# Patient Record
Sex: Male | Born: 1940 | Race: White | Hispanic: No | Marital: Married | Smoking: Current some day smoker
Health system: Southern US, Community
[De-identification: ages and names within clinical notes are randomized; demographics above are authoritative.]

## PROBLEM LIST (undated history)

## (undated) DIAGNOSIS — Z720 Tobacco use: Secondary | ICD-10-CM

## (undated) DIAGNOSIS — G4730 Sleep apnea, unspecified: Secondary | ICD-10-CM

## (undated) DIAGNOSIS — Z951 Presence of aortocoronary bypass graft: Secondary | ICD-10-CM

## (undated) DIAGNOSIS — E119 Type 2 diabetes mellitus without complications: Secondary | ICD-10-CM

## (undated) DIAGNOSIS — F17200 Nicotine dependence, unspecified, uncomplicated: Secondary | ICD-10-CM

## (undated) DIAGNOSIS — I10 Essential (primary) hypertension: Secondary | ICD-10-CM

## (undated) DIAGNOSIS — C801 Malignant (primary) neoplasm, unspecified: Secondary | ICD-10-CM

## (undated) DIAGNOSIS — I480 Paroxysmal atrial fibrillation: Secondary | ICD-10-CM

## (undated) DIAGNOSIS — I4510 Unspecified right bundle-branch block: Secondary | ICD-10-CM

## (undated) HISTORY — DX: Unspecified right bundle-branch block: I45.10

## (undated) HISTORY — DX: Paroxysmal atrial fibrillation: I48.0

---

## 2007-05-21 ENCOUNTER — Ambulatory Visit (HOSPITAL_COMMUNITY): Admission: RE | Admit: 2007-05-21 | Discharge: 2007-05-22 | Payer: Self-pay | Admitting: Ophthalmology

## 2010-09-27 NOTE — Op Note (Signed)
NAME:  CARR, SHARTZER              ACCOUNT NO.:  [id number]   MEDICAL RECORD NO.:  [id number]          PATIENT TYPE:  AMB   LOCATION:  SDS                          FACILITY:  [facility]   PHYSICIAN:  John D. Ashley Royalty, M.D. DATE OF BIRTH:  01-02-1941   DATE OF PROCEDURE:  05/21/2007  DATE OF DISCHARGE:                               OPERATIVE REPORT   ADMISSION DIAGNOSIS:  Rhegmatogenous retinal detachment, right eye.   PROCEDURE:  1. Scleral buckle, right eye.  2. Gas injection, right eye.  3. Panretinal photocoagulation, right eye.   SURGEON:  Alan Mulder, MD.   ASSISTANT:  Rosalie Doctor, MA.   ANESTHESIA:  General.   DETAILS:  Usual prep and drape, 360 degree limbal peritomy, isolation of  four rectus muscles on #2-0 silk.  Localization of break at 8:30  o'clock.  Scleral dissection for 360 degrees to admit a #279  intrascleral implant, diathermy placed in the bed.  The #279 implant was  placed against the globe.  Additional posterior dissection was carried  out at 8:30 o'clock.  The 240 band was placed around the globe with a  270 sleeve at 2 o'clock, the buckle was shortened.  Perforation starting  at 9 o'clock revealed a large amount of clear colorless subretinal fluid  coming forth.  The perfluoropropane 0.6 mL 100% was injected into the  vitreous cavity.  The buckle was adjusted and trimmed, the band was  adjusted and trimmed,  the scleral flaps were closed, the sutures  knotted and the free ends removed.  Indirect ophthalmoscopy showed the  retina to be lying nicely on the scleral buckle with the break well  approximated.  The indirect ophthalmoscope placed was moved into place,  540 burns placed around the retinal break and around the retinal  periphery with a power of 600 milliwatts, 1000 microns each and 0.1  seconds each.  The conjunctiva was reposited with #7-0 chromic suture,  polymyxin and gentamicin were irrigated into tenon space.  Atropine  solution was applied.   Marcaine was injected around the globe for postop  pain.  Decadron 10 mg was injected into the lower subconjunctival space.  TobraDex ophthalmic ointment, a patch and shield were placed.  Closing  pressure was 20 with a Barraquer tonometer.   COMPLICATIONS:  None.   DURATION:  2 hours.   The patient was awakened, taken to Recovery in satisfactory condition.      Beulah Gandy. Ashley Royalty, M.D.  Electronically Signed     JDM/MEDQ  D:  05/21/2007  T:  05/21/2007  Job:  [id number]

## 2011-02-02 LAB — CBC
HCT: 42.8
Hemoglobin: 14.6
MCHC: 34.2
MCV: 86.2
Platelets: 299
RBC: 4.97
RDW: 14.3
WBC: 11.1 — ABNORMAL HIGH

## 2011-02-02 LAB — BASIC METABOLIC PANEL
BUN: 12
CO2: 28
Calcium: 9.8
Chloride: 99
Creatinine, Ser: 0.89
GFR calc Af Amer: >60
GFR calc non Af Amer: >60
Glucose, Bld: 174 — ABNORMAL HIGH
Potassium: 4
Sodium: 135

## 2011-02-02 LAB — DIFFERENTIAL
Basophils Absolute: 0.1
Basophils Relative: 1
Eosinophils Absolute: 0.1
Eosinophils Relative: 1
Lymphocytes Relative: 20
Lymphs Abs: 2.3
Monocytes Absolute: 0.6
Monocytes Relative: 6
Neutro Abs: 8 — ABNORMAL HIGH
Neutrophils Relative %: 72

## 2011-02-24 ENCOUNTER — Encounter (INDEPENDENT_AMBULATORY_CARE_PROVIDER_SITE_OTHER): Payer: Medicare Other | Admitting: Ophthalmology

## 2011-02-24 DIAGNOSIS — H43819 Vitreous degeneration, unspecified eye: Secondary | ICD-10-CM

## 2011-02-24 DIAGNOSIS — H2510 Age-related nuclear cataract, unspecified eye: Secondary | ICD-10-CM

## 2011-02-24 DIAGNOSIS — H33009 Unspecified retinal detachment with retinal break, unspecified eye: Secondary | ICD-10-CM

## 2012-02-26 ENCOUNTER — Encounter (INDEPENDENT_AMBULATORY_CARE_PROVIDER_SITE_OTHER): Payer: Medicare Other | Admitting: Ophthalmology

## 2012-02-26 DIAGNOSIS — H33009 Unspecified retinal detachment with retinal break, unspecified eye: Secondary | ICD-10-CM

## 2012-02-26 DIAGNOSIS — H43819 Vitreous degeneration, unspecified eye: Secondary | ICD-10-CM

## 2013-02-20 ENCOUNTER — Ambulatory Visit (INDEPENDENT_AMBULATORY_CARE_PROVIDER_SITE_OTHER): Payer: Self-pay | Admitting: Ophthalmology

## 2013-02-26 ENCOUNTER — Ambulatory Visit (INDEPENDENT_AMBULATORY_CARE_PROVIDER_SITE_OTHER): Payer: Medicare Other | Admitting: Ophthalmology

## 2017-03-28 ENCOUNTER — Inpatient Hospital Stay (HOSPITAL_COMMUNITY)
Admission: EM | Admit: 2017-03-28 | Discharge: 2017-04-04 | DRG: 234 | Disposition: A | Discharge status: Home / Self Care | Payer: Medicare Other | Attending: Thoracic Surgery (Cardiothoracic Vascular Surgery) | Admitting: Thoracic Surgery (Cardiothoracic Vascular Surgery)

## 2017-03-28 ENCOUNTER — Other Ambulatory Visit: Payer: Self-pay

## 2017-03-28 ENCOUNTER — Emergency Department (HOSPITAL_COMMUNITY): Payer: Medicare Other

## 2017-03-28 DIAGNOSIS — E785 Hyperlipidemia, unspecified: Secondary | ICD-10-CM | POA: Diagnosis present

## 2017-03-28 DIAGNOSIS — D62 Acute posthemorrhagic anemia: Secondary | ICD-10-CM | POA: Diagnosis not present

## 2017-03-28 DIAGNOSIS — Z7984 Long term (current) use of oral hypoglycemic drugs: Secondary | ICD-10-CM

## 2017-03-28 DIAGNOSIS — I208 Other forms of angina pectoris: Secondary | ICD-10-CM | POA: Diagnosis not present

## 2017-03-28 DIAGNOSIS — R079 Chest pain, unspecified: Secondary | ICD-10-CM | POA: Diagnosis present

## 2017-03-28 DIAGNOSIS — I493 Ventricular premature depolarization: Secondary | ICD-10-CM | POA: Diagnosis not present

## 2017-03-28 DIAGNOSIS — F17210 Nicotine dependence, cigarettes, uncomplicated: Secondary | ICD-10-CM | POA: Diagnosis present

## 2017-03-28 DIAGNOSIS — I4891 Unspecified atrial fibrillation: Secondary | ICD-10-CM | POA: Diagnosis not present

## 2017-03-28 DIAGNOSIS — E119 Type 2 diabetes mellitus without complications: Secondary | ICD-10-CM | POA: Diagnosis not present

## 2017-03-28 DIAGNOSIS — E139 Other specified diabetes mellitus without complications: Secondary | ICD-10-CM | POA: Diagnosis present

## 2017-03-28 DIAGNOSIS — I25119 Atherosclerotic heart disease of native coronary artery with unspecified angina pectoris: Secondary | ICD-10-CM | POA: Diagnosis present

## 2017-03-28 DIAGNOSIS — I10 Essential (primary) hypertension: Secondary | ICD-10-CM | POA: Insufficient documentation

## 2017-03-28 DIAGNOSIS — Z8673 Personal history of transient ischemic attack (TIA), and cerebral infarction without residual deficits: Secondary | ICD-10-CM

## 2017-03-28 DIAGNOSIS — J9811 Atelectasis: Secondary | ICD-10-CM | POA: Diagnosis not present

## 2017-03-28 DIAGNOSIS — I25110 Atherosclerotic heart disease of native coronary artery with unstable angina pectoris: Secondary | ICD-10-CM | POA: Diagnosis not present

## 2017-03-28 DIAGNOSIS — I214 Non-ST elevation (NSTEMI) myocardial infarction: Secondary | ICD-10-CM | POA: Diagnosis present

## 2017-03-28 DIAGNOSIS — E876 Hypokalemia: Secondary | ICD-10-CM | POA: Diagnosis not present

## 2017-03-28 DIAGNOSIS — Z720 Tobacco use: Secondary | ICD-10-CM | POA: Diagnosis present

## 2017-03-28 DIAGNOSIS — Z79899 Other long term (current) drug therapy: Secondary | ICD-10-CM | POA: Diagnosis not present

## 2017-03-28 DIAGNOSIS — Z951 Presence of aortocoronary bypass graft: Secondary | ICD-10-CM

## 2017-03-28 DIAGNOSIS — E8770 Fluid overload, unspecified: Secondary | ICD-10-CM | POA: Diagnosis not present

## 2017-03-28 HISTORY — DX: Type 2 diabetes mellitus without complications: E11.9

## 2017-03-28 HISTORY — DX: Presence of aortocoronary bypass graft: Z95.1

## 2017-03-28 HISTORY — DX: Essential (primary) hypertension: I10

## 2017-03-28 HISTORY — DX: Tobacco use: Z72.0

## 2017-03-28 HISTORY — DX: Nicotine dependence, unspecified, uncomplicated: F17.200

## 2017-03-28 LAB — CBC
HCT: 42.5 % (ref 39.0–52.0)
HCT: 41.3 % (ref 39.0–52.0)
Hemoglobin: 14.6 g/dL (ref 13.0–17.0)
Hemoglobin: 13.8 g/dL (ref 13.0–17.0)
MCH: 29.8 pg (ref 26.0–34.0)
MCH: 29 pg (ref 26.0–34.0)
MCHC: 34.4 g/dL (ref 30.0–36.0)
MCHC: 33.4 g/dL (ref 30.0–36.0)
MCV: 86.7 fL (ref 78.0–100.0)
MCV: 86.8 fL (ref 78.0–100.0)
Platelets: 219 10*3/uL (ref 150–400)
Platelets: 221 10*3/uL (ref 150–400)
RBC: 4.9 MIL/uL (ref 4.22–5.81)
RBC: 4.76 MIL/uL (ref 4.22–5.81)
RDW: 15.3 % (ref 11.5–15.5)
RDW: 15 % (ref 11.5–15.5)
WBC: 10.8 10*3/uL — ABNORMAL HIGH (ref 4.0–10.5)
WBC: 10.1 10*3/uL (ref 4.0–10.5)

## 2017-03-28 LAB — I-STAT TROPONIN, ED
Troponin i, poc: 0.12 ng/mL (ref 0.00–0.08)
Troponin i, poc: 0.98 ng/mL (ref 0.00–0.08)

## 2017-03-28 LAB — CK TOTAL AND CKMB (NOT AT ARMC)
CK, MB: 18.4 ng/mL — ABNORMAL HIGH (ref 0.5–5.0)
Relative Index: 9.6 — ABNORMAL HIGH (ref 0.0–2.5)
Total CK: 191 U/L (ref 49–397)

## 2017-03-28 LAB — BASIC METABOLIC PANEL
Anion gap: 9 (ref 5–15)
BUN: 9 mg/dL (ref 6–20)
CO2: 25 mmol/L (ref 22–32)
Calcium: 9.1 mg/dL (ref 8.9–10.3)
Chloride: 100 mmol/L — ABNORMAL LOW (ref 101–111)
Creatinine, Ser: 0.87 mg/dL (ref 0.61–1.24)
GFR calc Af Amer: >60 mL/min (ref >60)
GFR calc non Af Amer: >60 mL/min (ref >60)
Glucose, Bld: 235 mg/dL — ABNORMAL HIGH (ref 65–99)
Potassium: 3.8 mmol/L (ref 3.5–5.1)
Sodium: 134 mmol/L — ABNORMAL LOW (ref 135–145)

## 2017-03-28 LAB — HEPATIC FUNCTION PANEL
ALT: 17 U/L (ref 17–63)
AST: 26 U/L (ref 15–41)
Albumin: 3.8 g/dL (ref 3.5–5.0)
Alkaline Phosphatase: 81 U/L (ref 38–126)
Bilirubin, Direct: <0.1 mg/dL — ABNORMAL LOW (ref 0.1–0.5)
Total Bilirubin: 0.5 mg/dL (ref 0.3–1.2)
Total Protein: 7.5 g/dL (ref 6.5–8.1)

## 2017-03-28 LAB — BRAIN NATRIURETIC PEPTIDE: B Natriuretic Peptide: 75.4 pg/mL (ref 0.0–100.0)

## 2017-03-28 MED ORDER — ACETAMINOPHEN 325 MG PO TABS: 650.0000 mg | ORAL_TABLET | ORAL | Status: DC | PRN

## 2017-03-28 MED ORDER — ASPIRIN 81 MG PO CHEW: 324.0000 mg | CHEWABLE_TABLET | Freq: Once | ORAL | Status: DC

## 2017-03-28 MED ORDER — HEPARIN BOLUS VIA INFUSION
4000.0000 [IU] | Freq: Once | INTRAVENOUS | Status: AC
  Administered 2017-03-28: 4000 [IU] via INTRAVENOUS
  Filled 2017-03-28: qty 4000

## 2017-03-28 MED ORDER — ASPIRIN 81 MG PO CHEW: 324.0000 mg | CHEWABLE_TABLET | ORAL | Status: DC

## 2017-03-28 MED ORDER — ASPIRIN EC 81 MG PO TBEC: 81.0000 mg | DELAYED_RELEASE_TABLET | Freq: Every day | ORAL | Status: DC

## 2017-03-28 MED ORDER — NITROGLYCERIN 0.4 MG SL SUBL: 0.4000 mg | SUBLINGUAL_TABLET | SUBLINGUAL | Status: DC | PRN

## 2017-03-28 MED ORDER — HEPARIN (PORCINE) IN NACL 100-0.45 UNIT/ML-% IJ SOLN: 16.0000 [IU]/kg/h | INTRAMUSCULAR | Status: DC

## 2017-03-28 MED ORDER — HEPARIN (PORCINE) IN NACL 100-0.45 UNIT/ML-% IJ SOLN
1100.0000 [IU]/h | INTRAMUSCULAR | Status: DC
  Administered 2017-03-28: 1200 [IU]/h via INTRAVENOUS
  Filled 2017-03-28 (×2): qty 250

## 2017-03-28 MED ORDER — ONDANSETRON HCL 4 MG/2ML IJ SOLN: 4.0000 mg | Freq: Four times a day (QID) | INTRAMUSCULAR | Status: DC | PRN

## 2017-03-28 MED ORDER — ASPIRIN 300 MG RE SUPP: 300.0000 mg | RECTAL | Status: DC

## 2017-03-28 NOTE — H&P (Signed)
Cardiology Admission History and Physical:   Patient ID: Bryce Powell; MRN: [id number]; DOB: 06-13-1940   Admission date: 03/28/2017  Primary Care Provider: Suszanne Conners, MD/[facility]  Primary Cardiologist: No primary care provider on file.  Primary Electrophysiologist:  none  Chief Complaint:  CHEST PAIN , ANGINA  Patient Profile:   Bryce Powell is a 76 y.o. male with a history of  DM, HTN , Dyslipidemia, No h/o CAD or PCI in the  Past.   History of Present Illness:   Bryce Powell was  Exerting in the  [employer] [place]  [employer], when he  Started  Having severe chest  Pain, aching  And  Tightness  In quality  , sternal in  Location. Radiating to neck. No c/o  Dyspnea or  Syncope No palpitations His pain was  Present  enroute to ED  And  Was  resolved  With Aspirin . No nitroglycerin was  Given  Initial Point of care troponin  Was 0.12 EKG  Showed  NSR , ST  depressions in  V4 V5. GRACE  Score 100, High  Risk    No past medical history on file. PMH: HTN,   No past surgical history on file. None.  Medications Prior to Admission: Prior to Admission medications   Medication Sig Start Date End Date Taking? Authorizing Provider  amLODipine (NORVASC) 5 MG tablet Take 5 mg daily by mouth.   Yes [provider]  aspirin EC 81 MG tablet Take 81 mg daily by mouth.   Yes [provider]  glipiZIDE (GLUCOTROL) 5 MG tablet Take 5 mg daily by mouth.   Yes [provider]  losartan (COZAAR) 50 MG tablet Take 50 mg daily by mouth.   Yes [provider]  metFORMIN (GLUCOPHAGE) 1000 MG tablet Take 1,000 mg 2 (two) times daily by mouth. 03/01/17  Yes [provider]     Allergies:   No Known Allergies  Social History:   Social History   Socioeconomic History  . Marital status: Married    Spouse name: Not on file  . Number of children: Not on file  . Years of education: Not on file  . Highest education level: Not on  file  Social Needs  . Financial resource strain: Not on file  . Food insecurity - worry: Not on file  . Food insecurity - inability: Not on file  . Transportation needs - medical: Not on file  . Transportation needs - non-medical: Not on file  Occupational History  . Not on file  Tobacco Use  . Smoking status: Not on file  Substance and Sexual Activity  . Alcohol use: Not on file  . Drug use: Not on file  . Sexual activity: Not on file  Other Topics Concern  . Not on file  Social History Narrative  . Not on file    Family History:   The patient's family history is not on file.    ROS:  Please see the history of present illness.  All other ROS reviewed and negative.     Physical Exam/Data:   Vitals:   03/28/17 2030 03/28/17 2115 03/28/17 2145 03/28/17 2230  BP: (!) 155/77 (!) 144/66 (!) 168/75 (!) 155/80  Pulse: 81 73 69   Resp: (!) 21 17 17 19   Temp:      TempSrc:      SpO2: 92% 95% 96%   Weight:      Height:  No intake or output data in the 24 hours ending 03/28/17 2238 Filed Weights   03/28/17 2007  Weight: 222 lb (100.7 kg)   Body mass index is 30.96 kg/m.  General:  Well nourished, well developed, in no acute distress HEENT: normal Lymph: no adenopathy Neck: no INCREASE in  JVD Endocrine:  No thryomegaly Vascular: No carotid bruits; FA pulses 2+ bilaterally without bruits  Cardiac:  normal S1, S2; RRR; MR  Murmur  1+ NOTED at  Left  Sternal border Lungs:  clear to auscultation bilaterally, no wheezing, rhonchi or rales  Abd: soft, nontender, no hepatomegaly  Ext: no  edema Musculoskeletal:  No deformities, BUE and BLE strength normal and equal Skin: warm and dry  Neuro:  CNs 2-12 intact, no focal abnormalities noted Psych:  Normal affect  Mildly diminished breath sounds bilateral bases.  Abdominal: Soft. Bowel sounds are normal. There is no tenderness. There is no rebound and no guarding.  Musculoskeletal: Normal range of motion. He exhibits no  edema or tenderness.  1+ bilateral lower extremity pitting edema.  No midline thoracic or lumbar tenderness.  No CVA tenderness    EKG:  The ECG that was done at  ED  was personally reviewed and demonstrates NSR, ST  DEPRESSIONS IN V4 V5  Relevant CV Studies: EKG  Repeat  Ordered   Laboratory Data:  Chemistry Recent Labs  Lab 03/28/17 2007  NA 134*  K 3.8  CL 100*  CO2 25  GLUCOSE 235*  BUN 9  CREATININE 0.87  CALCIUM 9.1  GFRNONAA >60  GFRAA >60  ANIONGAP 9    No results for input(s): PROT, ALBUMIN, AST, ALT, ALKPHOS, BILITOT in the last 168 hours. Hematology Recent Labs  Lab 03/28/17 2007  WBC 10.8*  RBC 4.90  HGB 14.6  HCT 42.5  MCV 86.7  MCH 29.8  MCHC 34.4  RDW 15.3  PLT 219   Cardiac EnzymesNo results for input(s): TROPONINI in the last 168 hours.  Recent Labs  Lab 03/28/17 2027  TROPIPOC 0.12*    BNP Recent Labs  Lab 03/28/17 2007  BNP 75.4    DDimer No results for input(s): DDIMER in the last 168 hours.  Radiology/Studies:  Dg Chest 2 View  Result Date: 03/28/2017 CLINICAL DATA:  Shortness of breath and chest pain. EXAM: CHEST  2 VIEW COMPARISON:  None. FINDINGS: Cardiomediastinal silhouette is normal. Mediastinal contours appear intact. Calcific atherosclerotic disease and tortuosity of the aorta. There is no evidence of focal airspace consolidation, pleural effusion or pneumothorax. Osseous structures are without acute abnormality. Soft tissues are grossly normal. IMPRESSION: Calcific atherosclerotic disease and tortuosity of the aorta. No pulmonary consolidation or edema. Electronically Signed   By: Fidela Salisbury M.D.   On: 03/28/2017 21:11    Assessment and Plan:   Bryce Powell is a 76 y.o. male with a history of  DM, HTN , Dyslipidemia, No h/o CAD or PCI in the  Past.   1. NSTEMI Initial troponin at 0.12P.  Troponin  Trended up to 1.02 ASA, 324mg , Hep gtt ACS, pharmacy to dose EKG- NSR, ST depressiosn in V4  Subtle non  specific St  Elevation in Inferior  Leads NPO, Nitro S/L PRN ,  Will plan for  Cardiac cath in AM    2. HTN: Uncontrolled,           Added Bet[place]ocker  Therapy , If  Needed  Will use   Hydralazine 10 mg IV PRN   3. Diabetes Mellitus  Uncontrolled, Home  Meds, Metformin held    SS  Sliding  Scale initiated  Severity of Illness: The appropriate patient status for this patient is OBSERVATION. Observation status is judged to be reasonable and necessary in order to provide the required intensity of service to ensure the patient's safety. The patient's presenting symptoms, physical exam findings, and initial radiographic and laboratory data in the context of their medical condition is felt to place them at decreased risk for further clinical deterioration. Furthermore, it is anticipated that the patient will be medically stable for discharge from the hospital within 2 midnights of admission. The following factors support the patient status of observation.   " The patient's presenting symptoms include  angina. " " The initial ekg and laboratory data are  ST   Depressions,  And Elevated  cardaic  enzymes     For questions or updates, please contact [place] Please consult [email or web address] for contact info under Cardiology/STEMI.    Signed, Johna Sheriff, MD  03/28/2017 10:38 PM

## 2017-03-28 NOTE — ED Notes (Signed)
Verified heparin with chris, rn

## 2017-03-28 NOTE — Progress Notes (Signed)
ANTICOAGULATION CONSULT NOTE - Initial Consult  Pharmacy Consult for heparin Indication: chest pain/ACS  No Known Allergies  Patient Measurements: Height: 5\' 11"  (180.3 cm) Weight: 222 lb (100.7 kg) IBW/kg (Calculated) : 75.3 Heparin Dosing Weight: 96.1kg  Vital Signs: Temp: 98.8 F (37.1 C) (11/14 2009) Temp Source: Oral (11/14 2009) BP: 168/75 (11/14 2145) Pulse Rate: 69 (11/14 2145)  Labs: Recent Labs    03/28/17 2007  HGB 14.6  HCT 42.5  PLT 219  CREATININE 0.87    Estimated Creatinine Clearance: 88.7 mL/min (by C-G formula based on SCr of 0.87 mg/dL).   Medical History: No past medical history on file.  Medications:  Infusions:  . heparin      Assessment: 34 yom presented to the ED with CP and SOB. Troponin elevated and now starting IV heparin. Baseline H/H and platelets are WNL. He is not on anticoagulation PTA.   Goal of Therapy:  Heparin level 0.3-0.7 units/ml Monitor platelets by anticoagulation protocol: Yes   Plan:  Heparin bolus 4000 units IV x 1 Heparin gtt 1200 units/hr Check an 8 hr heparin level Daily heparin level and CBC  Jaycelynn Knickerbocker, Rande Lawman 03/28/2017,10:27 PM

## 2017-03-28 NOTE — ED Notes (Signed)
Patient transported to X-ray 

## 2017-03-28 NOTE — ED Triage Notes (Signed)
Per ems pt was at home today s[litting wood and felt some SOB and felt some pain in his chest. He called 911 after about an hour and then said by the time ems arrived he had no pain. Ems gave 324 of aspirin, 500 of fluid. cbg 299 154/84, 88 hr, 16 rr, 97% ra.

## 2017-03-28 NOTE — Assessment & Plan Note (Signed)
Initial troponin at 0.12 EKG- NSR, ST depressiosn in V4  Subtle non specific St  Elevation in Inferior  Leads Heparin gtt NPO, Nitro S/L PRN ,  Will plan for  Cardiac cath in AM

## 2017-03-28 NOTE — ED Provider Notes (Signed)
[facility] CARDIOVASCULAR ICU Provider Note   CSN: [id number] Arrival date & time: 03/28/17  1957     History   Chief Complaint Chief Complaint  Patient presents with  . Chest Pain    HPI Bryce Powell is a 76 y.o. male.  HPI Patient presents with central chest tightness and shortness of breath after splitting wood today.  He called EMS but chest tightness had resolved by the time they arrived.  Was given aspirin in route.  States he has had congestion and productive cough for the last few days.  Denies any fever or chills.  No new lower extremity swelling or pain. Past Medical History:  Diagnosis Date  . Current smoker   . Diabetes ([place])   . HTN (hypertension)     Patient Active Problem List   Diagnosis Date Noted  . NSTEMI (non-ST elevated myocardial infarction) ([place]) 03/28/2017  . HTN (hypertension) 03/28/2017  . 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 Angina at rest [facility]) 03/28/2017    No past surgical history on file.     Home Medications    Prior to Admission medications   Medication Sig Start Date End Date Taking? Authorizing Provider  amLODipine (NORVASC) 5 MG tablet Take 5 mg daily by mouth.   Yes [provider]  aspirin EC 81 MG tablet Take 81 mg daily by mouth.   Yes [provider]  glipiZIDE (GLUCOTROL) 5 MG tablet Take 5 mg daily by mouth.   Yes [provider]  losartan (COZAAR) 50 MG tablet Take 50 mg daily by mouth.   Yes [provider]  metFORMIN (GLUCOPHAGE) 1000 MG tablet Take 1,000 mg 2 (two) times daily by mouth. 03/01/17  Yes [provider]    Family History No family history on file.  Social History Social History   Tobacco Use  . Smoking status: Not on file  Substance Use Topics  . Alcohol use: Not on file  . Drug use: Not on file     Allergies   Patient has no known allergies.   Review of Systems Review of Systems  Constitutional: Negative for chills and  fever.  HENT: Positive for congestion. Negative for sore throat and trouble swallowing.   Respiratory: Positive for cough, chest tightness and shortness of breath.   Cardiovascular: Positive for chest pain. Negative for palpitations and leg swelling.  Gastrointestinal: Negative for abdominal pain, constipation, diarrhea, nausea and vomiting.  Genitourinary: Negative for dysuria, flank pain and frequency.  Musculoskeletal: Negative for back pain, myalgias, neck pain and neck stiffness.  Skin: Negative for rash and wound.  Neurological: Negative for dizziness, weakness, light-headedness, numbness and headaches.  All other systems reviewed and are negative.    Physical Exam Updated Vital Signs BP (!) 152/66   Pulse 63   Temp 97.6 F (36.4 C) (Oral)   Resp 14   Ht 5\' 11"  (1.803 m)   Wt 100.7 kg (222 lb)   SpO2 93%   BMI 30.96 kg/m   Physical Exam  Constitutional: He is oriented to person, place, and time. He appears well-developed and well-nourished.  Non-toxic appearance. He does not appear ill. No distress.  HENT:  Head: Normocephalic and atraumatic.  Mouth/Throat: Oropharynx is clear and moist.  Eyes: EOM are normal. Pupils are equal, round, and reactive to light.  Neck: Normal range of motion. Neck supple.  Cardiovascular: Normal rate and regular rhythm.  Pulmonary/Chest: Effort normal.  Mildly diminished breath sounds bilateral bases.  Abdominal: Soft. Bowel sounds are normal. There is no tenderness. There is no rebound and no guarding.  Musculoskeletal: Normal range of motion. He exhibits no edema or tenderness.  1+ bilateral lower extremity pitting edema.  No midline thoracic or lumbar tenderness.  No CVA tenderness.    Neurological: He is alert and oriented to person, place, and time.  Skin: Skin is warm and dry. No rash noted. No erythema.  Psychiatric: He has a normal mood and affect. His behavior is normal.  Nursing note and vitals reviewed.    ED Treatments /  Results  Labs (all labs ordered are listed, but only abnormal results are displayed) Labs Reviewed  BASIC METABOLIC PANEL - Abnormal; Notable for the following components:      Result Value   Sodium 134 (*)    Chloride 100 (*)    Glucose, Bld 235 (*)    All other components within normal limits  CBC - Abnormal; Notable for the following components:   WBC 10.8 (*)    All other components within normal limits  HEPATIC FUNCTION PANEL - Abnormal; Notable for the following components:   Bilirubin, Direct <0.1 (*)    All other components within normal limits  TROPONIN I - Abnormal; Notable for the following components:   Troponin I 0.88 (*)    All other components within normal limits  CK TOTAL AND CKMB (NOT AT [facility]) - Abnormal; Notable for the following components:   CK, MB 18.4 (*)    Relative Index 9.6 (*)    All other components within normal limits  HEPARIN LEVEL (UNFRACTIONATED) - Abnormal; Notable for the following components:   Heparin Unfractionated 0.71 (*)    All other components within normal limits  BASIC METABOLIC PANEL - Abnormal; Notable for the following components:   Glucose, Bld 182 (*)    All other components within normal limits  TROPONIN I - Abnormal; Notable for the following components:   Troponin I 1.04 (*)    All other components within normal limits  TROPONIN I - Abnormal; Notable for the following components:   Troponin I 1.96 (*)    All other components within normal limits  TROPONIN I - Abnormal; Notable for the following components:   Troponin I 1.42 (*)    All other components within normal limits  GLUCOSE, CAPILLARY - Abnormal; Notable for the following components:   Glucose-Capillary 164 (*)    All other components within normal limits  COMPREHENSIVE METABOLIC PANEL - Abnormal; Notable for the following components:   Glucose, Bld 240 (*)    ALT 16 (*)    All other components within normal limits  HEMOGLOBIN A1C - Abnormal; Notable for the  following components:   Hgb A1c MFr Bld 8.6 (*)    All other components within normal limits  I-STAT TROPONIN, ED - Abnormal; Notable for the following components:   Troponin i, poc 0.12 (*)    All other components within normal limits  CBG MONITORING, ED - Abnormal; Notable for the following components:   Glucose-Capillary 204 (*)    All other components within normal limits  I-STAT TROPONIN, ED - Abnormal; Notable for the following components:   Troponin i, poc 0.98 (*)    All other components within normal limits  SURGICAL PCR SCREEN  BRAIN NATRIURETIC PEPTIDE  CBC  CBC  PROTIME-INR  BLOOD GAS, ARTERIAL  CBC  LIPID PANEL  HEMOGLOBIN A1C  APTT  PROTIME-INR  BASIC METABOLIC PANEL  I-STAT TROPONIN, ED  TYPE AND SCREEN  ABO/RH    EKG  EKG Interpretation  Date/Time:  Wednesday March 28 2017 64:40:34 EST Ventricular Rate:  92 PR Interval:    QRS Duration: 107 QT Interval:  382 QTC Calculation: 473 R Axis:   96 Text Interpretation:  Sinus rhythm Atrial premature complexes Right axis deviation Borderline ST depression, anterolateral leads Confirmed by Julianne Rice [phone]) on 03/28/2017 8:32:42 PM Also confirmed by Julianne Rice [phone]), editor Hattie Perch [phone])  on 03/29/2017 7:08:53 AM       Radiology Dg Chest 2 View  Result Date: 03/28/2017 CLINICAL DATA:  Shortness of breath and chest pain. EXAM: CHEST  2 VIEW COMPARISON:  None. FINDINGS: Cardiomediastinal silhouette is normal. Mediastinal contours appear intact. Calcific atherosclerotic disease and tortuosity of the aorta. There is no evidence of focal airspace consolidation, pleural effusion or pneumothorax. Osseous structures are without acute abnormality. Soft tissues are grossly normal. IMPRESSION: Calcific atherosclerotic disease and tortuosity of the aorta. No pulmonary consolidation or edema. Electronically Signed   By: Fidela Salisbury M.D.   On: 03/28/2017 21:11   Dg Chest Port 1  View  Result Date: 03/29/2017 CLINICAL DATA:  76 year old male with chest pain. EXAM: PORTABLE CHEST 1 VIEW COMPARISON:  Chest radiograph dated 03/28/2017 FINDINGS: The lungs are clear. There is no pleural effusion or pneumothorax. There is borderline cardiomegaly. There is mildly tortuous thoracic aorta with atherosclerotic calcification. No acute osseous pathology. IMPRESSION: 1. No acute cardiopulmonary process. 2. Borderline cardiomegaly. Electronically Signed   By: Anner Crete M.D.   On: 03/29/2017 19:01    Procedures Procedures (including critical care time)  Medications Ordered in ED Medications  amLODipine (NORVASC) tablet 5 mg ( Oral MAR Unhold 03/29/17 1812)  losartan (COZAAR) tablet 50 mg ( Oral MAR Unhold 03/29/17 1812)  0.9 %  sodium chloride infusion ( Intravenous New Bag/Given 03/29/17 0224)  nitroGLYCERIN (NITROSTAT) SL tablet 0.4 mg ( Sublingual MAR Unhold 03/29/17 1812)  insulin aspart (novoLOG) injection 0-15 Units ( Subcutaneous MAR Unhold 03/29/17 1812)  acetaminophen (TYLENOL) tablet 650 mg (not administered)  ondansetron (ZOFRAN) injection 4 mg (not administered)  0.9 %  sodium chloride infusion ( Intravenous Rate/Dose Change 03/29/17 1500)  sodium chloride flush (NS) 0.9 % injection 3 mL (3 mLs Intravenous Not Given 03/29/17 2033)  sodium chloride flush (NS) 0.9 % injection 3 mL (not administered)  0.9 %  sodium chloride infusion (not administered)  morphine 4 MG/ML injection 2 mg (not administered)  atorvastatin (LIPITOR) tablet 80 mg (not administered)  aspirin chewable tablet 81 mg (not administered)  chlorhexidine (PERIDEX) 0.12 % solution 15 mL (not administered)  bisacodyl (DULCOLAX) EC tablet 5 mg (5 mg Oral Not Given 03/29/17 2031)  temazepam (RESTORIL) capsule 15 mg (not administered)  metoprolol tartrate (LOPRESSOR) tablet 12.5 mg (not administered)  chlorhexidine (HIBICLENS) 4 % liquid 4 application (not administered)    And  chlorhexidine  (HIBICLENS) 4 % liquid 4 application (not administered)  heparin ADULT infusion 100 units/mL (25000 units/285mL sodium chloride 0.45%) (1,100 Units/hr Intravenous New Bag/Given 03/29/17 2023)  dexmedetomidine (PRECEDEX) 400 MCG/100ML (4 mcg/mL) infusion (not administered)  insulin regular (NOVOLIN R,HUMULIN R) 100 Units in sodium chloride 0.9 % 100 mL (1 Units/mL) infusion (not administered)  EPINEPHrine (ADRENALIN) 4 mg in dextrose 5 % 250 mL (0.016 mg/mL) infusion (not administered)  DOPamine (INTROPIN) 800 mg in dextrose 5 % 250 mL (3.2 mg/mL) infusion (not administered)  nitroGLYCERIN 50 mg in dextrose 5 % 250 mL (0.2 mg/mL) infusion (not administered)  phenylephrine (NEO-SYNEPHRINE) 20 mg in sodium chloride 0.9 % 250 mL (0.08 mg/mL) infusion (not administered)  heparin 2,500 Units, papaverine 30 mg in electrolyte-148 (PLASMALYTE-148) 500 mL irrigation (not administered)  heparin 30,000 units/NS 1000 mL solution for CELLSAVER (not administered)  potassium chloride injection 80 mEq (not administered)  magnesium sulfate (IV Push/IM) injection 40 mEq (not administered)  tranexamic acid (CYKLOKAPRON) pump prime solution 201 mg (not administered)  tranexamic acid (CYKLOKAPRON) bolus via infusion - over 30 minutes 1,510.5 mg (not administered)  tranexamic acid (CYKLOKAPRON) 2,500 mg in sodium chloride 0.9 % 250 mL (10 mg/mL) infusion (not administered)  vancomycin (VANCOCIN) 1,250 mg in sodium chloride 0.9 % 250 mL IVPB (not administered)  cefUROXime (ZINACEF) 1.5 g in dextrose 5 % 50 mL IVPB (not administered)  cefUROXime (ZINACEF) 750 mg in dextrose 5 % 50 mL IVPB (not administered)  heparin bolus via infusion 4,000 Units (4,000 Units Intravenous Bolus from Bag 03/28/17 2306)  aspirin chewable tablet 324 mg (324 mg Oral Given 03/29/17 0125)  metoprolol tartrate (LOPRESSOR) tablet 12.5 mg ( Oral MAR Unhold 03/29/17 1812)  heparin infusion 2 units/mL in 0.9 % sodium chloride (1,000 mLs Other New  Bag/Given 03/29/17 1426)  going for heart surgery book ( Does not apply Given 03/29/17 2028)     Initial Impression / Assessment and Plan / ED Course  I have reviewed the triage vital signs and the nursing notes.  Pertinent labs & imaging results that were available during my care of the patient were reviewed by me and considered in my medical decision making (see chart for details).     Patient remains chest pain-free.  Discussed with cardiology.  Will admit and plan for catheterization.  Far as starting heparin per pharmacy protocol.  Final Clinical Impressions(s) / ED Diagnoses   Final diagnoses:  Chest pain in adult    ED Discharge Orders        Ordered    AMB Referral to Cardiac Rehabilitation - Phase II     03/29/17 1429       Julianne Rice, MD 03/29/17 2142

## 2017-03-29 ENCOUNTER — Inpatient Hospital Stay (HOSPITAL_COMMUNITY): Payer: Medicare Other

## 2017-03-29 ENCOUNTER — Encounter (HOSPITAL_COMMUNITY)
Admission: EM | Disposition: A | Discharge status: Home / Self Care | Payer: Self-pay | Source: Home / Self Care | Attending: Thoracic Surgery (Cardiothoracic Vascular Surgery)

## 2017-03-29 ENCOUNTER — Encounter (HOSPITAL_COMMUNITY): Payer: Self-pay

## 2017-03-29 DIAGNOSIS — I208 Other forms of angina pectoris: Secondary | ICD-10-CM

## 2017-03-29 DIAGNOSIS — I25110 Atherosclerotic heart disease of native coronary artery with unstable angina pectoris: Secondary | ICD-10-CM

## 2017-03-29 DIAGNOSIS — E785 Hyperlipidemia, unspecified: Secondary | ICD-10-CM

## 2017-03-29 DIAGNOSIS — E119 Type 2 diabetes mellitus without complications: Secondary | ICD-10-CM

## 2017-03-29 DIAGNOSIS — I214 Non-ST elevation (NSTEMI) myocardial infarction: Principal | ICD-10-CM

## 2017-03-29 HISTORY — PX: LEFT HEART CATH AND CORONARY ANGIOGRAPHY: CATH118249

## 2017-03-29 LAB — BASIC METABOLIC PANEL
Anion gap: 9 (ref 5–15)
BUN: 10 mg/dL (ref 6–20)
CO2: 25 mmol/L (ref 22–32)
Calcium: 9.1 mg/dL (ref 8.9–10.3)
Chloride: 102 mmol/L (ref 101–111)
Creatinine, Ser: 0.9 mg/dL (ref 0.61–1.24)
GFR calc Af Amer: >60 mL/min (ref >60)
GFR calc non Af Amer: >60 mL/min (ref >60)
Glucose, Bld: 182 mg/dL — ABNORMAL HIGH (ref 65–99)
Potassium: 4 mmol/L (ref 3.5–5.1)
Sodium: 136 mmol/L (ref 135–145)

## 2017-03-29 LAB — CBC
HCT: 41.2 % (ref 39.0–52.0)
Hemoglobin: 13.8 g/dL (ref 13.0–17.0)
MCH: 28.9 pg (ref 26.0–34.0)
MCHC: 33.5 g/dL (ref 30.0–36.0)
MCV: 86.4 fL (ref 78.0–100.0)
Platelets: 203 10*3/uL (ref 150–400)
RBC: 4.77 MIL/uL (ref 4.22–5.81)
RDW: 15.1 % (ref 11.5–15.5)
WBC: 10 10*3/uL (ref 4.0–10.5)

## 2017-03-29 LAB — COMPREHENSIVE METABOLIC PANEL
ALT: 16 U/L — ABNORMAL LOW (ref 17–63)
AST: 24 U/L (ref 15–41)
Albumin: 3.5 g/dL (ref 3.5–5.0)
Alkaline Phosphatase: 71 U/L (ref 38–126)
Anion gap: 6 (ref 5–15)
BUN: 7 mg/dL (ref 6–20)
CO2: 27 mmol/L (ref 22–32)
Calcium: 8.9 mg/dL (ref 8.9–10.3)
Chloride: 103 mmol/L (ref 101–111)
Creatinine, Ser: 0.88 mg/dL (ref 0.61–1.24)
GFR calc Af Amer: >60 mL/min (ref >60)
GFR calc non Af Amer: >60 mL/min (ref >60)
Glucose, Bld: 240 mg/dL — ABNORMAL HIGH (ref 65–99)
Potassium: 3.7 mmol/L (ref 3.5–5.1)
Sodium: 136 mmol/L (ref 135–145)
Total Bilirubin: 0.4 mg/dL (ref 0.3–1.2)
Total Protein: 7 g/dL (ref 6.5–8.1)

## 2017-03-29 LAB — TYPE AND SCREEN
ABO/RH(D): O POS
Antibody Screen: NEGATIVE

## 2017-03-29 LAB — HEMOGLOBIN A1C
Hgb A1c MFr Bld: 8.6 % — ABNORMAL HIGH (ref 4.8–5.6)
Mean Plasma Glucose: 200.12 mg/dL

## 2017-03-29 LAB — GLUCOSE, CAPILLARY
Glucose-Capillary: 164 mg/dL — ABNORMAL HIGH (ref 65–99)
Glucose-Capillary: 237 mg/dL — ABNORMAL HIGH (ref 65–99)

## 2017-03-29 LAB — PROTIME-INR
INR: 1.1
Prothrombin Time: 14.1 seconds (ref 11.4–15.2)

## 2017-03-29 LAB — TROPONIN I
Troponin I: 0.88 ng/mL (ref <0.03)
Troponin I: 1.04 ng/mL (ref <0.03)
Troponin I: 1.96 ng/mL (ref <0.03)
Troponin I: 1.42 ng/mL (ref <0.03)

## 2017-03-29 LAB — HEPARIN LEVEL (UNFRACTIONATED): Heparin Unfractionated: 0.71 IU/mL — ABNORMAL HIGH (ref 0.30–0.70)

## 2017-03-29 LAB — ABO/RH: ABO/RH(D): O POS

## 2017-03-29 LAB — SURGICAL PCR SCREEN
MRSA, PCR: NEGATIVE
Staphylococcus aureus: POSITIVE — AB

## 2017-03-29 LAB — CBG MONITORING, ED: Glucose-Capillary: 204 mg/dL — ABNORMAL HIGH (ref 65–99)

## 2017-03-29 SURGERY — LEFT HEART CATH AND CORONARY ANGIOGRAPHY
Anesthesia: LOCAL

## 2017-03-29 MED ORDER — TRANEXAMIC ACID (OHS) PUMP PRIME SOLUTION
2.0000 mg/kg | INTRAVENOUS | Status: DC
  Filled 2017-03-29: qty 2.01

## 2017-03-29 MED ORDER — VERAPAMIL HCL 2.5 MG/ML IV SOLN
INTRAVENOUS | Status: AC
  Filled 2017-03-29: qty 2

## 2017-03-29 MED ORDER — ONDANSETRON HCL 4 MG/2ML IJ SOLN: 4.0000 mg | Freq: Four times a day (QID) | INTRAMUSCULAR | Status: DC | PRN

## 2017-03-29 MED ORDER — ~~LOC~~ CARDIAC SURGERY, PATIENT & FAMILY EDUCATION
Freq: Once | Status: AC
  Administered 2017-03-29: 20:00:00
  Filled 2017-03-29: qty 1

## 2017-03-29 MED ORDER — SODIUM CHLORIDE 0.9 % IV SOLN
INTRAVENOUS | Status: DC
  Administered 2017-03-29 – 2017-03-30 (×3): via INTRAVENOUS

## 2017-03-29 MED ORDER — HEPARIN SODIUM (PORCINE) 1000 UNIT/ML IJ SOLN
INTRAMUSCULAR | Status: DC | PRN
  Administered 2017-03-29: 5000 [IU] via INTRAVENOUS

## 2017-03-29 MED ORDER — ASPIRIN 81 MG PO CHEW
324.0000 mg | CHEWABLE_TABLET | Freq: Once | ORAL | Status: AC
  Administered 2017-03-29: 324 mg via ORAL
  Filled 2017-03-29: qty 4

## 2017-03-29 MED ORDER — ASPIRIN 81 MG PO CHEW: 81.0000 mg | CHEWABLE_TABLET | Freq: Every day | ORAL | Status: DC

## 2017-03-29 MED ORDER — HEPARIN (PORCINE) IN NACL 2-0.9 UNIT/ML-% IJ SOLN
INTRAMUSCULAR | Status: AC | PRN
  Administered 2017-03-29: 1000 mL

## 2017-03-29 MED ORDER — NITROGLYCERIN 1 MG/10 ML FOR IR/CATH LAB
INTRA_ARTERIAL | Status: AC
  Filled 2017-03-29: qty 10

## 2017-03-29 MED ORDER — CHLORHEXIDINE GLUCONATE 4 % EX LIQD
60.0000 mL | Freq: Once | CUTANEOUS | Status: AC
  Administered 2017-03-30: 4 via TOPICAL
  Filled 2017-03-29: qty 15

## 2017-03-29 MED ORDER — HEPARIN (PORCINE) IN NACL 100-0.45 UNIT/ML-% IJ SOLN
1100.0000 [IU]/h | INTRAMUSCULAR | Status: DC
  Administered 2017-03-29: 1100 [IU]/h via INTRAVENOUS

## 2017-03-29 MED ORDER — HEPARIN (PORCINE) IN NACL 2-0.9 UNIT/ML-% IJ SOLN
INTRAMUSCULAR | Status: AC
  Filled 2017-03-29: qty 1000

## 2017-03-29 MED ORDER — ASPIRIN 325 MG PO TABS: 325.0000 mg | ORAL_TABLET | Freq: Every day | ORAL | Status: DC

## 2017-03-29 MED ORDER — CHLORHEXIDINE GLUCONATE 0.12 % MT SOLN
15.0000 mL | Freq: Once | OROMUCOSAL | Status: AC
  Administered 2017-03-30: 15 mL via OROMUCOSAL
  Filled 2017-03-29: qty 15

## 2017-03-29 MED ORDER — LIDOCAINE HCL (PF) 1 % IJ SOLN
INTRAMUSCULAR | Status: DC | PRN
  Administered 2017-03-29: 2 mL

## 2017-03-29 MED ORDER — DOPAMINE-DEXTROSE 3.2-5 MG/ML-% IV SOLN
0.0000 ug/kg/min | INTRAVENOUS | Status: DC
  Filled 2017-03-29: qty 250

## 2017-03-29 MED ORDER — VERAPAMIL HCL 2.5 MG/ML IV SOLN
INTRAVENOUS | Status: DC | PRN
  Administered 2017-03-29: 10 mL via INTRA_ARTERIAL

## 2017-03-29 MED ORDER — AMLODIPINE BESYLATE 5 MG PO TABS
5.0000 mg | ORAL_TABLET | Freq: Every day | ORAL | Status: DC
  Administered 2017-03-29: 5 mg via ORAL
  Filled 2017-03-29: qty 1

## 2017-03-29 MED ORDER — SODIUM CHLORIDE 0.9% FLUSH
3.0000 mL | Freq: Two times a day (BID) | INTRAVENOUS | Status: DC
  Administered 2017-03-29: 3 mL via INTRAVENOUS

## 2017-03-29 MED ORDER — ATORVASTATIN CALCIUM 80 MG PO TABS: 80.0000 mg | ORAL_TABLET | Freq: Every day | ORAL | Status: DC

## 2017-03-29 MED ORDER — ATORVASTATIN CALCIUM 80 MG PO TABS
80.0000 mg | ORAL_TABLET | Freq: Every day | ORAL | Status: DC
  Filled 2017-03-29: qty 1

## 2017-03-29 MED ORDER — CEFUROXIME SODIUM 1.5 G IV SOLR
1.5000 g | INTRAVENOUS | Status: AC
  Administered 2017-03-30: 1.5 g via INTRAVENOUS
  Administered 2017-03-30: .75 g via INTRAVENOUS
  Filled 2017-03-29: qty 1.5

## 2017-03-29 MED ORDER — TRANEXAMIC ACID (OHS) BOLUS VIA INFUSION
15.0000 mg/kg | INTRAVENOUS | Status: AC
  Administered 2017-03-30: 1510.5 mg via INTRAVENOUS
  Filled 2017-03-29: qty 1511

## 2017-03-29 MED ORDER — HEPARIN SODIUM (PORCINE) 1000 UNIT/ML IJ SOLN
INTRAMUSCULAR | Status: AC
  Filled 2017-03-29: qty 1

## 2017-03-29 MED ORDER — SODIUM CHLORIDE 0.9% FLUSH: 3.0000 mL | INTRAVENOUS | Status: DC | PRN

## 2017-03-29 MED ORDER — SODIUM CHLORIDE 0.9 % IV SOLN
INTRAVENOUS | Status: DC
  Filled 2017-03-29: qty 30

## 2017-03-29 MED ORDER — NITROGLYCERIN 0.4 MG SL SUBL: 0.4000 mg | SUBLINGUAL_TABLET | SUBLINGUAL | Status: DC | PRN

## 2017-03-29 MED ORDER — SODIUM CHLORIDE 0.9 % IV SOLN: INTRAVENOUS | Status: AC

## 2017-03-29 MED ORDER — ASPIRIN EC 325 MG PO TBEC: 325.0000 mg | DELAYED_RELEASE_TABLET | Freq: Every day | ORAL | Status: DC

## 2017-03-29 MED ORDER — SODIUM CHLORIDE 0.9 % IV SOLN
30.0000 ug/min | INTRAVENOUS | Status: DC
  Filled 2017-03-29: qty 2

## 2017-03-29 MED ORDER — LIDOCAINE HCL (PF) 1 % IJ SOLN
INTRAMUSCULAR | Status: AC
  Filled 2017-03-29: qty 30

## 2017-03-29 MED ORDER — SODIUM CHLORIDE 0.9 % IV SOLN
INTRAVENOUS | Status: AC
  Administered 2017-03-30: 2.4 [IU]/h via INTRAVENOUS
  Filled 2017-03-29: qty 1

## 2017-03-29 MED ORDER — VANCOMYCIN HCL 10 G IV SOLR
1250.0000 mg | INTRAVENOUS | Status: AC
  Administered 2017-03-30: 1250 mg via INTRAVENOUS
  Filled 2017-03-29 (×2): qty 1250

## 2017-03-29 MED ORDER — SODIUM CHLORIDE 0.9% FLUSH: 3.0000 mL | Freq: Two times a day (BID) | INTRAVENOUS | Status: DC

## 2017-03-29 MED ORDER — BISACODYL 5 MG PO TBEC: 5.0000 mg | DELAYED_RELEASE_TABLET | Freq: Once | ORAL | Status: DC

## 2017-03-29 MED ORDER — METOPROLOL TARTRATE 25 MG PO TABS
12.5000 mg | ORAL_TABLET | Freq: Once | ORAL | Status: AC
  Administered 2017-03-29: 12.5 mg via ORAL
  Filled 2017-03-29: qty 1
  Filled 2017-03-29: qty 0.5

## 2017-03-29 MED ORDER — IOPAMIDOL (ISOVUE-370) INJECTION 76%
INTRAVENOUS | Status: DC | PRN
  Administered 2017-03-29: 50 mL via INTRA_ARTERIAL

## 2017-03-29 MED ORDER — EPINEPHRINE PF 1 MG/ML IJ SOLN
0.0000 ug/min | INTRAVENOUS | Status: DC
  Filled 2017-03-29: qty 4

## 2017-03-29 MED ORDER — ACETAMINOPHEN 325 MG PO TABS: 650.0000 mg | ORAL_TABLET | ORAL | Status: DC | PRN

## 2017-03-29 MED ORDER — POTASSIUM CHLORIDE 2 MEQ/ML IV SOLN
80.0000 meq | INTRAVENOUS | Status: DC
  Filled 2017-03-29: qty 40

## 2017-03-29 MED ORDER — MAGNESIUM SULFATE 50 % IJ SOLN
40.0000 meq | INTRAMUSCULAR | Status: DC
  Filled 2017-03-29: qty 9.85

## 2017-03-29 MED ORDER — LOSARTAN POTASSIUM 50 MG PO TABS
50.0000 mg | ORAL_TABLET | Freq: Every day | ORAL | Status: DC
  Administered 2017-03-29: 50 mg via ORAL
  Filled 2017-03-29 (×2): qty 1

## 2017-03-29 MED ORDER — CEFUROXIME SODIUM 750 MG IJ SOLR
750.0000 mg | INTRAMUSCULAR | Status: DC
  Filled 2017-03-29: qty 750

## 2017-03-29 MED ORDER — SODIUM CHLORIDE 0.9 % IV SOLN: 250.0000 mL | INTRAVENOUS | Status: DC | PRN

## 2017-03-29 MED ORDER — IOPAMIDOL (ISOVUE-370) INJECTION 76%
INTRAVENOUS | Status: AC
  Filled 2017-03-29: qty 100

## 2017-03-29 MED ORDER — PAPAVERINE HCL 30 MG/ML IJ SOLN
INTRAMUSCULAR | Status: AC
  Administered 2017-03-30: 500 mL
  Filled 2017-03-29: qty 2.5

## 2017-03-29 MED ORDER — METOPROLOL TARTRATE 25 MG PO TABS
25.0000 mg | ORAL_TABLET | Freq: Once | ORAL | Status: DC
  Filled 2017-03-29: qty 1

## 2017-03-29 MED ORDER — INSULIN ASPART 100 UNIT/ML ~~LOC~~ SOLN: 0.0000 [IU] | Freq: Three times a day (TID) | SUBCUTANEOUS | Status: DC

## 2017-03-29 MED ORDER — METOPROLOL TARTRATE 12.5 MG HALF TABLET
12.5000 mg | ORAL_TABLET | Freq: Once | ORAL | Status: AC
  Administered 2017-03-30: 12.5 mg via ORAL
  Filled 2017-03-29: qty 1

## 2017-03-29 MED ORDER — MORPHINE SULFATE (PF) 4 MG/ML IV SOLN: 2.0000 mg | INTRAVENOUS | Status: DC | PRN

## 2017-03-29 MED ORDER — TRANEXAMIC ACID 1000 MG/10ML IV SOLN
1.5000 mg/kg/h | INTRAVENOUS | Status: AC
  Administered 2017-03-30: 1.5 mg/kg/h via INTRAVENOUS
  Filled 2017-03-29: qty 25

## 2017-03-29 MED ORDER — SODIUM CHLORIDE 0.9 % WEIGHT BASED INFUSION
1.0000 mL/kg/h | INTRAVENOUS | Status: DC
  Administered 2017-03-29: 1 mL/kg/h via INTRAVENOUS

## 2017-03-29 MED ORDER — SODIUM CHLORIDE 0.9 % WEIGHT BASED INFUSION
3.0000 mL/kg/h | INTRAVENOUS | Status: DC
  Administered 2017-03-29: 3 mL/kg/h via INTRAVENOUS

## 2017-03-29 MED ORDER — HEPARIN (PORCINE) IN NACL 100-0.45 UNIT/ML-% IJ SOLN
1100.0000 [IU]/h | INTRAMUSCULAR | Status: DC
  Filled 2017-03-29: qty 250

## 2017-03-29 MED ORDER — DEXMEDETOMIDINE HCL IN NACL 400 MCG/100ML IV SOLN
0.1000 ug/kg/h | INTRAVENOUS | Status: AC
  Administered 2017-03-30: .2 ug/kg/h via INTRAVENOUS
  Filled 2017-03-29: qty 100

## 2017-03-29 MED ORDER — NITROGLYCERIN IN D5W 200-5 MCG/ML-% IV SOLN
2.0000 ug/min | INTRAVENOUS | Status: AC
  Administered 2017-03-30: 10 ug/min via INTRAVENOUS
  Filled 2017-03-29: qty 250

## 2017-03-29 MED ORDER — TEMAZEPAM 15 MG PO CAPS: 15.0000 mg | ORAL_CAPSULE | Freq: Once | ORAL | Status: DC | PRN

## 2017-03-29 SURGICAL SUPPLY — 12 items

## 2017-03-29 NOTE — Consult Note (Signed)
[address]       [place] [address]             [phone]        Dymir K[facility] [place] Medical Record #[id number] Date of Birth: 12/25/1940  Referring: Lorretta Harp, MD Primary Cardiologist: Belva Crome, MD (new) Primary Care: Suszanne Conners, MD  Chief Complaint:    Chief Complaint  Patient presents with  . Chest Pain    History of Present Illness: Mr. Bryce Powell is a 76 year old male patient with past medical history of hypertension, tobacco dependence (smoker for 60 years at 2 packs a day), diabetes mellitus type 1, and dyslipidemia who presented to the emergency department with severe chest pain which radiated to his neck. He has had shortness of breath for the last year but when he was checked out by a physician they told him he was heathy.  He has had no palpitations associated with pain.  He was splitting wood yesterday when the chest tightness occurred. An EKG in the ED was obtained which showed slight ST depression in the lateral leads.  A cardiac catheterization was done today which showed multivessel coronary artery disease and a left ventricular ejection fraction of 50-55%.  The patient is retired but works on cars for fun. He was in the backyard splitting wood when the chest pain began. He does seem pretty active although his shortness of breath has slowed him down over the past year.   Current Activity/ Functional Status: Patient was independent with mobility/ambulation, transfers, ADL's, IADL's.   Zubrod Score: At the time of surgery this patient's most appropriate activity status/level should be described as: []     0    Normal activity, no symptoms [x]     1    Restricted in physical strenuous activity but ambulatory, able to do out light work []     2    Ambulatory and capable of self care, unable to do work activities, up and about                 more than 50%  Of the time                            []     3    Only limited  self care, in bed greater than 50% of waking hours []     4    Completely disabled, no self care, confined to bed or chair []     5    Moribund  No past medical history on file.  No past surgical history on file.  Social History   Tobacco Use  Smoking Status Not on file    Social History   Substance and Sexual Activity  Alcohol Use Not on file    Social History   Socioeconomic History  . Marital status: Married    Spouse name: Not on file  . Number of children: Not on file  . Years of education: Not on file  . Highest education level: Not on file  Social Needs  . Financial resource strain: Not on file  . Food insecurity - worry: Not on file  . Food insecurity - inability: Not on file  . Transportation needs - medical: Not on file  . Transportation needs - non-medical: Not on file  Occupational History  . Not on file  Tobacco Use  . Smoking status: Not on file  Substance  and Sexual Activity  . Alcohol use: Not on file  . Drug use: Not on file  . Sexual activity: Not on file  Other Topics Concern  . Not on file  Social History Narrative  . Not on file    No Known Allergies  Current Facility-Administered Medications  Medication Dose Route Frequency Provider Last Rate Last Dose  . 0.9 %  sodium chloride infusion   Intravenous Continuous Johna Sheriff, MD 10 mL/hr at 03/29/17 0224    . 0.9 %  sodium chloride infusion  250 mL Intravenous PRN Martinique, Peter M, MD      . 0.9 %  sodium chloride infusion   Intravenous Continuous Lorretta Harp, MD      . 0.9% sodium chloride infusion  1 mL/kg/hr Intravenous Continuous [place], Peter M, MD 100.7 mL/hr at 03/29/17 1158 1 mL/kg/hr at 03/29/17 1158  . [MAR Hold] acetaminophen (TYLENOL) tablet 650 mg  650 mg Oral Q4H PRN Johna Sheriff, MD      . Doug Sou Hold] amLODipine (NORVASC) tablet 5 mg  5 mg Oral Daily Teena Dunk, Ameeth, MD   5 mg at 03/29/17 1046  . [MAR Hold] aspirin chewable tablet 81 mg  81 mg Oral Daily Cheryln Manly, NP      . Doug Sou Hold] atorvastatin (LIPITOR) tablet 80 mg  80 mg Oral q1800 Johna Sheriff, MD      . Doug Sou Hold] insulin aspart (novoLOG) injection 0-15 Units  0-15 Units Subcutaneous TID WC Cheryln Manly, NP      . Doug Sou Hold] losartan (COZAAR) tablet 50 mg  50 mg Oral Daily Johna Sheriff, MD      . Doug Sou Hold] metoprolol tartrate (LOPRESSOR) tablet 12.5 mg  12.5 mg Oral Once Johna Sheriff, MD      . Doug Sou Hold] nitroGLYCERIN (NITROSTAT) SL tablet 0.4 mg  0.4 mg Sublingual Q5 Min x 3 PRN Johna Sheriff, MD      . Doug Sou Hold] ondansetron [facility]) injection 4 mg  4 mg Intravenous Q6H PRN Vedre, Ameeth, MD      . sodium chloride flush (NS) 0.9 % injection 3 mL  3 mL Intravenous Q12H Martinique, Peter M, MD   3 mL at 03/29/17 1044  . sodium chloride flush (NS) 0.9 % injection 3 mL  3 mL Intravenous PRN Martinique, Peter M, MD        Medications Prior to Admission  Medication Sig Dispense Refill Last Dose  . amLODipine (NORVASC) 5 MG tablet Take 5 mg daily by mouth.   03/28/2017 at Unknown time  . aspirin EC 81 MG tablet Take 81 mg daily by mouth.   03/28/2017 at Unknown time  . glipiZIDE (GLUCOTROL) 5 MG tablet Take 5 mg daily by mouth.   03/28/2017 at Unknown time  . losartan (COZAAR) 50 MG tablet Take 50 mg daily by mouth.   03/28/2017 at Unknown time  . metFORMIN (GLUCOPHAGE) 1000 MG tablet Take 1,000 mg 2 (two) times daily by mouth.   03/28/2017 at Unknown time    No family history on file.   Review of Systems:  Pertinent items are noted in HPI.     Cardiac Review of Systems: Y or N  Chest Pain [ Y   ]  Resting SOB Aqua.Slicker ] Exertional SOB  [ Y ]  Orthopnea [  ]   Pedal Edema [ Y  ]    Palpitations [ N ] Syncope  [  ]   Presyncope [   ]  General Review of Systems: [Y] = yes [  ]=no Constitional: recent weight change [  ]; anorexia [  ]; fatigue [  ]; nausea [ N ]; night sweats [  ]; fever [ N ]; or chills [  ]                                                               Dental: poor dentition[   ]; Last Dentist visit:   Eye : blurred vision [  ]; diplopia [   ]; vision changes [  ];  Amaurosis fugax[  ]; Resp: cough Aqua.Slicker  ];  wheezing[ Y ];  hemoptysis[  ]; shortness of breath[Y  ]; paroxysmal nocturnal dyspnea[  ]; dyspnea on exertion[  Y]; or orthopnea[  ];  GI:  gallstones[  ], vomiting[N  ];  dysphagia[ N ]; melena[  ];  hematochezia [  ]; heartburn[  ];   Hx of  Colonoscopy[  ]; GU: kidney stones [  ]; hematuria[  ];   dysuria [  ];  nocturia[  ];  history of  obstruction [ N ]; urinary frequency [  ]             Skin: rash, swelling[  ];, hair loss[  ];  peripheral edema[ Y ];  or itching[  ]; Musculosketetal: myalgias[  ];  joint swelling[  ];  joint erythema[  ];  joint pain[  ];  back pain[  ];  Heme/Lymph: bruising[  ];  bleeding[  ];  anemia[  ];  Neuro: TIA[  ];  headaches[  ];  stroke[N  ];  vertigo[  ];  seizures[  ];   paresthesias[  ];  difficulty walking[  ];  Psych:depression[ N ]; anxiety[ N ];  Endocrine: diabetes[ Y ];  thyroid dysfunction[ N ];  Immunizations: Flu [  ]; Pneumococcal[  ];  Other:  Physical Exam: BP (!) 166/85   Pulse 73   Temp 98.8 F (37.1 C) (Oral)   Resp 18   Ht 5\' 11"  (1.803 m)   Wt 222 lb (100.7 kg)   SpO2 91%   BMI 30.96 kg/m    General appearance: alert, cooperative and no distress Resp: wheezes bilateral lobes Cardio: regular rate and rhythm, S1, S2 normal, no murmur, click, rub or gallop GI: soft, non-tender; bowel sounds normal; no masses,  no organomegaly Extremities: 1+ non pitting pedal edema    Cardiac Cath 03/29/2017:   Ost LM to Mid LM lesion is 90% stenosed.  Ost Cx lesion is 90% stenosed.  Ost LAD lesion is 70% stenosed.  Prox LAD lesion is 90% stenosed.  Prox Cx to Mid Cx lesion is 95% stenosed.  Prox RCA to Mid RCA lesion is 95% stenosed.  The left ventricular systolic function is normal.  LV end diastolic pressure is normal.  The left ventricular ejection fraction is 50-55% by visual estimate.       Recent Radiology Findings:   Dg Chest 2 View  Result Date: 03/28/2017 CLINICAL DATA:  Shortness of breath and chest pain. EXAM: CHEST  2 VIEW COMPARISON:  None. FINDINGS: Cardiomediastinal silhouette is normal. Mediastinal contours appear intact. Calcific atherosclerotic disease and tortuosity of the aorta. There is no evidence of focal airspace consolidation, pleural effusion or pneumothorax.  Osseous structures are without acute abnormality. Soft tissues are grossly normal. IMPRESSION: Calcific atherosclerotic disease and tortuosity of the aorta. No pulmonary consolidation or edema. Electronically Signed   By: Fidela Salisbury M.D.   On: 03/28/2017 21:11     I have independently reviewed the above radiologic studies.  Recent Lab Findings: Lab Results  Component Value Date   WBC 10.0 03/29/2017   HGB 13.8 03/29/2017   HCT 41.2 03/29/2017   PLT 203 03/29/2017   GLUCOSE 182 (H) 03/28/2017   ALT 17 03/28/2017   AST 26 03/28/2017   NA 136 03/28/2017   K 4.0 03/28/2017   CL 102 03/28/2017   CREATININE 0.90 03/28/2017   BUN 10 03/28/2017   CO2 25 03/28/2017   INR 1.10 03/29/2017      Assessment / Plan:    He will require an echocardiogram as a part of his surgical workup. May also need saphenous vein mapping. The procedure was explained to the patient and the family at the bedside in great detail. All questions were answered to their satisfaction.     Nicholes Rough, PA-C 03/29/2017 3:10 PM  I have seen and examined the patient and agree with the assessment and as outlined.  Patient is a 76 year old male with no previous cardiac history but risk factors notable for history of hypertension, type 2 diabetes mellitus, and long-standing tobacco abuse who describes greater than 1 year history of progressive symptoms of exertional shortness of breath and developed sudden onset yesterday of substernal chest pain and severe shortness of breath.  Symptoms persisted until he was given aspirin  in route to the emergency department.  By the time he arrived chest pain had resolved.  EKG revealed sinus rhythm with ST segment depression in lateral leads.  Troponins were elevated and peaked at 1.96 early this morning.  Patient has not had recurrent symptoms of substernal chest pain since admission.  I have personally reviewed the diagnostic cardiac catheterization performed by Dr. Gwenlyn Found.  The patient has severe left main and three-vessel coronary artery disease with preserved left ventricular systolic function.  I agree the patient would best be treated with surgical revascularization.  I have reviewed the indications, risks, and potential benefits of coronary artery bypass grafting with the patient and his family in the cath lab holding area.  Alternative treatment strategies have been discussed, including the relative risks, benefits and long term prognosis associated with medical therapy, percutaneous coronary intervention, and surgical revascularization.  The patient understands and accepts all potential associated risks of surgery including but not limited to risk of death, stroke or other neurologic complication, myocardial infarction, congestive heart failure, respiratory failure, renal failure, bleeding requiring blood transfusion and/or reexploration, aortic dissection or other major vascular complication, arrhythmia, heart block or bradycardia requiring permanent pacemaker, pneumonia, pleural effusion, wound infection, pulmonary embolus or other thromboembolic complication, chronic pain or other delayed complications related to median sternotomy, or the late recurrence of symptomatic ischemic heart disease and/or congestive heart failure.  The importance of long term risk modification have been emphasized.  All questions answered.  We plan coronary artery bypass grafting tomorrow morning.   I spent in excess of 90 minutes during the conduct of this hospital encounter and >50% of this time  involved direct face-to-face encounter with the patient for counseling and/or coordination of their care.   Rexene Alberts, MD 03/29/2017 4:59 PM

## 2017-03-29 NOTE — H&P (View-Only) (Signed)
The patient has been seen in conjunction with Bryce Powell, [facility]. All aspects of care have been considered and discussed. The patient has been personally interviewed, examined, and all clinical data has been reviewed.   Acute coronary syndrome in this elderly gentleman who likely has severe multivessel coronary disease.  Non-ST elevation presentation.  Multiple risk factors include diabetes, tobacco, age, family history, hyperlipidemia, and hypertension.  Coronary angiography to define anatomy and help guide therapy has been discussed.  Also discussed potential for PCI with stent implantation if appropriate. The patient was counseled to undergo left heart catheterization, coronary angiography, and possible percutaneous coronary intervention with stent implantation. The procedural risks and benefits were discussed in detail. The risks discussed included death, stroke, myocardial infarction, life-threatening bleeding, limb ischemia, kidney injury, allergy, and possible emergency cardiac surgery. The risk of these significant complications were estimated to occur less than 1% of the time. After discussion, the patient has agreed to proceed.  Progress Note  Patient Name: Bryce Powell Date of Encounter: 03/29/2017  Primary Cardiologist: New   Subjective   Feeling well, no chest pain.   Inpatient Medications    Scheduled Meds: . amLODipine  5 mg Oral Daily  . aspirin EC  325 mg Oral Daily  . aspirin  325 mg Oral Daily  . atorvastatin  80 mg Oral q1800  . losartan  50 mg Oral Daily  . metoprolol tartrate  12.5 mg Oral Once  . metoprolol tartrate  25 mg Oral Once   Continuous Infusions: . sodium chloride 10 mL/hr at 03/29/17 0224  . heparin 1,200 Units/hr (03/28/17 2306)   PRN Meds: acetaminophen, nitroGLYCERIN, nitroGLYCERIN, ondansetron (ZOFRAN) IV   Vital Signs    Vitals:   03/29/17 0730 03/29/17 0800 03/29/17 0830 03/29/17 0900  BP: 136/68 (!) 154/79 (!) 154/78 (!) 153/77   Pulse: 78 73 64 69  Resp:      Temp:      TempSrc:      SpO2: 94% 96% 92% 94%  Weight:      Height:        Intake/Output Summary (Last 24 hours) at 03/29/2017 7062 Last data filed at 03/29/2017 0810 Gross per 24 hour  Intake 1000 ml  Output -  Net 1000 ml   Filed Weights   03/28/17 2007  Weight: 222 lb (100.7 kg)    Telemetry    SR - Personally Reviewed  ECG    SR with inferior Q waves, and mild ST depression in lateral leads - Personally Reviewed  Physical Exam   General: Well developed, well nourished, male appearing in no acute distress. Head: Normocephalic, atraumatic.  Neck: Supple without bruits, JVD. Lungs:  Resp regular and unlabored, CTA. Heart: RRR, S1, S2, no S3, S4, or murmur; no rub. Abdomen: Soft, non-tender, non-distended with normoactive bowel sounds. No hepatomegaly. No rebound/guarding. No obvious abdominal masses. Extremities: No clubbing, cyanosis, edema. Distal pedal pulses are 2+ bilaterally. Neuro: Alert and oriented X 3. Moves all extremities spontaneously. Psych: Normal affect.  Labs    Chemistry Recent Labs  Lab 03/28/17 2007 03/28/17 2241 03/28/17 2310  NA 134*  --  136  K 3.8  --  4.0  CL 100*  --  102  CO2 25  --  25  GLUCOSE 235*  --  182*  BUN 9  --  10  CREATININE 0.87  --  0.90  CALCIUM 9.1  --  9.1  PROT  --  7.5  --   ALBUMIN  --  3.8  --   AST  --  26  --   ALT  --  17  --   ALKPHOS  --  81  --   BILITOT  --  0.5  --   GFRNONAA >60  --  >60  GFRAA >60  --  >60  ANIONGAP 9  --  9     Hematology Recent Labs  Lab 03/28/17 2007 03/28/17 2310 03/29/17 0459  WBC 10.8* 10.1 10.0  RBC 4.90 4.76 4.77  HGB 14.6 13.8 13.8  HCT 42.5 41.3 41.2  MCV 86.7 86.8 86.4  MCH 29.8 29.0 28.9  MCHC 34.4 33.4 33.5  RDW 15.3 15.0 15.1  PLT 219 221 203    Cardiac Enzymes Recent Labs  Lab 03/28/17 2241 03/28/17 2310 03/29/17 0459  TROPONINI 0.88* 1.04* 1.96*    Recent Labs  Lab 03/28/17 2027 03/28/17 2316    TROPIPOC 0.12* 0.98*     BNP Recent Labs  Lab 03/28/17 2007  BNP 75.4     DDimer No results for input(s): DDIMER in the last 168 hours.    Radiology    Dg Chest 2 View  Result Date: 03/28/2017 CLINICAL DATA:  Shortness of breath and chest pain. EXAM: CHEST  2 VIEW COMPARISON:  None. FINDINGS: Cardiomediastinal silhouette is normal. Mediastinal contours appear intact. Calcific atherosclerotic disease and tortuosity of the aorta. There is no evidence of focal airspace consolidation, pleural effusion or pneumothorax. Osseous structures are without acute abnormality. Soft tissues are grossly normal. IMPRESSION: Calcific atherosclerotic disease and tortuosity of the aorta. No pulmonary consolidation or edema. Electronically Signed   By: Fidela Salisbury M.D.   On: 03/28/2017 21:11    Cardiac Studies   N/a   Patient Profile     76 y.o. male with PMH of DM, HTN, HL, and tobacco use who presented with exertional chest pain, and NSTEMI  Assessment & Plan    1. NSTEMI: Was outside cutting wood yesterday afternoon, developed chest pain/tightness around 6pm that evening. Presented to the ED, EKG SR with inferior Q waves, and slight ST depression lateral leads. Trop 0.8>>1.04>>1.96. On IV heparin. CRF as he smokes 2ppd since he was 76 yo. Clinical picture is consistent with ACS.  -- plan for cath today -- The patient understands that risks included but are not limited to stroke (1 in 1000), death (1 in 23), kidney failure [usually temporary] (1 in 500), bleeding (1 in 200), allergic reaction [possibly serious] (1 in 200).  -- on ASA, BB, heparin, statin  2. DM: hold metformin, and glipizide with plans for cath.  -- SSI  -- check Hgb A1c   3. HTN: Bps have been variable. On BB, and ARB. Metoprolol held 2/2 to family request.   4. Tobacco use: Cessation strongly advised.   5. HL: has been tried on statin therapy in the past, but he stopped 2/2 myalgias, Lipitor restarted this  admission -- check lipids  Signed, Bryce Bellis, NP  03/29/2017, 9:38 AM  Pager # [phone]   For questions or updates, please contact [place] Please consult [email or web address] for contact info under Cardiology/STEMI.

## 2017-03-29 NOTE — ED Notes (Signed)
Attending NP at bedside.

## 2017-03-29 NOTE — Progress Notes (Signed)
Family in and out visiting. Eating [place] sandwich. Waiting on 2H bed

## 2017-03-29 NOTE — Progress Notes (Signed)
ANTICOAGULATION CONSULT NOTE - Follow Up Consult  Pharmacy Consult for Heparin Indication: 3 vessel CAD  No Known Allergies  Patient Measurements: Height: 5\' 11"  (180.3 cm) Weight: 222 lb (100.7 kg) IBW/kg (Calculated) : 75.3 Heparin Dosing Weight: 96kg  Vital Signs: BP: 176/88 (11/15 1500) Pulse Rate: 73 (11/15 1500)  Labs: Recent Labs    03/28/17 2007  03/28/17 2241 03/28/17 2310 03/29/17 0459 03/29/17 0756 03/29/17 0800 03/29/17 1024  HGB 14.6  --   --  13.8 13.8  --   --   --   HCT 42.5  --   --  41.3 41.2  --   --   --   PLT 219  --   --  221 203  --   --   --   LABPROT  --   --   --   --   --   --  14.1  --   INR  --   --   --   --   --   --  1.10  --   HEPARINUNFRC  --   --   --   --   --  0.71*  --   --   CREATININE 0.87  --   --  0.90  --   --   --   --   CKTOTAL  --   --  191  --   --   --   --   --   CKMB  --   --  18.4*  --   --   --   --   --   TROPONINI  --    < > 0.88* 1.04* 1.96*  --   --  1.42*   < > = values in this interval not displayed.    Estimated Creatinine Clearance: 85.8 mL/min (by C-G formula based on SCr of 0.9 mg/dL).  Assessment: 75yom s/p cath found to have severe 3 vessel disease. Heparin to resume post-cath pending CVTS consult for CABG. Radial sheath removed at 1425 and TR band applied.  Goal of Therapy:  Heparin level 0.3-0.7 units/ml Monitor platelets by anticoagulation protocol: Yes   Plan:  1) At 1700, resume heparin at 1100 units/hr 2) Check 8 hour heparin level 3) Daily heparin level and CBC  Deboraha Sprang 03/29/2017,3:18 PM

## 2017-03-29 NOTE — ED Notes (Signed)
Pt placed on hospital bed. Family wife remains at bedside.

## 2017-03-29 NOTE — Care Management Note (Signed)
Case Management Note  Patient Details  Name: Josph Norfleet MRN: [id number] Date of Birth: 1941/02/22  Subjective/Objective:                  From home with spouse exerting in the backyard and splitting Feliciano Wynter, when he  started  having severe chest  pain, aching and tightness sternal- radiating to neck.    Action/Plan: Admit status INPATIENT (NSTEMI); anticipate discharge [place].   Expected Discharge Date:  (unknown)               Expected Discharge Plan:  Home/Self Care  In-House Referral:     Discharge planning Services  CM Consult  Post Acute Care Choice:    Choice offered to:     DME Arranged:    DME Agency:     HH Arranged:    HH Agency:     Status of Service:  In process, will continue to follow  If discussed at Long Length of Stay Meetings, dates discussed:    Additional Comments:  Fuller Mandril, RN 03/29/2017, 11:03 AM

## 2017-03-29 NOTE — Progress Notes (Signed)
Dr. Roxy Manns in patient's room with family present.

## 2017-03-29 NOTE — Interval H&P Note (Signed)
Cath Lab Visit (complete for each Cath Lab visit)  Clinical Evaluation Leading to the Procedure:   ACS: Yes.    Non-ACS:    Anginal Classification: CCS III  Anti-ischemic medical therapy: No Therapy  Non-Invasive Test Results: No non-invasive testing performed  Prior CABG: No previous CABG      Cath Lab Visit (complete for each Cath Lab visit)  Clinical Evaluation Leading to the Procedure:   ACS: Yes.    Non-ACS:    Anginal Classification: CCS III  Anti-ischemic medical therapy: No Therapy  Non-Invasive Test Results: No non-invasive testing performed  Prior CABG: No previous CABG      History and Physical Interval Note:  03/29/2017 2:04 PM  Bryce Powell  has presented today for surgery, with the diagnosis of cp  The various methods of treatment have been discussed with the patient and family. After consideration of risks, benefits and other options for treatment, the patient has consented to  Procedure(s): LEFT HEART CATH AND CORONARY ANGIOGRAPHY (N/A) as a surgical intervention .  The patient's history has been reviewed, patient examined, no change in status, stable for surgery.  I have reviewed the patient's chart and labs.  Questions were answered to the patient's satisfaction.     Quay Burow

## 2017-03-29 NOTE — Progress Notes (Signed)
ANTICOAGULATION CONSULT NOTE - Follow-Up Consult  Pharmacy Consult for heparin Indication: chest pain/ACS  No Known Allergies  Patient Measurements: Height: 5\' 11"  (180.3 cm) Weight: 222 lb (100.7 kg) IBW/kg (Calculated) : 75.3 Heparin Dosing Weight: 96.1kg  Vital Signs: BP: 153/77 (11/15 0900) Pulse Rate: 69 (11/15 0900)  Labs: Recent Labs    03/28/17 2007 03/28/17 2241 03/28/17 2310 03/29/17 0459 03/29/17 0756 03/29/17 0800  HGB 14.6  --  13.8 13.8  --   --   HCT 42.5  --  41.3 41.2  --   --   PLT 219  --  221 203  --   --   LABPROT  --   --   --   --   --  14.1  INR  --   --   --   --   --  1.10  HEPARINUNFRC  --   --   --   --  0.71*  --   CREATININE 0.87  --  0.90  --   --   --   CKTOTAL  --  191  --   --   --   --   CKMB  --  18.4*  --   --   --   --   TROPONINI  --  0.88* 1.04* 1.96*  --   --     Estimated Creatinine Clearance: 85.8 mL/min (by C-G formula based on SCr of 0.9 mg/dL).   Medical History: No past medical history on file.  Medications:  Infusions:  . sodium chloride 10 mL/hr at 03/29/17 0224  . heparin 1,200 Units/hr (03/28/17 2306)    Assessment: 60 yom presented to the ED with CP and SOB. Troponin elevated and now starting IV heparin. Baseline H/H and platelets are WNL. He is not on anticoagulation PTA.   HL 0.71 after 4000 unit bolus + 1200 units/hr.  Goal of Therapy:  Heparin level 0.3-0.7 units/ml Monitor platelets by anticoagulation protocol: Yes   Plan:  Decrease Heparin gtt to 1100 units/hr Plan to go to cath lab 11/15 early afternoon Daily heparin level and CBC  Carrie Usery A Delara Shepheard 03/29/2017,9:45 AM

## 2017-03-29 NOTE — Progress Notes (Signed)
TR BAND REMOVAL  LOCATION:    Radial rt radial  DEFLATED PER PROTOCOL:   yes  TIME BAND OFF / DRESSING APPLIED:    1740/gauze and tegaderm  SITE UPON ARRIVAL:    Level  0  SITE AFTER BAND REMOVAL:    Level  0  CIRCULATION SENSATION AND MOVEMENT:    Within Normal Limits : yes COMMENTS:

## 2017-03-29 NOTE — Progress Notes (Signed)
The patient has been seen in conjunction with [name], [facility]. All aspects of care have been considered and discussed. The patient has been personally interviewed, examined, and all clinical data has been reviewed.   Acute coronary syndrome in this elderly gentleman who likely has severe multivessel coronary disease.  Non-ST elevation presentation.  Multiple risk factors include diabetes, tobacco, age, family history, hyperlipidemia, and hypertension.  Coronary angiography to define anatomy and help guide therapy has been discussed.  Also discussed potential for PCI with stent implantation if appropriate. The patient was counseled to undergo left heart catheterization, coronary angiography, and possible percutaneous coronary intervention with stent implantation. The procedural risks and benefits were discussed in detail. The risks discussed included death, stroke, myocardial infarction, life-threatening bleeding, limb ischemia, kidney injury, allergy, and possible emergency cardiac surgery. The risk of these significant complications were estimated to occur less than 1% of the time. After discussion, the patient has agreed to proceed.  Progress Note  Patient Name: Bryce Powell Date of Encounter: 03/29/2017  Primary Cardiologist: New   Subjective   Feeling well, no chest pain.   Inpatient Medications    Scheduled Meds: . amLODipine  5 mg Oral Daily  . aspirin EC  325 mg Oral Daily  . aspirin  325 mg Oral Daily  . atorvastatin  80 mg Oral q1800  . losartan  50 mg Oral Daily  . metoprolol tartrate  12.5 mg Oral Once  . metoprolol tartrate  25 mg Oral Once   Continuous Infusions: . sodium chloride 10 mL/hr at 03/29/17 0224  . heparin 1,200 Units/hr (03/28/17 2306)   PRN Meds: acetaminophen, nitroGLYCERIN, nitroGLYCERIN, ondansetron (ZOFRAN) IV   Vital Signs    Vitals:   03/29/17 0730 03/29/17 0800 03/29/17 0830 03/29/17 0900  BP: 136/68 (!) 154/79 (!) 154/78 (!) 153/77   Pulse: 78 73 64 69  Resp:      Temp:      TempSrc:      SpO2: 94% 96% 92% 94%  Weight:      Height:        Intake/Output Summary (Last 24 hours) at 03/29/2017 1962 Last data filed at 03/29/2017 0810 Gross per 24 hour  Intake 1000 ml  Output -  Net 1000 ml   Filed Weights   03/28/17 2007  Weight: 222 lb (100.7 kg)    Telemetry    SR - Personally Reviewed  ECG    SR with inferior Q waves, and mild ST depression in lateral leads - Personally Reviewed  Physical Exam   General: Well developed, well nourished, male appearing in no acute distress. Head: Normocephalic, atraumatic.  Neck: Supple without bruits, JVD. Lungs:  Resp regular and unlabored, CTA. Heart: RRR, S1, S2, no S3, S4, or murmur; no rub. Abdomen: Soft, non-tender, non-distended with normoactive bowel sounds. No hepatomegaly. No rebound/guarding. No obvious abdominal masses. Extremities: No clubbing, cyanosis, edema. Distal pedal pulses are 2+ bilaterally. Neuro: Alert and oriented X 3. Moves all extremities spontaneously. Psych: Normal affect.  Labs    Chemistry Recent Labs  Lab 03/28/17 2007 03/28/17 2241 03/28/17 2310  NA 134*  --  136  K 3.8  --  4.0  CL 100*  --  102  CO2 25  --  25  GLUCOSE 235*  --  182*  BUN 9  --  10  CREATININE 0.87  --  0.90  CALCIUM 9.1  --  9.1  PROT  --  7.5  --   ALBUMIN  --  3.8  --   AST  --  26  --   ALT  --  17  --   ALKPHOS  --  81  --   BILITOT  --  0.5  --   GFRNONAA >60  --  >60  GFRAA >60  --  >60  ANIONGAP 9  --  9     Hematology Recent Labs  Lab 03/28/17 2007 03/28/17 2310 03/29/17 0459  WBC 10.8* 10.1 10.0  RBC 4.90 4.76 4.77  HGB 14.6 13.8 13.8  HCT 42.5 41.3 41.2  MCV 86.7 86.8 86.4  MCH 29.8 29.0 28.9  MCHC 34.4 33.4 33.5  RDW 15.3 15.0 15.1  PLT 219 221 203    Cardiac Enzymes Recent Labs  Lab 03/28/17 2241 03/28/17 2310 03/29/17 0459  TROPONINI 0.88* 1.04* 1.96*    Recent Labs  Lab 03/28/17 2027 03/28/17 2316    TROPIPOC 0.12* 0.98*     BNP Recent Labs  Lab 03/28/17 2007  BNP 75.4     DDimer No results for input(s): DDIMER in the last 168 hours.    Radiology    Dg Chest 2 View  Result Date: 03/28/2017 CLINICAL DATA:  Shortness of breath and chest pain. EXAM: CHEST  2 VIEW COMPARISON:  None. FINDINGS: Cardiomediastinal silhouette is normal. Mediastinal contours appear intact. Calcific atherosclerotic disease and tortuosity of the aorta. There is no evidence of focal airspace consolidation, pleural effusion or pneumothorax. Osseous structures are without acute abnormality. Soft tissues are grossly normal. IMPRESSION: Calcific atherosclerotic disease and tortuosity of the aorta. No pulmonary consolidation or edema. Electronically Signed   By: Fidela Salisbury M.D.   On: 03/28/2017 21:11    Cardiac Studies   N/a   Patient Profile     76 y.o. male with PMH of DM, HTN, HL, and tobacco use who presented with exertional chest pain, and NSTEMI  Assessment & Plan    1. NSTEMI: Was outside cutting wood yesterday afternoon, developed chest pain/tightness around 6pm that evening. Presented to the ED, EKG SR with inferior Q waves, and slight ST depression lateral leads. Trop 0.8>>1.04>>1.96. On IV heparin. CRF as he smokes 2ppd since he was 76 yo. Clinical picture is consistent with ACS.  -- plan for cath today -- The patient understands that risks included but are not limited to stroke (1 in 1000), death (1 in 24), kidney failure [usually temporary] (1 in 500), bleeding (1 in 200), allergic reaction [possibly serious] (1 in 200).  -- on ASA, BB, heparin, statin  2. DM: hold metformin, and glipizide with plans for cath.  -- SSI  -- check Hgb A1c   3. HTN: Bps have been variable. On BB, and ARB. Metoprolol held 2/2 to family request.   4. Tobacco use: Cessation strongly advised.   5. HL: has been tried on statin therapy in the past, but he stopped 2/2 myalgias, Lipitor restarted this  admission -- check lipids  Signed, [name], NP  03/29/2017, 9:38 AM  Pager # [phone]   For questions or updates, please contact [place] Please consult [email or web address] for contact info under Cardiology/STEMI.

## 2017-03-30 ENCOUNTER — Inpatient Hospital Stay (HOSPITAL_COMMUNITY): Payer: Medicare Other

## 2017-03-30 ENCOUNTER — Other Ambulatory Visit (HOSPITAL_COMMUNITY): Payer: Medicare Other

## 2017-03-30 ENCOUNTER — Encounter (HOSPITAL_COMMUNITY)
Admission: EM | Disposition: A | Discharge status: Home / Self Care | Payer: Self-pay | Source: Home / Self Care | Attending: Thoracic Surgery (Cardiothoracic Vascular Surgery)

## 2017-03-30 ENCOUNTER — Inpatient Hospital Stay (HOSPITAL_COMMUNITY): Payer: Medicare Other | Admitting: Certified Registered Nurse Anesthetist

## 2017-03-30 ENCOUNTER — Encounter (HOSPITAL_COMMUNITY): Payer: Self-pay | Admitting: Cardiovascular Disease

## 2017-03-30 DIAGNOSIS — Z720 Tobacco use: Secondary | ICD-10-CM | POA: Diagnosis present

## 2017-03-30 DIAGNOSIS — Z951 Presence of aortocoronary bypass graft: Secondary | ICD-10-CM

## 2017-03-30 HISTORY — PX: CORONARY ARTERY BYPASS GRAFT: SHX141

## 2017-03-30 HISTORY — DX: Presence of aortocoronary bypass graft: Z95.1

## 2017-03-30 HISTORY — PX: TEE WITHOUT CARDIOVERSION: SHX5443

## 2017-03-30 LAB — GLUCOSE, CAPILLARY
Glucose-Capillary: 191 mg/dL — ABNORMAL HIGH (ref 65–99)
Glucose-Capillary: 116 mg/dL — ABNORMAL HIGH (ref 65–99)
Glucose-Capillary: 140 mg/dL — ABNORMAL HIGH (ref 65–99)
Glucose-Capillary: 64 mg/dL — ABNORMAL LOW (ref 65–99)
Glucose-Capillary: 167 mg/dL — ABNORMAL HIGH (ref 65–99)
Glucose-Capillary: 162 mg/dL — ABNORMAL HIGH (ref 65–99)

## 2017-03-30 LAB — POCT I-STAT 3, ART BLOOD GAS (G3+)
Acid-base deficit: 4 mmol/L — ABNORMAL HIGH (ref 0.0–2.0)
Acid-base deficit: 3 mmol/L — ABNORMAL HIGH (ref 0.0–2.0)
Acid-base deficit: 3 mmol/L — ABNORMAL HIGH (ref 0.0–2.0)
Bicarbonate: 21.7 mmol/L (ref 20.0–28.0)
Bicarbonate: 22.3 mmol/L (ref 20.0–28.0)
Bicarbonate: 22 mmol/L (ref 20.0–28.0)
O2 Saturation: 99 %
O2 Saturation: 97 %
O2 Saturation: 96 %
Patient temperature: 36.8
Patient temperature: 37
TCO2: 23 mmol/L (ref 22–32)
TCO2: 24 mmol/L (ref 22–32)
TCO2: 23 mmol/L (ref 22–32)
pCO2 arterial: 39.3 mmHg (ref 32.0–48.0)
pCO2 arterial: 41.9 mmHg (ref 32.0–48.0)
pCO2 arterial: 39.1 mmHg (ref 32.0–48.0)
pH, Arterial: 7.349 — ABNORMAL LOW (ref 7.350–7.450)
pH, Arterial: 7.334 — ABNORMAL LOW (ref 7.350–7.450)
pH, Arterial: 7.359 (ref 7.350–7.450)
pO2, Arterial: 153 mmHg — ABNORMAL HIGH (ref 83.0–108.0)
pO2, Arterial: 93 mmHg (ref 83.0–108.0)
pO2, Arterial: 83 mmHg (ref 83.0–108.0)

## 2017-03-30 LAB — HEMOGLOBIN AND HEMATOCRIT, BLOOD
HCT: 31.5 % — ABNORMAL LOW (ref 39.0–52.0)
Hemoglobin: 10.5 g/dL — ABNORMAL LOW (ref 13.0–17.0)

## 2017-03-30 LAB — BASIC METABOLIC PANEL
Anion gap: 7 (ref 5–15)
BUN: 8 mg/dL (ref 6–20)
CO2: 26 mmol/L (ref 22–32)
Calcium: 8.6 mg/dL — ABNORMAL LOW (ref 8.9–10.3)
Chloride: 102 mmol/L (ref 101–111)
Creatinine, Ser: 0.87 mg/dL (ref 0.61–1.24)
GFR calc Af Amer: >60 mL/min (ref >60)
GFR calc non Af Amer: >60 mL/min (ref >60)
Glucose, Bld: 214 mg/dL — ABNORMAL HIGH (ref 65–99)
Potassium: 3.6 mmol/L (ref 3.5–5.1)
Sodium: 135 mmol/L (ref 135–145)

## 2017-03-30 LAB — ECHO TEE
AV Area mean vel: 3 cm2
AV pk vel: 112 cm/s
LVOT diameter: 2.47 mm
VTI: 24.1 cm

## 2017-03-30 LAB — CBC
HCT: 38.9 % — ABNORMAL LOW (ref 39.0–52.0)
HCT: 32.5 % — ABNORMAL LOW (ref 39.0–52.0)
Hemoglobin: 13 g/dL (ref 13.0–17.0)
Hemoglobin: 10.7 g/dL — ABNORMAL LOW (ref 13.0–17.0)
MCH: 28.8 pg (ref 26.0–34.0)
MCH: 28.7 pg (ref 26.0–34.0)
MCHC: 33.4 g/dL (ref 30.0–36.0)
MCHC: 32.9 g/dL (ref 30.0–36.0)
MCV: 86.3 fL (ref 78.0–100.0)
MCV: 87.1 fL (ref 78.0–100.0)
Platelets: 211 10*3/uL (ref 150–400)
Platelets: 127 10*3/uL — ABNORMAL LOW (ref 150–400)
RBC: 4.51 MIL/uL (ref 4.22–5.81)
RBC: 3.73 MIL/uL — ABNORMAL LOW (ref 4.22–5.81)
RDW: 15.1 % (ref 11.5–15.5)
RDW: 15.2 % (ref 11.5–15.5)
WBC: 8.4 10*3/uL (ref 4.0–10.5)
WBC: 8.9 10*3/uL (ref 4.0–10.5)

## 2017-03-30 LAB — LIPID PANEL
Cholesterol: 177 mg/dL (ref 0–200)
HDL: 25 mg/dL — ABNORMAL LOW (ref >40)
LDL Cholesterol: UNDETERMINED mg/dL (ref 0–99)
Total CHOL/HDL Ratio: 7.1 RATIO
Triglycerides: 793 mg/dL — ABNORMAL HIGH (ref <150)
VLDL: UNDETERMINED mg/dL (ref 0–40)

## 2017-03-30 LAB — POCT I-STAT 4, (NA,K, GLUC, HGB,HCT)
Glucose, Bld: 125 mg/dL — ABNORMAL HIGH (ref 65–99)
HCT: 31 % — ABNORMAL LOW (ref 39.0–52.0)
Hemoglobin: 10.5 g/dL — ABNORMAL LOW (ref 13.0–17.0)
Potassium: 3.6 mmol/L (ref 3.5–5.1)
Sodium: 143 mmol/L (ref 135–145)

## 2017-03-30 LAB — PROTIME-INR
INR: 1.04
INR: 1.29
Prothrombin Time: 13.5 seconds (ref 11.4–15.2)
Prothrombin Time: 16 seconds — ABNORMAL HIGH (ref 11.4–15.2)

## 2017-03-30 LAB — APTT
aPTT: 45 seconds — ABNORMAL HIGH (ref 24–36)
aPTT: 35 seconds (ref 24–36)

## 2017-03-30 LAB — HEMOGLOBIN A1C
Hgb A1c MFr Bld: 8.6 % — ABNORMAL HIGH (ref 4.8–5.6)
Mean Plasma Glucose: 200.12 mg/dL

## 2017-03-30 LAB — PLATELET COUNT: Platelets: 164 10*3/uL (ref 150–400)

## 2017-03-30 SURGERY — CORONARY ARTERY BYPASS GRAFTING (CABG)
Anesthesia: General | Site: Chest

## 2017-03-30 MED ORDER — MAGNESIUM SULFATE 4 GM/100ML IV SOLN
4.0000 g | Freq: Once | INTRAVENOUS | Status: AC
  Administered 2017-03-30: 4 g via INTRAVENOUS
  Filled 2017-03-30: qty 100

## 2017-03-30 MED ORDER — MIDAZOLAM HCL 2 MG/2ML IJ SOLN: 2.0000 mg | INTRAMUSCULAR | Status: DC | PRN

## 2017-03-30 MED ORDER — ACETAMINOPHEN 160 MG/5ML PO SOLN: 650.0000 mg | Freq: Once | ORAL | Status: AC

## 2017-03-30 MED ORDER — OXYCODONE HCL 5 MG PO TABS
5.0000 mg | ORAL_TABLET | ORAL | Status: DC | PRN
  Administered 2017-03-31: 5 mg via ORAL
  Filled 2017-03-30: qty 1

## 2017-03-30 MED ORDER — ASPIRIN EC 325 MG PO TBEC
325.0000 mg | DELAYED_RELEASE_TABLET | Freq: Every day | ORAL | Status: DC
  Administered 2017-03-31 – 2017-04-01 (×2): 325 mg via ORAL
  Filled 2017-03-30 (×2): qty 1

## 2017-03-30 MED ORDER — ACETAMINOPHEN 650 MG RE SUPP
650.0000 mg | Freq: Once | RECTAL | Status: AC
  Administered 2017-03-30: 650 mg via RECTAL

## 2017-03-30 MED ORDER — BISACODYL 5 MG PO TBEC
10.0000 mg | DELAYED_RELEASE_TABLET | Freq: Every day | ORAL | Status: DC
  Administered 2017-03-31 – 2017-04-03 (×4): 10 mg via ORAL
  Filled 2017-03-30 (×4): qty 2

## 2017-03-30 MED ORDER — LACTATED RINGERS IV SOLN
INTRAVENOUS | Status: DC | PRN
  Administered 2017-03-30: 09:00:00 via INTRAVENOUS

## 2017-03-30 MED ORDER — CHLORHEXIDINE GLUCONATE CLOTH 2 % EX PADS
6.0000 | MEDICATED_PAD | Freq: Every day | CUTANEOUS | Status: DC
  Administered 2017-03-30 – 2017-04-04 (×6): 6 via TOPICAL

## 2017-03-30 MED ORDER — CHLORHEXIDINE GLUCONATE CLOTH 2 % EX PADS: 6.0000 | MEDICATED_PAD | Freq: Every day | CUTANEOUS | Status: DC

## 2017-03-30 MED ORDER — CHLORHEXIDINE GLUCONATE 0.12 % MT SOLN
15.0000 mL | OROMUCOSAL | Status: AC
  Administered 2017-03-30: 15 mL via OROMUCOSAL

## 2017-03-30 MED ORDER — FAMOTIDINE IN NACL 20-0.9 MG/50ML-% IV SOLN
20.0000 mg | Freq: Two times a day (BID) | INTRAVENOUS | Status: AC
  Administered 2017-03-30: 20 mg via INTRAVENOUS
  Filled 2017-03-30: qty 50

## 2017-03-30 MED ORDER — KENNESTONE BLOOD CARDIOPLEGIA VIAL
13.0000 mL | Freq: Once | Status: DC
  Filled 2017-03-30: qty 13

## 2017-03-30 MED ORDER — ROCURONIUM BROMIDE 10 MG/ML (PF) SYRINGE
PREFILLED_SYRINGE | INTRAVENOUS | Status: DC | PRN
  Administered 2017-03-30 (×4): 50 mg via INTRAVENOUS

## 2017-03-30 MED ORDER — PROTAMINE SULFATE 10 MG/ML IV SOLN
INTRAVENOUS | Status: DC | PRN
  Administered 2017-03-30: 50 mg via INTRAVENOUS
  Administered 2017-03-30 (×2): 20 mg via INTRAVENOUS
  Administered 2017-03-30: 50 mg via INTRAVENOUS
  Administered 2017-03-30: 20 mg via INTRAVENOUS
  Administered 2017-03-30 (×3): 50 mg via INTRAVENOUS

## 2017-03-30 MED ORDER — CHLORHEXIDINE GLUCONATE 0.12% ORAL RINSE (MEDLINE KIT)
15.0000 mL | Freq: Two times a day (BID) | OROMUCOSAL | Status: DC
  Administered 2017-03-30 – 2017-04-01 (×4): 15 mL via OROMUCOSAL

## 2017-03-30 MED ORDER — SODIUM CHLORIDE 0.9% FLUSH: 3.0000 mL | INTRAVENOUS | Status: DC | PRN

## 2017-03-30 MED ORDER — FENTANYL CITRATE (PF) 250 MCG/5ML IJ SOLN
INTRAMUSCULAR | Status: AC
  Filled 2017-03-30: qty 25

## 2017-03-30 MED ORDER — KENNESTONE BLOOD CARDIOPLEGIA (KBC) MANNITOL SYRINGE (20%, 32ML)
32.0000 mL | Freq: Once | INTRAVENOUS | Status: DC
  Filled 2017-03-30: qty 32

## 2017-03-30 MED ORDER — SODIUM CHLORIDE 0.9 % IV SOLN
INTRAVENOUS | Status: AC
  Administered 2017-03-30: 18:00:00 via INTRAVENOUS

## 2017-03-30 MED ORDER — PROPOFOL 10 MG/ML IV BOLUS
INTRAVENOUS | Status: AC
  Filled 2017-03-30: qty 20

## 2017-03-30 MED ORDER — MILRINONE LACTATE IN DEXTROSE 20-5 MG/100ML-% IV SOLN
0.1250 ug/kg/min | INTRAVENOUS | Status: DC
  Filled 2017-03-30: qty 100

## 2017-03-30 MED ORDER — VANCOMYCIN HCL IN DEXTROSE 1-5 GM/200ML-% IV SOLN
1000.0000 mg | Freq: Once | INTRAVENOUS | Status: AC
  Administered 2017-03-31: 1000 mg via INTRAVENOUS
  Filled 2017-03-30: qty 200

## 2017-03-30 MED ORDER — SODIUM CHLORIDE 0.9% FLUSH: 10.0000 mL | INTRAVENOUS | Status: DC | PRN

## 2017-03-30 MED ORDER — POTASSIUM CHLORIDE 10 MEQ/50ML IV SOLN
10.0000 meq | INTRAVENOUS | Status: AC
  Administered 2017-03-30 (×3): 10 meq via INTRAVENOUS

## 2017-03-30 MED ORDER — MUPIROCIN 2 % EX OINT: 1.0000 "application " | TOPICAL_OINTMENT | Freq: Two times a day (BID) | CUTANEOUS | Status: DC

## 2017-03-30 MED ORDER — ORAL CARE MOUTH RINSE
15.0000 mL | Freq: Four times a day (QID) | OROMUCOSAL | Status: DC
  Administered 2017-03-31 – 2017-04-01 (×7): 15 mL via OROMUCOSAL

## 2017-03-30 MED ORDER — HEPARIN SODIUM (PORCINE) 1000 UNIT/ML IJ SOLN
INTRAMUSCULAR | Status: AC
  Filled 2017-03-30: qty 1

## 2017-03-30 MED ORDER — SODIUM CHLORIDE 0.9 % IV SOLN
INTRAVENOUS | Status: DC
  Administered 2017-03-30: 1.1 [IU]/h via INTRAVENOUS
  Filled 2017-03-30: qty 1

## 2017-03-30 MED ORDER — MORPHINE SULFATE (PF) 4 MG/ML IV SOLN
1.0000 mg | INTRAVENOUS | Status: DC | PRN
  Administered 2017-03-30 – 2017-03-31 (×2): 2 mg via INTRAVENOUS
  Filled 2017-03-30 (×2): qty 1

## 2017-03-30 MED ORDER — HEPARIN SODIUM (PORCINE) 1000 UNIT/ML IJ SOLN
INTRAMUSCULAR | Status: DC | PRN
  Administered 2017-03-30: 35000 [IU] via INTRAVENOUS

## 2017-03-30 MED ORDER — SODIUM CHLORIDE 0.9% FLUSH
10.0000 mL | Freq: Two times a day (BID) | INTRAVENOUS | Status: DC
  Administered 2017-03-30 – 2017-04-01 (×5): 10 mL

## 2017-03-30 MED ORDER — LIDOCAINE 2% (20 MG/ML) 5 ML SYRINGE
INTRAMUSCULAR | Status: DC | PRN
  Administered 2017-03-30: 30 mg via INTRAVENOUS

## 2017-03-30 MED ORDER — BISACODYL 10 MG RE SUPP: 10.0000 mg | Freq: Every day | RECTAL | Status: DC

## 2017-03-30 MED ORDER — ACETAMINOPHEN 500 MG PO TABS
1000.0000 mg | ORAL_TABLET | Freq: Four times a day (QID) | ORAL | Status: DC
  Administered 2017-03-31 – 2017-04-04 (×15): 1000 mg via ORAL
  Filled 2017-03-30 (×14): qty 2

## 2017-03-30 MED ORDER — SODIUM CHLORIDE 0.9 % IV SOLN: 250.0000 mL | INTRAVENOUS | Status: DC

## 2017-03-30 MED ORDER — SODIUM CHLORIDE 0.9 % IV SOLN
INTRAVENOUS | Status: DC
  Administered 2017-03-30 – 2017-03-31 (×2): via INTRAVENOUS

## 2017-03-30 MED ORDER — PANTOPRAZOLE SODIUM 40 MG PO TBEC
40.0000 mg | DELAYED_RELEASE_TABLET | Freq: Every day | ORAL | Status: DC
  Administered 2017-04-01 – 2017-04-04 (×4): 40 mg via ORAL
  Filled 2017-03-30 (×4): qty 1

## 2017-03-30 MED ORDER — POTASSIUM CHLORIDE 10 MEQ/50ML IV SOLN
10.0000 meq | Freq: Once | INTRAVENOUS | Status: AC
  Administered 2017-03-30: 10 meq via INTRAVENOUS
  Filled 2017-03-30: qty 50

## 2017-03-30 MED ORDER — MIDAZOLAM HCL 5 MG/5ML IJ SOLN
INTRAMUSCULAR | Status: DC | PRN
  Administered 2017-03-30 (×3): 2 mg via INTRAVENOUS
  Administered 2017-03-30: 3 mg via INTRAVENOUS
  Administered 2017-03-30: 1 mg via INTRAVENOUS

## 2017-03-30 MED ORDER — FENTANYL CITRATE (PF) 250 MCG/5ML IJ SOLN
INTRAMUSCULAR | Status: AC
  Filled 2017-03-30: qty 5

## 2017-03-30 MED ORDER — PROPOFOL 10 MG/ML IV BOLUS
INTRAVENOUS | Status: DC | PRN
  Administered 2017-03-30: 70 mg via INTRAVENOUS

## 2017-03-30 MED ORDER — LACTATED RINGERS IV SOLN: INTRAVENOUS | Status: DC

## 2017-03-30 MED ORDER — ROCURONIUM BROMIDE 10 MG/ML (PF) SYRINGE
PREFILLED_SYRINGE | INTRAVENOUS | Status: AC
  Filled 2017-03-30: qty 10

## 2017-03-30 MED ORDER — MUPIROCIN 2 % EX OINT
1.0000 "application " | TOPICAL_OINTMENT | Freq: Two times a day (BID) | CUTANEOUS | Status: AC
  Administered 2017-03-30 – 2017-04-04 (×10): 1 via NASAL
  Filled 2017-03-30 (×3): qty 22

## 2017-03-30 MED ORDER — VANCOMYCIN HCL 1000 MG IV SOLR
INTRAVENOUS | Status: AC
  Administered 2017-03-30: 1000 mL
  Filled 2017-03-30: qty 1000

## 2017-03-30 MED ORDER — LACTATED RINGERS IV SOLN
500.0000 mL | Freq: Once | INTRAVENOUS | Status: AC | PRN
  Administered 2017-03-30: 500 mL via INTRAVENOUS

## 2017-03-30 MED ORDER — SODIUM CHLORIDE 0.9 % IJ SOLN
OROMUCOSAL | Status: DC | PRN
  Administered 2017-03-30 (×3): 4 mL via TOPICAL

## 2017-03-30 MED ORDER — METOPROLOL TARTRATE 12.5 MG HALF TABLET
12.5000 mg | ORAL_TABLET | Freq: Two times a day (BID) | ORAL | Status: DC
  Administered 2017-03-30 – 2017-03-31 (×3): 12.5 mg via ORAL
  Filled 2017-03-30 (×4): qty 1

## 2017-03-30 MED ORDER — SODIUM CHLORIDE 0.9% FLUSH
3.0000 mL | Freq: Two times a day (BID) | INTRAVENOUS | Status: DC
  Administered 2017-03-31 – 2017-04-03 (×7): 3 mL via INTRAVENOUS

## 2017-03-30 MED ORDER — ACETAMINOPHEN 160 MG/5ML PO SOLN: 1000.0000 mg | Freq: Four times a day (QID) | ORAL | Status: DC

## 2017-03-30 MED ORDER — PROTAMINE SULFATE 10 MG/ML IV SOLN
INTRAVENOUS | Status: AC
  Filled 2017-03-30: qty 5

## 2017-03-30 MED ORDER — FENTANYL CITRATE (PF) 250 MCG/5ML IJ SOLN
INTRAMUSCULAR | Status: DC | PRN
  Administered 2017-03-30 (×2): 100 ug via INTRAVENOUS
  Administered 2017-03-30: 250 ug via INTRAVENOUS
  Administered 2017-03-30 (×2): 100 ug via INTRAVENOUS
  Administered 2017-03-30: 200 ug via INTRAVENOUS
  Administered 2017-03-30: 150 ug via INTRAVENOUS
  Administered 2017-03-30 (×2): 100 ug via INTRAVENOUS
  Administered 2017-03-30 (×2): 150 ug via INTRAVENOUS

## 2017-03-30 MED ORDER — INSULIN REGULAR BOLUS VIA INFUSION
0.0000 [IU] | Freq: Three times a day (TID) | INTRAVENOUS | Status: DC
  Filled 2017-03-30: qty 10

## 2017-03-30 MED ORDER — LACTATED RINGERS IV SOLN
INTRAVENOUS | Status: DC
  Administered 2017-03-31: 10:00:00 via INTRAVENOUS

## 2017-03-30 MED ORDER — DEXTROSE 50 % IV SOLN
INTRAVENOUS | Status: AC
  Administered 2017-03-30: 14 mL
  Filled 2017-03-30: qty 50

## 2017-03-30 MED ORDER — METOPROLOL TARTRATE 5 MG/5ML IV SOLN
2.5000 mg | INTRAVENOUS | Status: DC | PRN
  Administered 2017-04-01 (×2): 2.5 mg via INTRAVENOUS
  Filled 2017-03-30 (×2): qty 5

## 2017-03-30 MED ORDER — SODIUM CHLORIDE 0.9 % IV SOLN
0.0000 ug/min | INTRAVENOUS | Status: DC
  Filled 2017-03-30: qty 2

## 2017-03-30 MED ORDER — DOCUSATE SODIUM 100 MG PO CAPS
200.0000 mg | ORAL_CAPSULE | Freq: Every day | ORAL | Status: DC
  Administered 2017-03-31 – 2017-04-03 (×4): 200 mg via ORAL
  Filled 2017-03-30 (×4): qty 2

## 2017-03-30 MED ORDER — ALBUMIN HUMAN 5 % IV SOLN
INTRAVENOUS | Status: DC | PRN
  Administered 2017-03-30: 17:00:00 via INTRAVENOUS

## 2017-03-30 MED ORDER — 0.9 % SODIUM CHLORIDE (POUR BTL) OPTIME
TOPICAL | Status: DC | PRN
  Administered 2017-03-30: 5000 mL

## 2017-03-30 MED ORDER — PHENYLEPHRINE 40 MCG/ML (10ML) SYRINGE FOR IV PUSH (FOR BLOOD PRESSURE SUPPORT)
PREFILLED_SYRINGE | INTRAVENOUS | Status: AC
  Filled 2017-03-30: qty 10

## 2017-03-30 MED ORDER — LIDOCAINE 2% (20 MG/ML) 5 ML SYRINGE
INTRAMUSCULAR | Status: AC
  Filled 2017-03-30: qty 5

## 2017-03-30 MED ORDER — DEXTROSE 5 % IV SOLN
1.5000 g | Freq: Two times a day (BID) | INTRAVENOUS | Status: AC
  Administered 2017-03-30 – 2017-04-01 (×4): 1.5 g via INTRAVENOUS
  Filled 2017-03-30 (×4): qty 1.5

## 2017-03-30 MED ORDER — ASPIRIN 81 MG PO CHEW: 324.0000 mg | CHEWABLE_TABLET | Freq: Every day | ORAL | Status: DC

## 2017-03-30 MED ORDER — ALBUMIN HUMAN 5 % IV SOLN
250.0000 mL | INTRAVENOUS | Status: AC | PRN
  Administered 2017-03-30 (×2): 250 mL via INTRAVENOUS

## 2017-03-30 MED ORDER — ONDANSETRON HCL 4 MG/2ML IJ SOLN: 4.0000 mg | Freq: Four times a day (QID) | INTRAMUSCULAR | Status: DC | PRN

## 2017-03-30 MED ORDER — METOPROLOL TARTRATE 25 MG/10 ML ORAL SUSPENSION
12.5000 mg | Freq: Two times a day (BID) | ORAL | Status: DC
  Filled 2017-03-30: qty 5

## 2017-03-30 MED ORDER — MIDAZOLAM HCL 10 MG/2ML IJ SOLN
INTRAMUSCULAR | Status: AC
  Filled 2017-03-30: qty 2

## 2017-03-30 MED ORDER — DEXMEDETOMIDINE HCL IN NACL 400 MCG/100ML IV SOLN
0.0000 ug/kg/h | INTRAVENOUS | Status: DC
  Administered 2017-03-30: 0.4 ug/kg/h via INTRAVENOUS
  Filled 2017-03-30: qty 100

## 2017-03-30 MED ORDER — MORPHINE SULFATE (PF) 4 MG/ML IV SOLN: 1.0000 mg | INTRAVENOUS | Status: AC | PRN

## 2017-03-30 MED ORDER — TRAMADOL HCL 50 MG PO TABS: 50.0000 mg | ORAL_TABLET | ORAL | Status: DC | PRN

## 2017-03-30 MED ORDER — NITROGLYCERIN IN D5W 200-5 MCG/ML-% IV SOLN: 0.0000 ug/min | INTRAVENOUS | Status: DC

## 2017-03-30 MED ORDER — PROTAMINE SULFATE 10 MG/ML IV SOLN
INTRAVENOUS | Status: AC
  Filled 2017-03-30: qty 25

## 2017-03-30 MED ORDER — SODIUM CHLORIDE 0.45 % IV SOLN
INTRAVENOUS | Status: DC | PRN
  Administered 2017-03-30: 18:00:00 via INTRAVENOUS

## 2017-03-30 MED FILL — Nitroglycerin IV Soln 100 MCG/ML in D5W: INTRA_ARTERIAL | Qty: 10 | Status: AC

## 2017-03-30 SURGICAL SUPPLY — 115 items
APPLIER CLIP 9.375 SM OPEN (CLIP) ×3
BAG DECANTER FOR FLEXI CONT (MISCELLANEOUS) ×6 IMPLANT
BANDAGE ACE 4X5 VEL STRL LF (GAUZE/BANDAGES/DRESSINGS) ×3 IMPLANT
BANDAGE ACE 6X5 VEL STRL LF (GAUZE/BANDAGES/DRESSINGS) ×3 IMPLANT
BASKET HEART (ORDER IN 25'S) (MISCELLANEOUS) ×1
BASKET HEART (ORDER IN 25S) (MISCELLANEOUS) ×2 IMPLANT
BLADE CLIPPER SURG (BLADE) IMPLANT
BLADE STERNUM SYSTEM 6 (BLADE) ×3 IMPLANT
BLADE SURG 11 STRL SS (BLADE) ×3 IMPLANT
BNDG GAUZE ELAST 4 BULKY (GAUZE/BANDAGES/DRESSINGS) ×3 IMPLANT
CANISTER SUCT 3000ML PPV (MISCELLANEOUS) ×3 IMPLANT
CANNULA EZ GLIDE AORTIC 21FR (CANNULA) ×6 IMPLANT
CATH CPB KIT OWEN (MISCELLANEOUS) ×3 IMPLANT
CATH THORACIC 36FR (CATHETERS) ×3 IMPLANT
CLIP APPLIE 9.375 SM OPEN (CLIP) ×2 IMPLANT
CLIP RETRACTION 3.0MM CORONARY (MISCELLANEOUS) ×3 IMPLANT
CLIP VESOCCLUDE MED 24/CT (CLIP) IMPLANT
CLIP VESOCCLUDE SM WIDE 24/CT (CLIP) IMPLANT
COVER MAYO STAND STRL (DRAPES) ×3 IMPLANT
CRADLE DONUT ADULT HEAD (MISCELLANEOUS) ×3 IMPLANT
DRAIN CHANNEL 32F RND 10.7 FF (WOUND CARE) ×6 IMPLANT
DRAPE CARDIOVASCULAR INCISE (DRAPES) ×1
DRAPE INCISE IOBAN 66X45 STRL (DRAPES) ×3 IMPLANT
DRAPE SLUSH/WARMER DISC (DRAPES) ×3 IMPLANT
DRAPE SRG 135X102X78XABS (DRAPES) ×2 IMPLANT
DRSG AQUACEL AG ADV 3.5X14 (GAUZE/BANDAGES/DRESSINGS) ×3 IMPLANT
DRSG COVADERM 4X14 (GAUZE/BANDAGES/DRESSINGS) ×3 IMPLANT
ELECT BLADE 4.0 EZ CLEAN MEGAD (MISCELLANEOUS) ×3
ELECT REM PT RETURN 9FT ADLT (ELECTROSURGICAL) ×6
ELECTRODE BLDE 4.0 EZ CLN MEGD (MISCELLANEOUS) ×2 IMPLANT
ELECTRODE REM PT RTRN 9FT ADLT (ELECTROSURGICAL) ×4 IMPLANT
FELT TEFLON 1X6 (MISCELLANEOUS) ×6 IMPLANT
GAUZE SPONGE 4X4 12PLY STRL (GAUZE/BANDAGES/DRESSINGS) ×6 IMPLANT
GLOVE BIO SURGEON STRL SZ 6.5 (GLOVE) ×12 IMPLANT
GLOVE BIO SURGEON STRL SZ7 (GLOVE) ×6 IMPLANT
GLOVE ORTHO TXT STRL SZ7.5 (GLOVE) ×6 IMPLANT
GLOVE SURG SS PI 6.0 STRL IVOR (GLOVE) ×3 IMPLANT
GOWN STRL REUS W/ TWL LRG LVL3 (GOWN DISPOSABLE) ×20 IMPLANT
GOWN STRL REUS W/TWL LRG LVL3 (GOWN DISPOSABLE) ×10
HEMOSTAT POWDER SURGIFOAM 1G (HEMOSTASIS) ×9 IMPLANT
INSERT FOGARTY XLG (MISCELLANEOUS) ×3 IMPLANT
KIT BASIN OR (CUSTOM PROCEDURE TRAY) ×3 IMPLANT
KIT ROOM TURNOVER OR (KITS) ×3 IMPLANT
KIT SUCTION CATH 14FR (SUCTIONS) ×9 IMPLANT
KIT VASOVIEW HEMOPRO VH 3000 (KITS) ×3 IMPLANT
LEAD PACING MYOCARDI (MISCELLANEOUS) ×3 IMPLANT
MARKER GRAFT CORONARY BYPASS (MISCELLANEOUS) ×9 IMPLANT
NS IRRIG 1000ML POUR BTL (IV SOLUTION) ×15 IMPLANT
PACK OPEN HEART (CUSTOM PROCEDURE TRAY) ×3 IMPLANT
PAD ARMBOARD 7.5X6 YLW CONV (MISCELLANEOUS) ×6 IMPLANT
PAD CARDIAC INSULATION (MISCELLANEOUS) ×3 IMPLANT
PAD ELECT DEFIB RADIOL ZOLL (MISCELLANEOUS) ×3 IMPLANT
PENCIL BUTTON HOLSTER BLD 10FT (ELECTRODE) ×3 IMPLANT
PUNCH AORTIC ROT 4.0MM RCL 40 (MISCELLANEOUS) ×3 IMPLANT
PUNCH AORTIC ROTATE 4.0MM (MISCELLANEOUS) IMPLANT
PUNCH AORTIC ROTATE 4.5MM 8IN (MISCELLANEOUS) IMPLANT
PUNCH AORTIC ROTATE 5MM 8IN (MISCELLANEOUS) IMPLANT
SET CARDIOPLEGIA MPS 5001102 (MISCELLANEOUS) ×3 IMPLANT
SOLUTION ANTI FOG 6CC (MISCELLANEOUS) ×3 IMPLANT
SPONGE LAP 18X18 X RAY DECT (DISPOSABLE) ×6 IMPLANT
SPONGE LAP 4X18 X RAY DECT (DISPOSABLE) IMPLANT
SUT BONE WAX W31G (SUTURE) ×3 IMPLANT
SUT ETHIBOND 2 0 SH (SUTURE) ×4
SUT ETHIBOND 2 0 SH 36X2 (SUTURE) ×8 IMPLANT
SUT ETHIBOND X763 2 0 SH 1 (SUTURE) ×6 IMPLANT
SUT MNCRL AB 3-0 PS2 18 (SUTURE) ×6 IMPLANT
SUT MNCRL AB 4-0 PS2 18 (SUTURE) ×6 IMPLANT
SUT PDS AB 1 CTX 36 (SUTURE) ×6 IMPLANT
SUT PROLENE 2 0 SH DA (SUTURE) IMPLANT
SUT PROLENE 3 0 RB 1 (SUTURE) ×3 IMPLANT
SUT PROLENE 3 0 SH DA (SUTURE) ×3 IMPLANT
SUT PROLENE 3 0 SH1 36 (SUTURE) IMPLANT
SUT PROLENE 4 0 RB 1 (SUTURE) ×3
SUT PROLENE 4 0 SH DA (SUTURE) IMPLANT
SUT PROLENE 4-0 RB1 .5 CRCL 36 (SUTURE) ×6 IMPLANT
SUT PROLENE 5 0 C 1 36 (SUTURE) ×6 IMPLANT
SUT PROLENE 6 0 C 1 30 (SUTURE) ×18 IMPLANT
SUT PROLENE 7.0 RB 3 (SUTURE) ×12 IMPLANT
SUT PROLENE 8 0 BV175 6 (SUTURE) ×6 IMPLANT
SUT PROLENE BLUE 7 0 (SUTURE) ×6 IMPLANT
SUT PROLENE POLY MONO (SUTURE) IMPLANT
SUT SILK  1 MH (SUTURE) ×6
SUT SILK 1 MH (SUTURE) ×12 IMPLANT
SUT SILK 1 TIES 10X30 (SUTURE) ×3 IMPLANT
SUT SILK 2 0 SH CR/8 (SUTURE) ×6 IMPLANT
SUT SILK 2 0 TIES 10X30 (SUTURE) ×3 IMPLANT
SUT SILK 2 0 TIES 17X18 (SUTURE) ×1
SUT SILK 2-0 18XBRD TIE BLK (SUTURE) ×2 IMPLANT
SUT SILK 3 0 SH CR/8 (SUTURE) ×3 IMPLANT
SUT SILK 4 0 TIE 10X30 (SUTURE) ×6 IMPLANT
SUT STEEL 6MS V (SUTURE) IMPLANT
SUT STEEL STERNAL CCS#1 18IN (SUTURE) IMPLANT
SUT STEEL SZ 6 DBL 3X14 BALL (SUTURE) IMPLANT
SUT TEM PAC WIRE 2 0 SH (SUTURE) ×12 IMPLANT
SUT VIC AB 1 CTX 36 (SUTURE)
SUT VIC AB 1 CTX36XBRD ANBCTR (SUTURE) IMPLANT
SUT VIC AB 2-0 CT1 27 (SUTURE) ×2
SUT VIC AB 2-0 CT1 TAPERPNT 27 (SUTURE) ×4 IMPLANT
SUT VIC AB 2-0 CTX 27 (SUTURE) ×6 IMPLANT
SUT VIC AB 3-0 SH 27 (SUTURE)
SUT VIC AB 3-0 SH 27X BRD (SUTURE) IMPLANT
SUT VIC AB 3-0 X1 27 (SUTURE) ×6 IMPLANT
SUT VICRYL 4-0 PS2 18IN ABS (SUTURE) IMPLANT
SUTURE E-PAK OPEN HEART (SUTURE) ×3 IMPLANT
SYSTEM SAHARA CHEST DRAIN ATS (WOUND CARE) ×6 IMPLANT
TAPE CLOTH SURG 4X10 WHT LF (GAUZE/BANDAGES/DRESSINGS) ×3 IMPLANT
TAPE PAPER 2X10 WHT MICROPORE (GAUZE/BANDAGES/DRESSINGS) ×3 IMPLANT
TOWEL GREEN STERILE (TOWEL DISPOSABLE) ×9 IMPLANT
TOWEL GREEN STERILE FF (TOWEL DISPOSABLE) ×6 IMPLANT
TOWEL OR 17X24 6PK STRL BLUE (TOWEL DISPOSABLE) ×3 IMPLANT
TOWEL OR 17X26 10 PK STRL BLUE (TOWEL DISPOSABLE) ×6 IMPLANT
TRAY FOLEY SILVER 16FR TEMP (SET/KITS/TRAYS/PACK) ×3 IMPLANT
TUBING INSUFFLATION (TUBING) ×3 IMPLANT
UNDERPAD 30X30 (UNDERPADS AND DIAPERS) ×3 IMPLANT
WATER STERILE IRR 1000ML POUR (IV SOLUTION) ×6 IMPLANT

## 2017-03-30 NOTE — OR Nursing (Signed)
Forty-five minute call to SICU charge nurse at 1641. Spoke to [place].

## 2017-03-30 NOTE — Progress Notes (Signed)
Pre Procedure note for inpatients:   Maverik Foot has been scheduled for Procedure(s): CORONARY ARTERY BYPASS GRAFTING (CABG) (N/A) TRANSESOPHAGEAL ECHOCARDIOGRAM (TEE) (N/A) today. The various methods of treatment have been discussed with the patient. After consideration of the risks, benefits and treatment options the patient has consented to the planned procedure.   The patient has been seen and labs reviewed. There are no changes in the patient's condition to prevent proceeding with the planned procedure today.  Recent labs:  Lab Results  Component Value Date   WBC 8.4 03/30/2017   HGB 13.0 03/30/2017   HCT 38.9 (L) 03/30/2017   PLT 211 03/30/2017   GLUCOSE 214 (H) 03/30/2017   CHOL 177 03/30/2017   TRIG 793 (H) 03/30/2017   HDL 25 (L) 03/30/2017   LDLCALC UNABLE TO CALCULATE IF TRIGLYCERIDE OVER 400 mg/dL 03/30/2017   ALT 16 (L) 03/29/2017   AST 24 03/29/2017   NA 135 03/30/2017   K 3.6 03/30/2017   CL 102 03/30/2017   CREATININE 0.87 03/30/2017   BUN 8 03/30/2017   CO2 26 03/30/2017   INR 1.04 03/30/2017   HGBA1C 8.6 (H) 03/30/2017    Rexene Alberts, MD 03/30/2017 9:19 AM

## 2017-03-30 NOTE — Brief Op Note (Signed)
03/28/2017 - 03/30/2017  5:10 PM  PATIENT:  Bryce Powell  76 y.o. male  PRE-OPERATIVE DIAGNOSIS:  CAD  POST-OPERATIVE DIAGNOSIS:  CAD  PROCEDURE:  Procedure(s): CORONARY ARTERY BYPASS GRAFTING (CABG) x Three, using left internal mammary artery and right leg greater saphenous vein harvested endoscopically (N/A) TRANSESOPHAGEAL ECHOCARDIOGRAM (TEE) (N/A)  SVG to PL #2 SVG to OM #1 LIMA to LAD  SURGEON:  Surgeon(s) and Role:    * Rexene Alberts, MD - Primary  PHYSICIAN ASSISTANT:  Nicholes Rough, PA-C   ANESTHESIA:   general  EBL: TBD  BLOOD ADMINISTERED:none  DRAINS: routine   LOCAL MEDICATIONS USED:  NONE  SPECIMEN:  No Specimen  DISPOSITION OF SPECIMEN:  N/A  COUNTS:  YES  TOURNIQUET:  * No tourniquets in log *  DICTATION: .Dragon Dictation  PLAN OF CARE: Admit to inpatient   PATIENT DISPOSITION:  ICU - intubated and hemodynamically stable.   Delay start of Pharmacological VTE agent (>24hrs) due to surgical blood loss or risk of bleeding: yes

## 2017-03-30 NOTE — Anesthesia Procedure Notes (Signed)
Central Venous Catheter Insertion Performed by: Roberts Gaudy, MD, anesthesiologist Start/End11/16/2018 10:50 AM, 03/30/2017 11:55 AM Preanesthetic checklist: patient identified, IV checked, site marked, risks and benefits discussed, surgical consent, monitors and equipment checked, pre-op evaluation and timeout performed Position: supine Catheter size: 8 Fr Central line was placed.Double lumen Procedure performed using ultrasound guided technique. Ultrasound Notes:image(s) printed for medical record Attempts: 1 Following insertion, line sutured, dressing applied and Biopatch. Post procedure assessment: blood return through all ports, free fluid flow and no air  Patient tolerated the procedure well with no immediate complications.

## 2017-03-30 NOTE — Progress Notes (Signed)
  Echocardiogram Echocardiogram Transesophageal has been performed.  Bryce Powell 03/30/2017, 2:51 PM

## 2017-03-30 NOTE — Progress Notes (Signed)
CARDIOLOGY NOTE   Patient is comfortable.  Family is at bedside.  Stable overnight.  No recurrent chest pain.  Vital signs are stable.  No gallop is heard.  No neck vein distention.  Lungs are clear.  Last troponin was 1.42 at 10:24 PM.  Post cath creatinine 0.87.  No ECG is available this morning. Coronary Diagrams   Diagnostic Diagram         Critical three-vessel coronary disease including tight left main for surgery later this morning.  Patient and family have been well informed concerning the risks of the procedure and seemed to understand this treatment option.  Plan to follow the patient postop if any specific cardiac issues arise.

## 2017-03-30 NOTE — Anesthesia Procedure Notes (Signed)
Central Venous Catheter Insertion Performed by: Roberts Gaudy, MD, anesthesiologist Start/End11/16/2018 11:50 AM, 03/30/2017 11:55 AM Patient location: Pre-op. Preanesthetic checklist: patient identified, IV checked, site marked, risks and benefits discussed, surgical consent, monitors and equipment checked, pre-op evaluation, timeout performed and anesthesia consent Position: supine Lidocaine 1% used for infiltration and patient sedated Hand hygiene performed , maximum sterile barriers used  and Seldinger technique used Catheter size: 8.5 Fr Total catheter length 10. Central line was placed.Sheath introducer Swan type:thermodilution PA Cath depth:50 Procedure performed using ultrasound guided technique. Ultrasound Notes:anatomy identified, needle tip was noted to be adjacent to the nerve/plexus identified, no ultrasound evidence of intravascular and/or intraneural injection and image(s) printed for medical record Attempts: 1 Following insertion, line sutured and dressing applied. Post procedure assessment: blood return through all ports, free fluid flow and no air  Patient tolerated the procedure well with no immediate complications.

## 2017-03-30 NOTE — Op Note (Signed)
CARDIOTHORACIC SURGERY OPERATIVE NOTE  Date of Procedure: 03/30/2017  Preoperative Diagnosis:   Severe Left Main and 3-vessel Coronary Artery Disease  S/P Acute Non-STEMI  Postoperative Diagnosis: Same  Procedure:    Coronary Artery Bypass Grafting x 3   Left Internal Mammary Artery to Distal Left Anterior Descending Coronary Artery  Saphenous Vein Graft to 2nd Left Posterolateral Branch Coronary Artery  Saphenous Vein Graft to First Obtuse Marginal Branch of Left Circumflex Coronary Artery  Endoscopic Vein Harvest from Right Thigh and Lower Leg  Surgeon: Valentina Gu. Roxy Manns, MD  Assistant: Nicholes Rough, PA-C  Anesthesia: Roberts Gaudy, MD  Operative Findings:  Mild left ventricular systolic dysfunction, EF 97%  Good quality left internal mammary artery conduit  Good quality saphenous vein conduit  Diffuse CAD with only fair quality target vessels for grafting    BRIEF CLINICAL NOTE AND INDICATIONS FOR SURGERY  Patient is a 76 year old male with no previous cardiac history but risk factors notable for history of hypertension, type 2 diabetes mellitus, and long-standing tobacco abuse who describes greater than 1 year history of progressive symptoms of exertional shortness of breath and developed sudden onset yesterday of substernal chest pain and severe shortness of breath.  Symptoms persisted until he was given aspirin in route to the emergency department.  By the time he arrived chest pain had resolved.  EKG revealed sinus rhythm with ST segment depression in lateral leads.  Troponins were elevated and peaked at 1.96 early this morning.  Patient has not had recurrent symptoms of substernal chest pain since admission.  I have personally reviewed the diagnostic cardiac catheterization performed by Dr. Gwenlyn Found.  The patient has severe left main and three-vessel coronary artery disease with preserved left ventricular systolic function.  The patient has been seen in consultation and  counseled at length regarding the indications, risks and potential benefits of surgery.  All questions have been answered, and the patient provides full informed consent for the operation as described.     DETAILS OF THE OPERATIVE PROCEDURE  Preparation:  The patient is brought to the operating room on the above mentioned date and central monitoring was established by the anesthesia team including placement of Swan-Ganz catheter and radial arterial line. The patient is placed in the supine position on the operating table.  Intravenous antibiotics are administered. General endotracheal anesthesia is induced uneventfully. A Foley catheter is placed.  Baseline transesophageal echocardiogram was performed.  Findings were notable for mild LV systolic dysfunction with EF estimated 50%.  The patient's chest, abdomen, both groins, and both lower extremities are prepared and draped in a sterile manner. A time out procedure is performed.   Surgical Approach and Conduit Harvest:  A median sternotomy incision was performed and the left internal mammary artery is dissected from the chest wall and prepared for bypass grafting. The left internal mammary artery is notably good quality conduit. Simultaneously, the greater saphenous vein is obtained from the patient's right thigh and leg using endoscopic vein harvest technique. The saphenous vein is notably good quality conduit. After removal of the saphenous vein, the small surgical incisions in the lower extremity are closed with absorbable suture. Following systemic heparinization, the left internal mammary artery was transected distally noted to have excellent flow.   Extracorporeal Cardiopulmonary Bypass and Myocardial Protection:  The pericardium is opened. The ascending aorta is diseased in appearance with hard palpable plaque in the transverse aortic arch. The ascending aorta and the right atrium are cannulated for cardiopulmonary bypass.  Adequate  heparinization is verified.     The entire pre-bypass portion of the operation was notable for stable hemodynamics.  Cardiopulmonary bypass was begun and the surface of the heart is inspected. Distal target vessels are selected for coronary artery bypass grafting. A cardioplegia cannula is placed in the ascending aorta.  A temperature probe was placed in the interventricular septum.  The patient is allowed to cool passively to [facility] systemic temperature.  The aortic cross clamp is applied and cold blood cardioplegia is delivered initially in an antegrade fashion through the aortic root.  Iced saline slush is applied for topical hypothermia.  The initial cardioplegic arrest is rapid with early diastolic arrest.  Repeat doses of cardioplegia are administered intermittently throughout the entire cross clamp portion of the operation through the aortic root and through subsequently placed vein grafts in order to maintain completely flat electrocardiogram and septal myocardial temperature below 15C.  Myocardial protection was felt to be excellent.  Coronary Artery Bypass Grafting:   The 2nd posterolateral branch of the left circumflex coronary artery was grafted using a reversed saphenous vein graft in an end-to-side fashion.  At the site of distal anastomosis the target vessel was fair quality and measured approximately 2.0 mm in diameter.  This was the dominant terminal branch of the inferior wall.  The first obtuse marginal branch of the left circumflex coronary artery was grafted using a reversed saphenous vein graft in an end-to-side fashion.  At the site of distal anastomosis the target vessel was fair quality and measured approximately 1.7 mm in diameter.  The distal left anterior coronary artery was grafted with the left internal mammary artery in an end-to-side fashion.  At the site of distal anastomosis the target vessel was fair quality and measured approximately 1.7 mm in diameter.  All  proximal vein graft anastomoses were placed directly to the ascending aorta prior to removal of the aortic cross clamp.  The septal myocardial temperature rose rapidly after reperfusion of the left internal mammary artery graft.  The aortic cross clamp was removed after a total cross clamp time of 63 minutes.   Procedure Completion:  All proximal and distal coronary anastomoses were inspected for hemostasis and appropriate graft orientation. Epicardial pacing wires are fixed to the right ventricular outflow tract and to the right atrial appendage. The patient is rewarmed to 37C temperature. The patient is weaned and disconnected from cardiopulmonary bypass.  The patient's rhythm at separation from bypass was sinus.  The patient was weaned from cardiopulmonary bypass without any inotropic support. Total cardiopulmonary bypass time for the operation was 80 minutes.  Followup transesophageal echocardiogram performed after separation from bypass revealed no changes from the preoperative exam.  The aortic and venous cannula were removed uneventfully. Protamine was administered to reverse the anticoagulation. The mediastinum and pleural space were inspected for hemostasis and irrigated with saline solution. The mediastinum and both pleural spaces were drained using 4 chest tubes placed through separate stab incisions inferiorly.  The soft tissues anterior to the aorta were reapproximated loosely. The sternum is closed with double strength sternal wire. The soft tissues anterior to the sternum were closed in multiple layers and the skin is closed with a running subcuticular skin closure.  The post-bypass portion of the operation was notable for stable rhythm and hemodynamics.  No blood products were administered during the operation.   Disposition:  The patient tolerated the procedure well and is transported to the surgical intensive care in stable condition. There are no intraoperative complications.  All sponge instrument and needle counts are verified correct at completion of the operation.    Valentina Gu. Roxy Manns MD 03/30/2017 5:21 PM

## 2017-03-30 NOTE — Anesthesia Preprocedure Evaluation (Addendum)
Anesthesia Evaluation  Patient identified by MRN, date of birth, ID band Patient awake    Reviewed: Allergy & Precautions, NPO status , Patient's Chart, lab work & pertinent test results  Airway Mallampati: II  TM Distance: >3 FB Neck ROM: Full    Dental  (+) Teeth Intact, Dental Advisory Given   Pulmonary    breath sounds clear to auscultation       Cardiovascular hypertension,  Rhythm:Regular Rate:Normal     Neuro/Psych    GI/Hepatic   Endo/Other  diabetes  Renal/GU      Musculoskeletal   Abdominal   Peds  Hematology   Anesthesia Other Findings   Reproductive/Obstetrics                             Anesthesia Physical Anesthesia Plan  ASA: III  Anesthesia Plan: General   Post-op Pain Management:    Induction: Intravenous  PONV Risk Score and Plan: Ondansetron and Midazolam  Airway Management Planned: Oral ETT  Additional Equipment:   Intra-op Plan:   Post-operative Plan: Post-operative intubation/ventilation  Informed Consent: I have reviewed the patients History and Physical, chart, labs and discussed the procedure including the risks, benefits and alternatives for the proposed anesthesia with the patient or authorized representative who has indicated his/her understanding and acceptance.   Dental advisory given  Plan Discussed with:   Anesthesia Plan Comments:         Anesthesia Quick Evaluation

## 2017-03-30 NOTE — Anesthesia Procedure Notes (Signed)
Arterial Line Insertion Start/End11/16/2018 10:15 AM, 03/30/2017 10:32 AM Performed by: Josephine Igo, CRNA, CRNA  Patient location: Pre-op. Preanesthetic checklist: patient identified, IV checked, risks and benefits discussed and surgical consent Lidocaine 1% used for infiltration Left, radial was placed Catheter size: 20 G Hand hygiene performed  and maximum sterile barriers used   Attempts: 1 Procedure performed without using ultrasound guided technique. Following insertion, dressing applied and Biopatch. Post procedure assessment: normal  Patient tolerated the procedure well with no immediate complications.

## 2017-03-30 NOTE — OR Nursing (Signed)
Twenty minute call to SICU charge nurse at 1708. Spoke to [place]. Cath Lab also notified.

## 2017-03-30 NOTE — Transfer of Care (Signed)
Immediate Anesthesia Transfer of Care Note  Patient: Medard Decuir  Procedure(s) Performed: CORONARY ARTERY BYPASS GRAFTING (CABG) x Three, using left internal mammary artery and right leg greater saphenous vein harvested endoscopically (N/A Chest) TRANSESOPHAGEAL ECHOCARDIOGRAM (TEE) (N/A )  Patient Location: SICU  Anesthesia Type:General  Level of Consciousness: sedated and Patient remains intubated per anesthesia plan  Airway & Oxygen Therapy: Patient placed on Ventilator (see vital sign flow sheet for setting)  Post-op Assessment: Report given to RN and Post -op Vital signs reviewed and stable  Post vital signs: Reviewed and stable  Last Vitals:  Vitals:   03/30/17 0800 03/30/17 1740  BP: 131/70 (!) 87/56  Pulse:  80  Resp: 19 12  Temp:    SpO2:  98%    Last Pain:  Vitals:   03/30/17 0407  TempSrc: Oral  PainSc:          Complications: No apparent anesthesia complications

## 2017-03-30 NOTE — Anesthesia Procedure Notes (Signed)
Procedure Name: Intubation Date/Time: 03/30/2017 12:53 PM Performed by: Eligha Bridegroom, CRNA Pre-anesthesia Checklist: Patient identified, Emergency Drugs available, Suction available, Patient being monitored and Timeout performed Patient Re-evaluated:Patient Re-evaluated prior to induction Oxygen Delivery Method: Circle system utilized Preoxygenation: Pre-oxygenation with 100% oxygen Induction Type: IV induction Ventilation: Mask ventilation without difficulty and Oral airway inserted - appropriate to patient size Laryngoscope Size: Mac and 4 Grade View: Grade I Tube type: Oral Tube size: 8.0 mm Number of attempts: 1 Airway Equipment and Method: Stylet Placement Confirmation: ETT inserted through vocal cords under direct vision,  positive ETCO2 and breath sounds checked- equal and bilateral Secured at: 22 cm Tube secured with: Tape Dental Injury: Teeth and Oropharynx as per pre-operative assessment

## 2017-03-30 NOTE — Anesthesia Procedure Notes (Signed)
Central Venous Catheter Insertion Performed by: Roberts Gaudy, MD, anesthesiologist Start/End11/16/2018 11:50 AM, 03/30/2017 11:55 AM Patient location: Pre-op. Preanesthetic checklist: patient identified, IV checked, site marked, risks and benefits discussed, surgical consent, monitors and equipment checked, pre-op evaluation, timeout performed and anesthesia consent Position: supine Hand hygiene performed  and maximum sterile barriers used  Catheter size: 8.5 Fr PA cath was placed.Swan type:thermodilution Procedure performed without using ultrasound guided technique. Ultrasound Notes:anatomy identified, needle tip was noted to be adjacent to the nerve/plexus identified, no ultrasound evidence of intravascular and/or intraneural injection and image(s) printed for medical record Attempts: 1 Following insertion, line sutured, dressing applied and Biopatch. Post procedure assessment: blood return through all ports and free fluid flow  Patient tolerated the procedure well with no immediate complications.

## 2017-03-30 NOTE — Progress Notes (Signed)
CT surgery p.m. Rounds  Patient intubated but starting to wean ventilator after CABG Hemodynamic stable Satisfactory urine output Minimal chest tube drainage

## 2017-03-30 NOTE — Procedures (Signed)
Extubation Procedure Note  Patient Details:   Name: Bryce Powell DOB: [birth year] MRN: [id number]   Airway Documentation:     Evaluation  O2 sats: stable throughout Complications: No apparent complications Patient did tolerate procedure well. Bilateral Breath Sounds: Clear, Diminished   Yes, pt able to cough to clear secretions. Pt had NIF of -35 and VC of 8-9L Pt positive for cuff leak before extubation. Placed on 5L nasal cannula and tolerating well at this time.   Virgilio Frees 03/30/2017, 10:11 PM

## 2017-03-30 NOTE — Progress Notes (Signed)
Rapid Wean Protocol started at this time.

## 2017-03-31 ENCOUNTER — Inpatient Hospital Stay (HOSPITAL_COMMUNITY): Payer: Medicare Other

## 2017-03-31 DIAGNOSIS — Z951 Presence of aortocoronary bypass graft: Secondary | ICD-10-CM

## 2017-03-31 LAB — CBC
HCT: 32.8 % — ABNORMAL LOW (ref 39.0–52.0)
HCT: 32.7 % — ABNORMAL LOW (ref 39.0–52.0)
Hemoglobin: 10.7 g/dL — ABNORMAL LOW (ref 13.0–17.0)
Hemoglobin: 10.6 g/dL — ABNORMAL LOW (ref 13.0–17.0)
MCH: 28.2 pg (ref 26.0–34.0)
MCH: 28.4 pg (ref 26.0–34.0)
MCHC: 32.6 g/dL (ref 30.0–36.0)
MCHC: 32.4 g/dL (ref 30.0–36.0)
MCV: 86.5 fL (ref 78.0–100.0)
MCV: 87.7 fL (ref 78.0–100.0)
Platelets: 158 10*3/uL (ref 150–400)
Platelets: 148 10*3/uL — ABNORMAL LOW (ref 150–400)
RBC: 3.79 MIL/uL — ABNORMAL LOW (ref 4.22–5.81)
RBC: 3.73 MIL/uL — ABNORMAL LOW (ref 4.22–5.81)
RDW: 15.3 % (ref 11.5–15.5)
RDW: 15.3 % (ref 11.5–15.5)
WBC: 12.4 10*3/uL — ABNORMAL HIGH (ref 4.0–10.5)
WBC: 11.4 10*3/uL — ABNORMAL HIGH (ref 4.0–10.5)

## 2017-03-31 LAB — POCT I-STAT, CHEM 8
BUN: 8 mg/dL (ref 6–20)
Calcium, Ion: 1.13 mmol/L — ABNORMAL LOW (ref 1.15–1.40)
Chloride: 101 mmol/L (ref 101–111)
Creatinine, Ser: 0.7 mg/dL (ref 0.61–1.24)
Glucose, Bld: 163 mg/dL — ABNORMAL HIGH (ref 65–99)
HCT: 31 % — ABNORMAL LOW (ref 39.0–52.0)
Hemoglobin: 10.5 g/dL — ABNORMAL LOW (ref 13.0–17.0)
Potassium: 3.7 mmol/L (ref 3.5–5.1)
Sodium: 137 mmol/L (ref 135–145)
TCO2: 24 mmol/L (ref 22–32)

## 2017-03-31 LAB — GLUCOSE, CAPILLARY
Glucose-Capillary: 101 mg/dL — ABNORMAL HIGH (ref 65–99)
Glucose-Capillary: 103 mg/dL — ABNORMAL HIGH (ref 65–99)
Glucose-Capillary: 104 mg/dL — ABNORMAL HIGH (ref 65–99)
Glucose-Capillary: 113 mg/dL — ABNORMAL HIGH (ref 65–99)
Glucose-Capillary: 123 mg/dL — ABNORMAL HIGH (ref 65–99)
Glucose-Capillary: 126 mg/dL — ABNORMAL HIGH (ref 65–99)
Glucose-Capillary: 135 mg/dL — ABNORMAL HIGH (ref 65–99)
Glucose-Capillary: 149 mg/dL — ABNORMAL HIGH (ref 65–99)
Glucose-Capillary: 151 mg/dL — ABNORMAL HIGH (ref 65–99)
Glucose-Capillary: 151 mg/dL — ABNORMAL HIGH (ref 65–99)
Glucose-Capillary: 153 mg/dL — ABNORMAL HIGH (ref 65–99)
Glucose-Capillary: 168 mg/dL — ABNORMAL HIGH (ref 65–99)
Glucose-Capillary: 169 mg/dL — ABNORMAL HIGH (ref 65–99)
Glucose-Capillary: 177 mg/dL — ABNORMAL HIGH (ref 65–99)
Glucose-Capillary: 90 mg/dL (ref 65–99)
Glucose-Capillary: 96 mg/dL (ref 65–99)

## 2017-03-31 LAB — BASIC METABOLIC PANEL
Anion gap: 8 (ref 5–15)
BUN: 18 mg/dL (ref 6–20)
CO2: 26 mmol/L (ref 22–32)
Calcium: 9 mg/dL (ref 8.9–10.3)
Chloride: 103 mmol/L (ref 101–111)
Creatinine, Ser: 1.36 mg/dL — ABNORMAL HIGH (ref 0.61–1.24)
GFR calc Af Amer: 57 mL/min — ABNORMAL LOW (ref >60)
GFR calc non Af Amer: 49 mL/min — ABNORMAL LOW (ref >60)
Glucose, Bld: 128 mg/dL — ABNORMAL HIGH (ref 65–99)
Potassium: 4.1 mmol/L (ref 3.5–5.1)
Sodium: 137 mmol/L (ref 135–145)

## 2017-03-31 LAB — POCT I-STAT 3, ART BLOOD GAS (G3+)
Acid-base deficit: 1 mmol/L (ref 0.0–2.0)
Bicarbonate: 23.7 mmol/L (ref 20.0–28.0)
O2 Saturation: 95 %
Patient temperature: 36.6
TCO2: 25 mmol/L (ref 22–32)
pCO2 arterial: 37.9 mmHg (ref 32.0–48.0)
pH, Arterial: 7.402 (ref 7.350–7.450)
pO2, Arterial: 76 mmHg — ABNORMAL LOW (ref 83.0–108.0)

## 2017-03-31 LAB — MAGNESIUM
Magnesium: 2.3 mg/dL (ref 1.7–2.4)
Magnesium: 2 mg/dL (ref 1.7–2.4)

## 2017-03-31 LAB — CREATININE, SERUM
Creatinine, Ser: 0.86 mg/dL (ref 0.61–1.24)
GFR calc Af Amer: >60 mL/min (ref >60)
GFR calc non Af Amer: >60 mL/min (ref >60)

## 2017-03-31 MED ORDER — FUROSEMIDE 10 MG/ML IJ SOLN: 20.0000 mg | Freq: Two times a day (BID) | INTRAMUSCULAR | Status: DC

## 2017-03-31 MED ORDER — INSULIN ASPART 100 UNIT/ML ~~LOC~~ SOLN
0.0000 [IU] | SUBCUTANEOUS | Status: DC
  Administered 2017-03-31: 2 [IU] via SUBCUTANEOUS
  Administered 2017-03-31: 4 [IU] via SUBCUTANEOUS
  Administered 2017-03-31: 2 [IU] via SUBCUTANEOUS
  Administered 2017-04-01: 8 [IU] via SUBCUTANEOUS
  Administered 2017-04-01 (×4): 2 [IU] via SUBCUTANEOUS
  Administered 2017-04-01: 8 [IU] via SUBCUTANEOUS
  Administered 2017-04-01: 4 [IU] via SUBCUTANEOUS

## 2017-03-31 MED ORDER — FUROSEMIDE 10 MG/ML IJ SOLN
40.0000 mg | Freq: Two times a day (BID) | INTRAMUSCULAR | Status: DC
  Administered 2017-03-31 – 2017-04-01 (×3): 40 mg via INTRAVENOUS
  Filled 2017-03-31 (×3): qty 4

## 2017-03-31 MED ORDER — POTASSIUM CHLORIDE 10 MEQ/50ML IV SOLN
10.0000 meq | INTRAVENOUS | Status: AC
  Administered 2017-03-31 (×3): 10 meq via INTRAVENOUS
  Filled 2017-03-31 (×3): qty 50

## 2017-03-31 MED ORDER — LEVALBUTEROL HCL 1.25 MG/0.5ML IN NEBU
1.2500 mg | INHALATION_SOLUTION | Freq: Four times a day (QID) | RESPIRATORY_TRACT | Status: DC
Start: 2017-03-31 — End: 2017-03-31

## 2017-03-31 MED ORDER — PNEUMOCOCCAL VAC POLYVALENT 25 MCG/0.5ML IJ INJ: 0.5000 mL | INJECTION | INTRAMUSCULAR | Status: DC

## 2017-03-31 MED ORDER — GUAIFENESIN ER 600 MG PO TB12
600.0000 mg | ORAL_TABLET | Freq: Two times a day (BID) | ORAL | Status: DC
  Administered 2017-03-31 – 2017-04-04 (×9): 600 mg via ORAL
  Filled 2017-03-31 (×9): qty 1

## 2017-03-31 MED ORDER — MOMETASONE FURO-FORMOTEROL FUM 200-5 MCG/ACT IN AERO
2.0000 | INHALATION_SPRAY | Freq: Two times a day (BID) | RESPIRATORY_TRACT | Status: DC
  Administered 2017-03-31 – 2017-04-01 (×3): 2 via RESPIRATORY_TRACT
  Filled 2017-03-31 (×2): qty 8.8

## 2017-03-31 MED ORDER — LEVALBUTEROL HCL 1.25 MG/0.5ML IN NEBU
1.2500 mg | INHALATION_SOLUTION | Freq: Four times a day (QID) | RESPIRATORY_TRACT | Status: DC
  Administered 2017-03-31 – 2017-04-01 (×5): 1.25 mg via RESPIRATORY_TRACT
  Filled 2017-03-31 (×7): qty 0.5

## 2017-03-31 NOTE — Progress Notes (Signed)
CT surgery p.m. Rounds  Pulmonary status stable Patient examined and record reviewed.Hemodynamics stable,labs satisfactory.Patient had stable day.Continue current care. Bryce Powell 03/31/2017

## 2017-03-31 NOTE — Progress Notes (Signed)
Anesthesiology Follow-up:  Awake and alert, in good spirits, minimal pain. Hemodynamically stable without pressors  VS: T-36.8 BP- 124/72 HR- 84 (SR)  RR-13 O2 Sat 97%  PA 24/8 CO/CI- 5.5/2.5  K-4.1 Na- 137 BUN/Cr.- 18/1.36 H/H- 10.7/32.8 platelets- 158,000  Extubated 4 1/2 hours post-op.   76 year old male with diffuse CAD and DM one day S/P CABG X 3 follow non-stemi 11/14.  Stable post-op course, creatinine increased above baseline, otherwise doing well.  Bryce Powell

## 2017-03-31 NOTE — Progress Notes (Signed)
1 Day Post-Op Procedure(s) (LRB): CORONARY ARTERY BYPASS GRAFTING (CABG) x Three, using left internal mammary artery and right leg greater saphenous vein harvested endoscopically (N/A) TRANSESOPHAGEAL ECHOCARDIOGRAM (TEE) (N/A) Subjective: Extubated with wet cough Edematous nsr  Objective: Vital signs in last 24 hours: Temp:  [97 F (36.1 C)-98.4 F (36.9 C)] 97.9 F (36.6 C) (11/17 0700) Pulse Rate:  [62-91] 87 (11/17 0700) Cardiac Rhythm: Normal sinus rhythm (11/17 0400) Resp:  [8-30] 12 (11/17 0700) BP: (87-145)/(56-82) 119/74 (11/17 0700) SpO2:  [79 %-100 %] 97 % (11/17 0700) Arterial Line BP: (97-172)/(41-93) 155/51 (11/17 0700) FiO2 (%):  [40 %-50 %] 40 % (11/16 2110) Weight:  [231 lb 0.7 oz (104.8 kg)] 231 lb 0.7 oz (104.8 kg) (11/17 0506)  Hemodynamic parameters for last 24 hours: PAP: (16-33)/(8-22) 21/8 CO:  [3.6 L/min-5.5 L/min] 5.5 L/min CI:  [1.6 L/min/m2-2.5 L/min/m2] 2.5 L/min/m2  Intake/Output from previous day: 11/16 0701 - 11/17 0700 In: 5926.3 [I.V.:4526.3; Blood:300; IV Piggyback:1100] Out: 7048 [Urine:2855; Chest Tube:520] Intake/Output this shift: No intake/output data recorded.       Exam    General- alert and comfortable   Lungs- clear without rales, wheezes   Cor- regular rate and rhythm, no murmur , gallop   Abdomen- soft, non-tender   Extremities - warm, non-tender, minimal edema   Neuro- oriented, appropriate, no focal weakness   Lab Results: Recent Labs    03/30/17 1741 03/30/17 1749 03/31/17 0405  WBC 8.9  --  12.4*  HGB 10.7* 10.5* 10.7*  HCT 32.5* 31.0* 32.8*  PLT 127*  --  158   BMET:  Recent Labs    03/30/17 0308 03/30/17 1749 03/31/17 0309  NA 135 143 137  K 3.6 3.6 4.1  CL 102  --  103  CO2 26  --  26  GLUCOSE 214* 125* 128*  BUN 8  --  18  CREATININE 0.87  --  1.36*  CALCIUM 8.6*  --  9.0    PT/INR:  Recent Labs    03/30/17 1741  LABPROT 16.0*  INR 1.29   ABG    Component Value Date/Time   PHART  7.402 03/31/2017 0407   HCO3 23.7 03/31/2017 0407   TCO2 25 03/31/2017 0407   ACIDBASEDEF 1.0 03/31/2017 0407   O2SAT 95.0 03/31/2017 0407   CBG (last 3)  Recent Labs    03/31/17 0620 03/31/17 0720 03/31/17 0825  GLUCAP 104* 96 90    Assessment/Plan: S/P Procedure(s) (LRB): CORONARY ARTERY BYPASS GRAFTING (CABG) x Three, using left internal mammary artery and right leg greater saphenous vein harvested endoscopically (N/A) TRANSESOPHAGEAL ECHOCARDIOGRAM (TEE) (N/A) Mobilize Diuresis Diabetes control d/c tubes/lines See progression orders   LOS: 3 days    Bryce Powell 03/31/2017

## 2017-03-31 NOTE — Anesthesia Postprocedure Evaluation (Signed)
Anesthesia Post Note  Patient: Zakye Baby  Procedure(s) Performed: CORONARY ARTERY BYPASS GRAFTING (CABG) x Three, using left internal mammary artery and right leg greater saphenous vein harvested endoscopically (N/A Chest) TRANSESOPHAGEAL ECHOCARDIOGRAM (TEE) (N/A )     Patient location during evaluation: SICU Anesthesia Type: General Level of consciousness: awake, awake and alert and oriented Pain management: pain level controlled Vital Signs Assessment: post-procedure vital signs reviewed and stable Respiratory status: spontaneous breathing, nonlabored ventilation and respiratory function stable Cardiovascular status: blood pressure returned to baseline Anesthetic complications: no    Last Vitals:  Vitals:   03/31/17 1600 03/31/17 1615  BP: 130/65   Pulse: 85 82  Resp: 20 18  Temp:    SpO2: 94% 95%    Last Pain:  Vitals:   03/31/17 0622  TempSrc:   PainSc: 0-No pain                 Alaa Mullally,Jailin COKER

## 2017-04-01 ENCOUNTER — Encounter (HOSPITAL_COMMUNITY): Payer: Self-pay | Admitting: *Deleted

## 2017-04-01 ENCOUNTER — Inpatient Hospital Stay (HOSPITAL_COMMUNITY): Payer: Medicare Other

## 2017-04-01 ENCOUNTER — Other Ambulatory Visit: Payer: Self-pay

## 2017-04-01 LAB — BASIC METABOLIC PANEL
Anion gap: 7 (ref 5–15)
BUN: 7 mg/dL (ref 6–20)
CO2: 25 mmol/L (ref 22–32)
Calcium: 8.2 mg/dL — ABNORMAL LOW (ref 8.9–10.3)
Chloride: 102 mmol/L (ref 101–111)
Creatinine, Ser: 0.77 mg/dL (ref 0.61–1.24)
GFR calc Af Amer: >60 mL/min (ref >60)
GFR calc non Af Amer: >60 mL/min (ref >60)
Glucose, Bld: 163 mg/dL — ABNORMAL HIGH (ref 65–99)
Potassium: 3.5 mmol/L (ref 3.5–5.1)
Sodium: 134 mmol/L — ABNORMAL LOW (ref 135–145)

## 2017-04-01 LAB — GLUCOSE, CAPILLARY
Glucose-Capillary: 204 mg/dL — ABNORMAL HIGH (ref 65–99)
Glucose-Capillary: 154 mg/dL — ABNORMAL HIGH (ref 65–99)

## 2017-04-01 LAB — CBC
HCT: 32 % — ABNORMAL LOW (ref 39.0–52.0)
Hemoglobin: 10.7 g/dL — ABNORMAL LOW (ref 13.0–17.0)
MCH: 29 pg (ref 26.0–34.0)
MCHC: 33.4 g/dL (ref 30.0–36.0)
MCV: 86.7 fL (ref 78.0–100.0)
Platelets: 156 10*3/uL (ref 150–400)
RBC: 3.69 MIL/uL — ABNORMAL LOW (ref 4.22–5.81)
RDW: 15.1 % (ref 11.5–15.5)
WBC: 11.9 10*3/uL — ABNORMAL HIGH (ref 4.0–10.5)

## 2017-04-01 MED ORDER — METOPROLOL TARTRATE 25 MG PO TABS
25.0000 mg | ORAL_TABLET | Freq: Two times a day (BID) | ORAL | Status: DC
  Administered 2017-04-01 – 2017-04-04 (×7): 25 mg via ORAL
  Filled 2017-04-01 (×6): qty 1

## 2017-04-01 MED ORDER — CHLORHEXIDINE GLUCONATE 0.12 % MT SOLN
OROMUCOSAL | Status: AC
  Filled 2017-04-01: qty 15

## 2017-04-01 MED ORDER — AMLODIPINE BESYLATE 5 MG PO TABS
5.0000 mg | ORAL_TABLET | Freq: Every day | ORAL | Status: DC
  Administered 2017-04-01: 5 mg via ORAL
  Filled 2017-04-01: qty 1

## 2017-04-01 MED ORDER — POTASSIUM CHLORIDE CRYS ER 20 MEQ PO TBCR
20.0000 meq | EXTENDED_RELEASE_TABLET | Freq: Two times a day (BID) | ORAL | Status: DC
  Administered 2017-04-01 (×2): 20 meq via ORAL
  Filled 2017-04-01 (×2): qty 1

## 2017-04-01 MED ORDER — POTASSIUM CHLORIDE 10 MEQ/50ML IV SOLN
INTRAVENOUS | Status: AC
  Administered 2017-04-01: 10 meq via INTRAVENOUS
  Filled 2017-04-01: qty 150

## 2017-04-01 MED ORDER — FUROSEMIDE 10 MG/ML IJ SOLN
40.0000 mg | Freq: Every day | INTRAMUSCULAR | Status: DC
  Filled 2017-04-01: qty 4

## 2017-04-01 MED ORDER — POTASSIUM CHLORIDE 10 MEQ/50ML IV SOLN
10.0000 meq | INTRAVENOUS | Status: AC | PRN
  Administered 2017-04-01 (×3): 10 meq via INTRAVENOUS

## 2017-04-01 NOTE — Progress Notes (Signed)
2 Days Post-Op Procedure(s) (LRB): CORONARY ARTERY BYPASS GRAFTING (CABG) x Three, using left internal mammary artery and right leg greater saphenous vein harvested endoscopically (N/A) TRANSESOPHAGEAL ECHOCARDIOGRAM (TEE) (N/A) Subjective: Progressing- wants to walk Wet cough, CXR clear diuresing well  Objective: Vital signs in last 24 hours: Temp:  [97.7 F (36.5 C)-98.6 F (37 C)] 97.8 F (36.6 C) (11/18 0832) Pulse Rate:  [61-95] 92 (11/18 0832) Cardiac Rhythm: Normal sinus rhythm (11/18 0000) Resp:  [14-27] 20 (11/18 0900) BP: (109-172)/(48-80) 143/70 (11/18 0900) SpO2:  [81 %-100 %] 99 % (11/18 0832) Arterial Line BP: (128-186)/(31-64) 183/46 (11/17 1515) Weight:  [224 lb 3.3 oz (101.7 kg)] 224 lb 3.3 oz (101.7 kg) (11/18 0500)  Hemodynamic parameters for last 24 hours: PAP: (25)/(10) 25/10  Intake/Output from previous day: 11/17 0701 - 11/18 0700 In: 1596.5 [P.O.:720; I.V.:576.5; IV Piggyback:300] Out: 3410 [Urine:3180; Chest Tube:230] Intake/Output this shift: Total I/O In: 100 [P.O.:50; IV Piggyback:50] Out: 170 [Chest Tube:170]       Exam    General- alert and comfortable   Lungs- clear without rales, wheezes   Cor- regular rate and rhythm, no murmur , gallop   Abdomen- soft, non-tender   Extremities - warm, non-tender, minimal edema   Neuro- oriented, appropriate, no focal weakness   Lab Results: Recent Labs    03/31/17 1606 03/31/17 1619 04/01/17 0329  WBC 11.4*  --  11.9*  HGB 10.6* 10.5* 10.7*  HCT 32.7* 31.0* 32.0*  PLT 148*  --  156   BMET:  Recent Labs    03/31/17 0309  03/31/17 1619 04/01/17 0329  NA 137  --  137 134*  K 4.1  --  3.7 3.5  CL 103  --  101 102  CO2 26  --   --  25  GLUCOSE 128*  --  163* 163*  BUN 18  --  8 7  CREATININE 1.36*   < > 0.70 0.77  CALCIUM 9.0  --   --  8.2*   < > = values in this interval not displayed.    PT/INR:  Recent Labs    03/30/17 1741  LABPROT 16.0*  INR 1.29   ABG    Component  Value Date/Time   PHART 7.402 03/31/2017 0407   HCO3 23.7 03/31/2017 0407   TCO2 24 03/31/2017 1619   ACIDBASEDEF 1.0 03/31/2017 0407   O2SAT 95.0 03/31/2017 0407   CBG (last 3)  Recent Labs    03/31/17 1404 03/31/17 1544 03/31/17 2037  GLUCAP 135* 153* 177*    Assessment/Plan: S/P Procedure(s) (LRB): CORONARY ARTERY BYPASS GRAFTING (CABG) x Three, using left internal mammary artery and right leg greater saphenous vein harvested endoscopically (N/A) TRANSESOPHAGEAL ECHOCARDIOGRAM (TEE) (N/A) Mobilize Diuresis Diabetes control d/c tubes/lines increase lopressor to 25 bid   LOS: 4 days    Bryce Powell 04/01/2017

## 2017-04-01 NOTE — Progress Notes (Signed)
CT surgery p.m. Rounds  Patient walking hallway twice maintaining sinus rhythm Productive cough has improved Weaning off oxygen Chest tube drainage decreasing Continue current care

## 2017-04-02 ENCOUNTER — Inpatient Hospital Stay (HOSPITAL_COMMUNITY): Payer: Medicare Other

## 2017-04-02 ENCOUNTER — Encounter (HOSPITAL_COMMUNITY): Payer: Self-pay | Admitting: Thoracic Surgery (Cardiothoracic Vascular Surgery)

## 2017-04-02 LAB — GLUCOSE, CAPILLARY
Glucose-Capillary: 105 mg/dL — ABNORMAL HIGH (ref 65–99)
Glucose-Capillary: 152 mg/dL — ABNORMAL HIGH (ref 65–99)
Glucose-Capillary: 107 mg/dL — ABNORMAL HIGH (ref 65–99)
Glucose-Capillary: 135 mg/dL — ABNORMAL HIGH (ref 65–99)
Glucose-Capillary: 159 mg/dL — ABNORMAL HIGH (ref 65–99)
Glucose-Capillary: 172 mg/dL — ABNORMAL HIGH (ref 65–99)
Glucose-Capillary: 179 mg/dL — ABNORMAL HIGH (ref 65–99)
Glucose-Capillary: 220 mg/dL — ABNORMAL HIGH (ref 65–99)
Glucose-Capillary: 121 mg/dL — ABNORMAL HIGH (ref 65–99)
Glucose-Capillary: 160 mg/dL — ABNORMAL HIGH (ref 65–99)
Glucose-Capillary: 189 mg/dL — ABNORMAL HIGH (ref 65–99)

## 2017-04-02 LAB — BASIC METABOLIC PANEL
Anion gap: 8 (ref 5–15)
BUN: 10 mg/dL (ref 6–20)
CO2: 25 mmol/L (ref 22–32)
Calcium: 8.3 mg/dL — ABNORMAL LOW (ref 8.9–10.3)
Chloride: 102 mmol/L (ref 101–111)
Creatinine, Ser: 0.82 mg/dL (ref 0.61–1.24)
GFR calc Af Amer: >60 mL/min (ref >60)
GFR calc non Af Amer: >60 mL/min (ref >60)
Glucose, Bld: 145 mg/dL — ABNORMAL HIGH (ref 65–99)
Potassium: 3.5 mmol/L (ref 3.5–5.1)
Sodium: 135 mmol/L (ref 135–145)

## 2017-04-02 LAB — POCT I-STAT, CHEM 8
BUN: 10 mg/dL (ref 6–20)
Calcium, Ion: 1.16 mmol/L (ref 1.15–1.40)
Chloride: 98 mmol/L — ABNORMAL LOW (ref 101–111)
Creatinine, Ser: 0.7 mg/dL (ref 0.61–1.24)
Glucose, Bld: 209 mg/dL — ABNORMAL HIGH (ref 65–99)
HCT: 35 % — ABNORMAL LOW (ref 39.0–52.0)
Hemoglobin: 11.9 g/dL — ABNORMAL LOW (ref 13.0–17.0)
Potassium: 3.9 mmol/L (ref 3.5–5.1)
Sodium: 136 mmol/L (ref 135–145)
TCO2: 24 mmol/L (ref 22–32)

## 2017-04-02 LAB — CBC
HCT: 31 % — ABNORMAL LOW (ref 39.0–52.0)
Hemoglobin: 10.2 g/dL — ABNORMAL LOW (ref 13.0–17.0)
MCH: 28.5 pg (ref 26.0–34.0)
MCHC: 32.9 g/dL (ref 30.0–36.0)
MCV: 86.6 fL (ref 78.0–100.0)
Platelets: 166 10*3/uL (ref 150–400)
RBC: 3.58 MIL/uL — ABNORMAL LOW (ref 4.22–5.81)
RDW: 15 % (ref 11.5–15.5)
WBC: 11.1 10*3/uL — ABNORMAL HIGH (ref 4.0–10.5)

## 2017-04-02 MED ORDER — SODIUM CHLORIDE 0.9% FLUSH: 3.0000 mL | INTRAVENOUS | Status: DC | PRN

## 2017-04-02 MED ORDER — AMIODARONE HCL 200 MG PO TABS
400.0000 mg | ORAL_TABLET | Freq: Two times a day (BID) | ORAL | Status: DC
  Administered 2017-04-03 – 2017-04-04 (×3): 400 mg via ORAL
  Filled 2017-04-02 (×3): qty 2

## 2017-04-02 MED ORDER — MOVING RIGHT ALONG BOOK
Freq: Once | Status: AC
  Administered 2017-04-02: 1
  Filled 2017-04-02: qty 1

## 2017-04-02 MED ORDER — AMIODARONE HCL IN DEXTROSE 360-4.14 MG/200ML-% IV SOLN
60.0000 mg/h | INTRAVENOUS | Status: AC
  Administered 2017-04-02 (×2): 60 mg/h via INTRAVENOUS
  Filled 2017-04-02 (×2): qty 200

## 2017-04-02 MED ORDER — AMLODIPINE BESYLATE 5 MG PO TABS
5.0000 mg | ORAL_TABLET | Freq: Every day | ORAL | Status: DC
  Administered 2017-04-02 – 2017-04-04 (×3): 5 mg via ORAL
  Filled 2017-04-02 (×3): qty 1

## 2017-04-02 MED ORDER — ATORVASTATIN CALCIUM 80 MG PO TABS
80.0000 mg | ORAL_TABLET | Freq: Every day | ORAL | Status: DC
  Administered 2017-04-02 – 2017-04-03 (×2): 80 mg via ORAL
  Filled 2017-04-02 (×2): qty 1

## 2017-04-02 MED ORDER — AMIODARONE HCL 200 MG PO TABS: 400.0000 mg | ORAL_TABLET | Freq: Every day | ORAL | Status: DC

## 2017-04-02 MED ORDER — LEVALBUTEROL HCL 1.25 MG/0.5ML IN NEBU: 1.2500 mg | INHALATION_SOLUTION | Freq: Four times a day (QID) | RESPIRATORY_TRACT | Status: DC | PRN

## 2017-04-02 MED ORDER — LOSARTAN POTASSIUM 50 MG PO TABS
50.0000 mg | ORAL_TABLET | Freq: Every day | ORAL | Status: DC
  Administered 2017-04-02 – 2017-04-04 (×3): 50 mg via ORAL
  Filled 2017-04-02 (×3): qty 1

## 2017-04-02 MED ORDER — SODIUM CHLORIDE 0.9 % IV SOLN: 250.0000 mL | INTRAVENOUS | Status: DC | PRN

## 2017-04-02 MED ORDER — ASPIRIN 325 MG PO TABS: 325.0000 mg | ORAL_TABLET | Freq: Every day | ORAL | Status: DC

## 2017-04-02 MED ORDER — INSULIN ASPART 100 UNIT/ML ~~LOC~~ SOLN
0.0000 [IU] | Freq: Three times a day (TID) | SUBCUTANEOUS | Status: DC
  Administered 2017-04-02 – 2017-04-04 (×4): 3 [IU] via SUBCUTANEOUS

## 2017-04-02 MED ORDER — POTASSIUM CHLORIDE CRYS ER 20 MEQ PO TBCR
20.0000 meq | EXTENDED_RELEASE_TABLET | Freq: Three times a day (TID) | ORAL | Status: DC
  Administered 2017-04-02: 20 meq via ORAL
  Filled 2017-04-02: qty 1

## 2017-04-02 MED ORDER — POTASSIUM CHLORIDE CRYS ER 20 MEQ PO TBCR
20.0000 meq | EXTENDED_RELEASE_TABLET | Freq: Every day | ORAL | Status: DC
  Administered 2017-04-03 – 2017-04-04 (×2): 20 meq via ORAL
  Filled 2017-04-02 (×2): qty 1

## 2017-04-02 MED ORDER — METFORMIN HCL 500 MG PO TABS
1000.0000 mg | ORAL_TABLET | Freq: Two times a day (BID) | ORAL | Status: DC
  Administered 2017-04-02 – 2017-04-04 (×5): 1000 mg via ORAL
  Filled 2017-04-02 (×5): qty 2

## 2017-04-02 MED ORDER — ASPIRIN EC 81 MG PO TBEC
81.0000 mg | DELAYED_RELEASE_TABLET | Freq: Every day | ORAL | Status: DC
  Administered 2017-04-02 – 2017-04-04 (×3): 81 mg via ORAL
  Filled 2017-04-02 (×3): qty 1

## 2017-04-02 MED ORDER — POTASSIUM CHLORIDE CRYS ER 20 MEQ PO TBCR
40.0000 meq | EXTENDED_RELEASE_TABLET | Freq: Two times a day (BID) | ORAL | Status: AC
  Administered 2017-04-02 (×2): 40 meq via ORAL
  Filled 2017-04-02 (×2): qty 2

## 2017-04-02 MED ORDER — GLIPIZIDE 5 MG PO TABS
5.0000 mg | ORAL_TABLET | Freq: Every day | ORAL | Status: DC
  Administered 2017-04-02 – 2017-04-04 (×3): 5 mg via ORAL
  Filled 2017-04-02 (×3): qty 1

## 2017-04-02 MED ORDER — AMIODARONE LOAD VIA INFUSION
150.0000 mg | Freq: Once | INTRAVENOUS | Status: AC
  Administered 2017-04-02: 150 mg via INTRAVENOUS
  Filled 2017-04-02: qty 83.34

## 2017-04-02 MED ORDER — FUROSEMIDE 40 MG PO TABS
40.0000 mg | ORAL_TABLET | Freq: Every day | ORAL | Status: DC
  Administered 2017-04-02 – 2017-04-04 (×3): 40 mg via ORAL
  Filled 2017-04-02 (×3): qty 1

## 2017-04-02 MED ORDER — CLOPIDOGREL BISULFATE 75 MG PO TABS
75.0000 mg | ORAL_TABLET | Freq: Every day | ORAL | Status: DC
  Administered 2017-04-02 – 2017-04-04 (×3): 75 mg via ORAL
  Filled 2017-04-02 (×3): qty 1

## 2017-04-02 MED ORDER — AMIODARONE HCL IN DEXTROSE 360-4.14 MG/200ML-% IV SOLN: 30.0000 mg/h | INTRAVENOUS | Status: DC

## 2017-04-02 MED ORDER — SODIUM CHLORIDE 0.9% FLUSH: 3.0000 mL | Freq: Two times a day (BID) | INTRAVENOUS | Status: DC

## 2017-04-02 MED FILL — Phenylephrine HCl Inj 10 MG/ML: INTRAMUSCULAR | Qty: 1 | Status: AC

## 2017-04-02 MED FILL — Potassium Chloride Inj 2 mEq/ML: INTRAVENOUS | Qty: 20 | Status: AC

## 2017-04-02 MED FILL — Magnesium Sulfate Inj 50%: INTRAMUSCULAR | Qty: 10 | Status: AC

## 2017-04-02 MED FILL — Heparin Sodium (Porcine) Inj 1000 Unit/ML: INTRAMUSCULAR | Qty: 30 | Status: AC

## 2017-04-02 MED FILL — Sodium Chloride IV Soln 0.9%: INTRAVENOUS | Qty: 1000 | Status: AC

## 2017-04-02 NOTE — Progress Notes (Signed)
[address]       [place] [address]             [phone]        CARDIOTHORACIC SURGERY PROGRESS NOTE   R3 Days Post-Op Procedure(s) (LRB): CORONARY ARTERY BYPASS GRAFTING (CABG) x Three, using left internal mammary artery and right leg greater saphenous vein harvested endoscopically (N/A) TRANSESOPHAGEAL ECHOCARDIOGRAM (TEE) (N/A)  Subjective: Looks good and feels well.  Minimal soreness in chest.  Denies SOB.  Appetite pretty good.  Already ambulated around SICU  Objective: Vital signs: BP Readings from Last 1 Encounters:  04/02/17 134/83   Pulse Readings from Last 1 Encounters:  04/02/17 86   Resp Readings from Last 1 Encounters:  04/02/17 18   Temp Readings from Last 1 Encounters:  04/02/17 98.4 F (36.9 C) (Oral)    Hemodynamics:    Physical Exam:  Rhythm:   sinus  Breath sounds: clear  Heart sounds:  RRR  Incisions:  Dressing dry, intact  Abdomen:  Soft, non-distended, non-tender  Extremities:  Warm, well-perfused  Chest tubes:  decreasing volume thin serosanguinous output, no air leak    Intake/Output from previous day: 11/18 0701 - 11/19 0700 In: 225 [P.O.:175; IV Piggyback:50] Out: 1935 [Urine:1325; Chest Tube:610] Intake/Output this shift: Total I/O In: -  Out: 10 [Chest Tube:10]  Lab Results:  CBC: Recent Labs    04/01/17 0329 04/02/17 0300  WBC 11.9* 11.1*  HGB 10.7* 10.2*  HCT 32.0* 31.0*  PLT 156 166    BMET:  Recent Labs    04/01/17 0329 04/02/17 0300  NA 134* 135  K 3.5 3.5  CL 102 102  CO2 25 25  GLUCOSE 163* 145*  BUN 7 10  CREATININE 0.77 0.82  CALCIUM 8.2* 8.3*     PT/INR:   Recent Labs    03/30/17 1741  LABPROT 16.0*  INR 1.29    CBG (last 3)  Recent Labs    04/01/17 1648 04/01/17 1957 04/02/17 0348  GLUCAP 204* 154* 105*    ABG    Component Value Date/Time   PHART 7.402 03/31/2017 0407   PCO2ART 37.9 03/31/2017 0407   PO2ART 76.0 (L) 03/31/2017 0407   HCO3 23.7  03/31/2017 0407   TCO2 24 03/31/2017 1619   ACIDBASEDEF 1.0 03/31/2017 0407   O2SAT 95.0 03/31/2017 0407    CXR: PORTABLE CHEST 1 VIEW  COMPARISON:  April 01, 2017  FINDINGS: There is a chest tube on each side, unchanged in position. Central catheter tip is in the superior vena cava. No evident pneumothorax. There is a minimal left pleural effusion with mild bibasilar atelectasis. There is no frank consolidation. Heart is mildly enlarged with pulmonary vascularity within normal limits. Patient is status post coronary artery bypass grafting. There is aortic atherosclerosis. No evident bone lesions.  IMPRESSION: Tube and catheter positions as described without pneumothorax. Rather minimal left pleural effusion with patchy bibasilar atelectasis. Stable cardiac prominence. There is aortic atherosclerosis.  Aortic Atherosclerosis (ICD10-I70.0).   Electronically Signed   By: Lowella Grip III M.D.   On: 04/02/2017 07:19   Assessment/Plan: S/P Procedure(s) (LRB): CORONARY ARTERY BYPASS GRAFTING (CABG) x Three, using left internal mammary artery and right leg greater saphenous vein harvested endoscopically (N/A) TRANSESOPHAGEAL ECHOCARDIOGRAM (TEE) (N/A)  Doing well POD3 Maintaining NSR w/ stable BP, somewhat hypertensive Breathing comfortably w/ O2 sats 97-100% on RA Expected post op atelectasis, mild Expected post op acute blood loss anemia, mild, stable Expected post  op volume excess, weight down, now close to baseline Hypokalemia, induced by loop diuretics   ASA + Plavix x6 months  Continue beta blocker  Restart ARB and continue Norvasc but hold Norvasc if BP <120  Statin  Mobilize  Decrease lasix  D/C tubes and lines  Transfer 4E   Rexene Alberts, MD 04/02/2017 8:15 AM

## 2017-04-02 NOTE — Plan of Care (Signed)
Progressing

## 2017-04-02 NOTE — Progress Notes (Signed)
  Amiodarone Drug - Drug Interaction Consult Note  Recommendations: N/A  Amiodarone is metabolized by the cytochrome P450 system and therefore has the potential to cause many drug interactions. Amiodarone has an average plasma half-life of 50 days (range 20 to 100 days).   There is potential for drug interactions to occur several weeks or months after stopping treatment and the onset of drug interactions may be slow after initiating amiodarone.   [x]  Statins: Increased risk of myopathy. Simvastatin- restrict dose to 20mg  daily. Other statins: counsel patients to report any muscle pain or weakness immediately.  []  Anticoagulants: Amiodarone can increase anticoagulant effect. Consider warfarin dose reduction. Patients should be monitored closely and the dose of anticoagulant altered accordingly, remembering that amiodarone levels take several weeks to stabilize.  []  Antiepileptics: Amiodarone can increase plasma concentration of phenytoin, the dose should be reduced. Note that small changes in phenytoin dose can result in large changes in levels. Monitor patient and counsel on signs of toxicity.  [x]  Beta blockers: increased risk of bradycardia, AV block and myocardial depression. Sotalol - avoid concomitant use.  []   Calcium channel blockers (diltiazem and verapamil): increased risk of bradycardia, AV block and myocardial depression.  []   Cyclosporine: Amiodarone increases levels of cyclosporine. Reduced dose of cyclosporine is recommended.  []  Digoxin dose should be halved when amiodarone is started.   [x]  Diuretics: increased risk of cardiotoxicity if hypokalemia occurs.  [x]  Oral hypoglycemic agents (glyburide, glipizide, glimepiride): increased risk of hypoglycemia. Patient's glucose levels should be monitored closely when initiating amiodarone therapy.   []  Drugs that prolong the QT interval:  Torsades de pointes risk may be increased with concurrent use - avoid if possible.  Monitor  QTc, also keep magnesium/potassium WNL if concurrent therapy can't be avoided. Marland Kitchen Antibiotics: e.g. fluoroquinolones, erythromycin. . Antiarrhythmics: e.g. quinidine, procainamide, disopyramide, sotalol. . Antipsychotics: e.g. phenothiazines, haloperidol.  . Lithium, tricyclic antidepressants, and methadone. Thank You,  Remigio Eisenmenger D. Mina Marble, PharmD, BCPS Pager:  [phone] 04/02/2017, 7:48 PM

## 2017-04-02 NOTE — Care Management Note (Signed)
Case Management Note  Patient Details  Name: Bryce Powell MRN: [id number] Date of Birth: 20-Apr-1941  Subjective/Objective:   From home with wife, who will be with him 24/7 to assist.  He does not use any assistive devices at home to ambulate with.  He has PCP and medication coverage.  POD 3 CABG, tubes dc/d, aline dc'd.                  Action/Plan: NCM will follow for dc needs.   Expected Discharge Date:  (unknown)               Expected Discharge Plan:  Home/Self Care  In-House Referral:     Discharge planning Services  CM Consult  Post Acute Care Choice:    Choice offered to:     DME Arranged:    DME Agency:     HH Arranged:    HH Agency:     Status of Service:  In process, will continue to follow  If discussed at Long Length of Stay Meetings, dates discussed:    Additional Comments:  Zenon Mayo, RN 04/02/2017, 11:31 AM

## 2017-04-02 NOTE — Progress Notes (Signed)
[facility] BRIEF SICU PROGRESS NOTE  3 Days Post-Op  S/P Procedure(s) (LRB): CORONARY ARTERY BYPASS GRAFTING (CABG) x Three, using left internal mammary artery and right leg greater saphenous vein harvested endoscopically (N/A) TRANSESOPHAGEAL ECHOCARDIOGRAM (TEE) (N/A)   Stable day although has gone into Afib w/ HR 90-100 and stable BP Breathing comfortably w/ O2 sats 100% on RA  Plan: Will start amiodarone  Rexene Alberts, MD 04/02/2017 7:42 PM

## 2017-04-03 ENCOUNTER — Inpatient Hospital Stay (HOSPITAL_COMMUNITY): Payer: Medicare Other

## 2017-04-03 LAB — CBC
HCT: 32.2 % — ABNORMAL LOW (ref 39.0–52.0)
Hemoglobin: 10.5 g/dL — ABNORMAL LOW (ref 13.0–17.0)
MCH: 28.5 pg (ref 26.0–34.0)
MCHC: 32.6 g/dL (ref 30.0–36.0)
MCV: 87.5 fL (ref 78.0–100.0)
Platelets: 215 10*3/uL (ref 150–400)
RBC: 3.68 MIL/uL — ABNORMAL LOW (ref 4.22–5.81)
RDW: 15.2 % (ref 11.5–15.5)
WBC: 9 10*3/uL (ref 4.0–10.5)

## 2017-04-03 LAB — GLUCOSE, CAPILLARY
Glucose-Capillary: 188 mg/dL — ABNORMAL HIGH (ref 65–99)
Glucose-Capillary: 124 mg/dL — ABNORMAL HIGH (ref 65–99)
Glucose-Capillary: 154 mg/dL — ABNORMAL HIGH (ref 65–99)

## 2017-04-03 LAB — BASIC METABOLIC PANEL
Anion gap: 7 (ref 5–15)
BUN: 12 mg/dL (ref 6–20)
CO2: 26 mmol/L (ref 22–32)
Calcium: 8.6 mg/dL — ABNORMAL LOW (ref 8.9–10.3)
Chloride: 101 mmol/L (ref 101–111)
Creatinine, Ser: 0.86 mg/dL (ref 0.61–1.24)
GFR calc Af Amer: >60 mL/min (ref >60)
GFR calc non Af Amer: >60 mL/min (ref >60)
Glucose, Bld: 185 mg/dL — ABNORMAL HIGH (ref 65–99)
Potassium: 4.2 mmol/L (ref 3.5–5.1)
Sodium: 134 mmol/L — ABNORMAL LOW (ref 135–145)

## 2017-04-03 NOTE — Progress Notes (Signed)
      [address]       [place] [address]             [phone]        CARDIOTHORACIC SURGERY PROGRESS NOTE   R4 Days Post-Op Procedure(s) (LRB): CORONARY ARTERY BYPASS GRAFTING (CABG) x Three, using left internal mammary artery and right leg greater saphenous vein harvested endoscopically (N/A) TRANSESOPHAGEAL ECHOCARDIOGRAM (TEE) (N/A)  Subjective: Feels good.  Wants to go home  Objective: Vital signs: BP Readings from Last 1 Encounters:  04/03/17 127/73   Pulse Readings from Last 1 Encounters:  04/03/17 91   Resp Readings from Last 1 Encounters:  04/03/17 (!) 23   Temp Readings from Last 1 Encounters:  04/03/17 98.8 F (37.1 C) (Oral)    Hemodynamics:    Physical Exam:  Rhythm:   sinus  Breath sounds: clear  Heart sounds:  RRR  Incisions:  Clean and dry  Abdomen:  Soft, non-distended, non-tender  Extremities:  Warm, well-perfused    Intake/Output from previous day: 11/19 0701 - 11/20 0700 In: 700.3 [P.O.:240; I.V.:460.3] Out: 1360 [Urine:1350; Chest Tube:10] Intake/Output this shift: No intake/output data recorded.  Lab Results:  CBC: Recent Labs    04/02/17 0300 04/03/17 0228  WBC 11.1* 9.0  HGB 10.2* 10.5*  HCT 31.0* 32.2*  PLT 166 215    BMET:  Recent Labs    04/02/17 0300 04/03/17 0228  NA 135 134*  K 3.5 4.2  CL 102 101  CO2 25 26  GLUCOSE 145* 185*  BUN 10 12  CREATININE 0.82 0.86  CALCIUM 8.3* 8.6*     PT/INR:  No results for input(s): LABPROT, INR in the last 72 hours.  CBG (last 3)  Recent Labs    04/02/17 1644 04/02/17 2121 04/03/17 0756  GLUCAP 160* 189* 188*    ABG    Component Value Date/Time   PHART 7.402 03/31/2017 0407   PCO2ART 37.9 03/31/2017 0407   PO2ART 76.0 (L) 03/31/2017 0407   HCO3 23.7 03/31/2017 0407   TCO2 24 04/01/2017 1613   ACIDBASEDEF 1.0 03/31/2017 0407   O2SAT 95.0 03/31/2017 0407    CXR: n/a  Assessment/Plan: S/P Procedure(s) (LRB): CORONARY ARTERY  BYPASS GRAFTING (CABG) x Three, using left internal mammary artery and right leg greater saphenous vein harvested endoscopically (N/A) TRANSESOPHAGEAL ECHOCARDIOGRAM (TEE) (N/A)  Doing well POD4 Post-op Afib - back in NSR on amiodarone Breathing comfortably on room air Expected post op acute blood loss anemia, mild, stable Expected post op atelectasis, mild Expected post op volume excess, weight reportedly now below preop baseline Essential hypertension, well controlled Type II diabetes mellitus, adequate glycemic control   Convert amiodarone to oral  Still waiting for bed on 4E for transfer  Anticipate possible d/c home 1-2 days  Rexene Alberts, MD 04/03/2017 8:17 AM

## 2017-04-03 NOTE — Progress Notes (Signed)
Removed one epicardial wire per order. 3 intact.  Pt tolerated procedure well. Attempted twice to remove right atrial/ventricle wires and met resistance. RN Helene Kelp made aware. Pt instructed to remain on bedrest for one hour.  Frequent vitals will be taken and documented. Pt resting with call bell within reach. Payton Emerald, RN

## 2017-04-03 NOTE — Progress Notes (Signed)
EPW d/c'd per order and per protocol. Tips intact. VSS. Pt educated on need for q73m vitals and bedrest x1h. Call bell and phone within reach. Will continue to monitor.

## 2017-04-03 NOTE — Progress Notes (Signed)
CARDIAC REHAB PHASE I   PRE:  Rate/Rhythm: 79 SR  BP:  Supine: 126/53  Sitting:   Standing:    SaO2: 93%RA  MODE:  Ambulation: 470 ft   POST:  Rate/Rhythm: 93 SR  BP:  Supine: 140/60  Sitting:   Standing:    SaO2: 97%RA 1325-1352 Pt walked 470 ft on RA with rolling walker with steady gait. Pt does not think he will need walker for home use. Tolerated well. Back to bed for pacing wire removal.   Graylon Good, RN BSN  04/03/2017 1:47 PM

## 2017-04-04 LAB — GLUCOSE, CAPILLARY: Glucose-Capillary: 165 mg/dL — ABNORMAL HIGH (ref 65–99)

## 2017-04-04 MED ORDER — METOPROLOL TARTRATE 25 MG PO TABS
25.0000 mg | ORAL_TABLET | Freq: Two times a day (BID) | ORAL | 1 refills | Status: DC
Start: 2017-04-04 — End: 2017-11-12

## 2017-04-04 MED ORDER — GUAIFENESIN ER 600 MG PO TB12: 600.0000 mg | ORAL_TABLET | Freq: Two times a day (BID) | ORAL | 0 refills | Status: DC

## 2017-04-04 MED ORDER — OXYCODONE HCL 5 MG PO TABS: 5.0000 mg | ORAL_TABLET | ORAL | 0 refills | Status: DC | PRN

## 2017-04-04 MED ORDER — ACETAMINOPHEN 500 MG PO TABS: 1000.0000 mg | ORAL_TABLET | Freq: Four times a day (QID) | ORAL | 0 refills | Status: AC

## 2017-04-04 MED ORDER — AMIODARONE HCL 200 MG PO TABS: 200.0000 mg | ORAL_TABLET | Freq: Two times a day (BID) | ORAL | Status: DC

## 2017-04-04 MED ORDER — ATORVASTATIN CALCIUM 80 MG PO TABS: 80.0000 mg | ORAL_TABLET | Freq: Every day | ORAL | 1 refills | Status: DC

## 2017-04-04 MED ORDER — AMIODARONE HCL 200 MG PO TABS: 200.0000 mg | ORAL_TABLET | Freq: Two times a day (BID) | ORAL | 1 refills | Status: DC

## 2017-04-04 MED ORDER — CLOPIDOGREL BISULFATE 75 MG PO TABS: 75.0000 mg | ORAL_TABLET | Freq: Every day | ORAL | 1 refills | Status: DC

## 2017-04-04 NOTE — Progress Notes (Signed)
[id number] Education completed with pt and family who voiced understanding. Reviewed sternal precautions, IS, heart healthy and low carb diet, ex ed and CRP 2. Pt stated will ex on his own and did not want referral for CRP 2. Daughter has lost 200 lbs and stated she will help father with diet and ex. Pt did not want to view discharge video. Discussed smoking cessation and offered fake cigarette. Pt did not want fake cigarette and stated he plans to quit cold [place]. Graylon Good RN BSN 04/04/2017 10:21 AM

## 2017-04-04 NOTE — Care Management Important Message (Signed)
Important Message  Patient Details  Name: Bryce Powell MRN: [id number] Date of Birth: 03/07/1941   Medicare Important Message Given:  Yes    Nathen May 04/04/2017, 10:23 AM

## 2017-04-04 NOTE — Progress Notes (Addendum)
      [address]       [place] [address]             [phone]      5 Days Post-Op Procedure(s) (LRB): CORONARY ARTERY BYPASS GRAFTING (CABG) x Three, using left internal mammary artery and right leg greater saphenous vein harvested endoscopically (N/A) TRANSESOPHAGEAL ECHOCARDIOGRAM (TEE) (N/A) Subjective: Feels great this morning. Wants to go home.   Objective: Vital signs in last 24 hours: Temp:  [97.6 F (36.4 C)-98.5 F (36.9 C)] 98.3 F (36.8 C) (11/21 0512) Pulse Rate:  [75-82] 75 (11/21 0512) Cardiac Rhythm: Normal sinus rhythm (11/20 1945) Resp:  [14-22] 14 (11/21 0512) BP: (110-143)/(49-64) 134/58 (11/21 0820) SpO2:  [95 %-97 %] 97 % (11/21 0512) Weight:  [213 lb 9.6 oz (96.9 kg)] 213 lb 9.6 oz (96.9 kg) (11/21 0512)    Intake/Output from previous day: No intake/output data recorded. Intake/Output this shift: No intake/output data recorded.  General appearance: alert, cooperative and no distress Heart: regular rate and rhythm, S1, S2 normal, no murmur, click, rub or gallop Lungs: clear to auscultation bilaterally Abdomen: soft, non-tender; bowel sounds normal; no masses,  no organomegaly Extremities: extremities normal, atraumatic, no cyanosis or edema Wound: clean and dry. EVH site healing well   Lab Results: Recent Labs    04/02/17 0300 04/03/17 0228  WBC 11.1* 9.0  HGB 10.2* 10.5*  HCT 31.0* 32.2*  PLT 166 215   BMET:  Recent Labs    04/02/17 0300 04/03/17 0228  NA 135 134*  K 3.5 4.2  CL 102 101  CO2 25 26  GLUCOSE 145* 185*  BUN 10 12  CREATININE 0.82 0.86  CALCIUM 8.3* 8.6*    PT/INR: No results for input(s): LABPROT, INR in the last 72 hours. ABG    Component Value Date/Time   PHART 7.402 03/31/2017 0407   HCO3 23.7 03/31/2017 0407   TCO2 24 04/01/2017 1613   ACIDBASEDEF 1.0 03/31/2017 0407   O2SAT 95.0 03/31/2017 0407   CBG (last 3)  Recent Labs    04/03/17 1224 04/03/17 2130 04/04/17 0643  GLUCAP  124* 154* 165*    Assessment/Plan: S/P Procedure(s) (LRB): CORONARY ARTERY BYPASS GRAFTING (CABG) x Three, using left internal mammary artery and right leg greater saphenous vein harvested endoscopically (N/A) TRANSESOPHAGEAL ECHOCARDIOGRAM (TEE) (N/A)  1. CV-post-op Afib, now NSR in the 70s with occasional PVC. BP well controlled. Taking Lopressor, Cozaar, Plavix, Norvasc, Amio, and ASA 2. Pulm-tolerating room air with good oxygen saturation. CXR yesterday stable. 3. Renal-creatinine 0.86, electrolytes stable. Making good urine, weight continues to trend down.  4. H and H stable, expected acute blood loss anemia 5.  Blood glucose level with moderate control.   Plan: home today.    LOS: 7 days    Bryce Powell 04/04/2017

## 2017-04-04 NOTE — Care Management Note (Signed)
Case Management Note Previous CM note completed by Zenon Mayo, RN--04/02/2017, 11:31 AM   Patient Details  Name: Bryce Powell MRN: [id number] Date of Birth: [birth year]  Subjective/Objective:   From home with wife, who will be with him 24/7 to assist.  He does not use any assistive devices at home to ambulate with.  He has PCP and medication coverage.  POD 3 CABG, tubes dc/d, aline dc'd.                  Action/Plan: NCM will follow for dc needs.   Expected Discharge Date:  04/04/17               Expected Discharge Plan:  Home/Self Care  In-House Referral:  NA  Discharge planning Services  CM Consult  Post Acute Care Choice:  NA Choice offered to:  NA  DME Arranged:  N/A DME Agency:  NA  HH Arranged:  NA HH Agency:  NA  Status of Service:  Completed, signed off  If discussed at [place] of Stay Meetings, dates discussed:    Discharge Disposition: home/self care   Additional Comments:  04/04/17- 0950- Marvetta Gibbons RN, CM- pt for d/c home today- no CM needs noted for discharge.   Dawayne Patricia, RN 04/04/2017, 9:51 AM [phone]

## 2017-04-04 NOTE — Discharge Summary (Signed)
Physician Discharge Summary  Patient ID: Bryce Powell MRN: [id number] DOB/AGE: 1940/08/24 76 y.o.  Admit date: 03/28/2017 Discharge date: 04/04/2017  Admission Diagnoses: Patient Active Problem List   Diagnosis Date Noted  . Tobacco abuse   . NSTEMI (non-ST elevated myocardial infarction) ([place]) 03/28/2017  . HTN (hypertension) 03/28/2017  . Diabetes 1.5, managed as type 1 ([place]) 03/28/2017  . Angina at rest [facility]) 03/28/2017    Discharge Diagnoses:  Principal Problem:   S/P CABG x 3 Active Problems:   NSTEMI (non-ST elevated myocardial infarction) ([place])   Angina at rest [facility])   Tobacco abuse   Discharged Condition: good  HPI:  Bryce Powell is a 76 year old male patient with past medical history of hypertension, tobacco dependence (smoker for 60 years at 2 packs a day), diabetes mellitus type 1, and dyslipidemia who presented to the emergency department with severe chest pain which radiated to his neck. He has had shortness of breath for the last year but when he was checked out by a physician they told him he was heathy.  He has had no palpitations associated with pain.  He was splitting wood yesterday when the chest tightness occurred. An EKG in the ED was obtained which showed slight ST depression in the lateral leads.  A cardiac catheterization was done today which showed multivessel coronary artery disease and a left ventricular ejection fraction of 50-55%.  The patient is retired but works on cars for fun. He was in the backyard splitting wood when the chest pain began. He does seem pretty active although his shortness of breath has slowed him down over the past year.    Hospital Course:  On 03/30/2017 Bryce Powell underwent a coronary artery bypass grafting x3 with Dr. Roxy Manns.  He tolerated the procedure well was transferred to the ICU.  He was extubated in a timely manner.  Postop day 1 we initiated a diuretic regimen for fluid overload.  We discontinued lines.  We began to  mobilize the patient.  Postop day 2 we were able to increase his Lopressor for better heart rate and blood pressure control.  We continue to work on diabetic control.  He was maintaining normal sinus rhythm.  Postop day 3 he was breathing comfortably on room air with good oxygen saturation.  He did have some expected postop atelectasis which was improving.  He also had some expected postop blood loss anemia which was stable.  We continue to diurese the patient for fluid overload.  We initiated Plavix and aspirin 81 mg for 6 months.  We discontinued his chest tubes at this time.  He was stable to transfer to the telemetry unit.  Later that day the patient did go into atrial fibrillation with a heart rate of 90-100bpm.  We initiated amiodarone.  Postop day 4 he went back into normal sinus rhythm on p.o. amiodarone.  He continued to diurese appropriately and his weight continued to trend down below baseline.  He continued to breathe comfortably on room air.  He continued to ambulate with limited assistance.  He was transferred to 4E once a bed was available.  Postop day 5 he continued to improve.  His blood pressure was better controlled on Lopressor, Cozaar, and Norvasc.  His renal function remained stable.  He remained off of room air and ambulating with limited assistance.  He was deemed appropriate for discharge.  Consults: None  Significant Diagnostic Studies: CLINICAL DATA:  Chest pain.  EXAM: CHEST  2 VIEW  COMPARISON:  Radiograph  of April 02, 2017.  FINDINGS: Stable cardiomegaly. Atherosclerosis of thoracic aorta is noted. Status post coronary bypass graft. No pneumothorax is noted. Minimal bilateral pleural effusions may be present. Mild bibasilar subsegmental atelectasis is noted. Bony thorax is unremarkable.  IMPRESSION: Mild bibasilar subsegmental atelectasis.  Aortic atherosclerosis.   Electronically Signed   By: Marijo Conception, M.D.   On: 04/03/2017 09:39  Treatments:    Date of Procedure:    03/30/2017  Preoperative Diagnosis:        Severe Left Main and 3-vessel Coronary Artery Disease  S/P Acute Non-STEMI  Postoperative Diagnosis:    Same  Procedure:        Coronary Artery Bypass Grafting x 3              Left Internal Mammary Artery to Distal Left Anterior Descending Coronary Artery             Saphenous Vein Graft to 2nd Left Posterolateral Branch Coronary Artery             Saphenous Vein Graft to First Obtuse Marginal Branch of Left Circumflex Coronary Artery             Endoscopic Vein Harvest from Right Thigh and Lower Leg  Surgeon:        Valentina Gu. Roxy Manns, MD  Assistant:       Nicholes Rough, PA-C  Anesthesia:    Roberts Gaudy, MD  Operative Findings: ? Mild left ventricular systolic dysfunction, EF 59% ? Good quality left internal mammary artery conduit ? Good quality saphenous vein conduit ? Diffuse CAD with only fair quality target vessels for grafting     Discharge Exam: Blood pressure (!) 134/58, pulse 75, temperature 98.3 F (36.8 C), temperature source Oral, resp. rate 14, height 5\' 11"  (1.803 m), weight 213 lb 9.6 oz (96.9 kg), SpO2 97 %.   General appearance: alert, cooperative and no distress Heart: regular rate and rhythm, S1, S2 normal, no murmur, click, rub or gallop Lungs: clear to auscultation bilaterally Abdomen: soft, non-tender; bowel sounds normal; no masses,  no organomegaly Extremities: extremities normal, atraumatic, no cyanosis or edema Wound: clean and dry. EVH site healing well     Disposition:   Discharge Instructions    AMB Referral to Cardiac Rehabilitation - Phase II   Complete by:  As directed    Diagnosis:  NSTEMI     Allergies as of 04/04/2017   No Known Allergies     Medication List    TAKE these medications   acetaminophen 500 MG tablet Commonly known as:  TYLENOL Take 2 tablets (1,000 mg total) by mouth every 6 (six) hours.   amiodarone 200 MG tablet Commonly  known as:  PACERONE Take 1 tablet (200 mg total) by mouth 2 (two) times daily after a meal.   amLODipine 5 MG tablet Commonly known as:  NORVASC Take 5 mg daily by mouth.   aspirin EC 81 MG tablet Take 81 mg daily by mouth.   atorvastatin 80 MG tablet Commonly known as:  LIPITOR Take 1 tablet (80 mg total) by mouth daily at 6 PM.   clopidogrel 75 MG tablet Commonly known as:  PLAVIX Take 1 tablet (75 mg total) by mouth daily. Start taking on:  04/05/2017   glipiZIDE 5 MG tablet Commonly known as:  GLUCOTROL Take 5 mg daily by mouth.   guaiFENesin 600 MG 12 hr tablet Commonly known as:  MUCINEX Take 1 tablet (600 mg total) by mouth 2 (  two) times daily.   losartan 50 MG tablet Commonly known as:  COZAAR Take 50 mg daily by mouth.   metFORMIN 1000 MG tablet Commonly known as:  GLUCOPHAGE Take 1,000 mg 2 (two) times daily by mouth.   metoprolol tartrate 25 MG tablet Commonly known as:  LOPRESSOR Take 1 tablet (25 mg total) by mouth 2 (two) times daily.   oxyCODONE 5 MG immediate release tablet Commonly known as:  Oxy IR/ROXICODONE Take 1 tablet (5 mg total) by mouth every 4 (four) hours as needed for severe pain.      Follow-up Information    Mazzocchi, Reggy Eye, MD. Call in 1 day(s).   Specialty:  Family Medicine Contact information: [address] [place] [place] [address] [phone]        Rexene Alberts, MD Follow up.   Specialty:  Cardiothoracic Surgery Why:  Your appointment is on April 30, 2017 at 2:00 PM.  Please arrive at 1:30 PM for chest x-ray located at [facility] which is on the first floor of our building. Contact information: [address] [place] [address] [phone]        Isaiah Serge, NP. Go on 04/27/2017.   Specialties:  Cardiology, Radiology Why:  @8am  for hospital follow up Contact information: [place] [address] [phone]          The patient  has been discharged on:   1.Beta Blocker:  Yes [ x  ]                              No   [   ]                              If No, reason:  2.Ace Inhibitor/ARB: Yes [ x  ]                                     No  [    ]                                     If No, reason:  3.Statin:   Yes [ x  ]                  No  [   ]                  If No, reason:  4.Ecasa:  Yes  [ x  ]                  No   [   ]                  If No, reason:    Signed: Elgie Collard 04/04/2017, 2:27 PM

## 2017-04-24 NOTE — Progress Notes (Signed)
Cardiology Office Note   Date:  04/27/2017   ID:  Bryce Powell, DOB 09-25-1940, MRN [id number]  PCP:  Candi Leash, PA-C  Cardiologist:  Dr. Tamala Julian    Chief Complaint  Patient presents with  . Hospitalization Follow-up      History of Present Illness: Bryce Powell is a 76 y.o. male who presents for post hospital with admit for ACS/NSTEMI with pk troponin 1.96 and proceeded to CABG for severe 3 vessel disease including 90% LM . NSTEMI,  EF 50-55%.  Underwent CABG with Dr. Roxy Manns 03/30/17 with LIMA to dLAD, VG to 2nd PLA and VG to 1st OM of LCX.    Pt did well and was discharged 04/04/17.    He did have a fib with HR 90-100 and amiodarone started.  He did convert back to SR.    He has a past medical history of hypertension, tobacco dependence(smoker for 60 years at 2 packs a day),diabetes mellitus type 1,and dyslipidemia who presented to the emergency department with severe chest pain which radiated to his neck. He has had shortness of breath for the last year and previously when he was checked out by a physician they told him he was heathy.  Today he has no complaints.  He is walking in parking lots that have been cleared.  His appetite is good.  No palpitations for rapid HR, no lightheadedness.  No fevers.  He feels good.  He does not plan to go to Cardiac Rehab.  He has plenty to do at home, and wants to get started. No chest pain no SOB.  Plans to stop smoking  Today he has new RBBB on EKG on amiodarone and BB.  Follow up with Dr. Roxy Manns in 04/30/17.  Past Medical History:  Diagnosis Date  . Current smoker   . Diabetes ([place])   . HTN (hypertension)   . PAF (paroxysmal atrial fibrillation) ([place])    post CABG  . 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 Tobacco abuse     Past Surgical History:  Procedure Laterality Date  . CORONARY ARTERY BYPASS GRAFT N/A 03/30/2017   Procedure: CORONARY  ARTERY BYPASS GRAFTING (CABG) x Three, using left internal mammary artery and right leg greater saphenous vein harvested endoscopically;  Surgeon: Rexene [place]ts, MD;  Location: [facility];  Service: Open Heart Surgery;  Laterality: N/A;  . LEFT HEART CATH AND CORONARY ANGIOGRAPHY N/A 03/29/2017   Procedure: LEFT HEART CATH AND CORONARY ANGIOGRAPHY;  Surgeon: Lorretta Harp, MD;  Location: [facility];  Service: Cardiovascular;  Laterality: N/A;  . TEE WITHOUT CARDIOVERSION N/A 03/30/2017   Procedure: TRANSESOPHAGEAL ECHOCARDIOGRAM (TEE);  Surgeon: Rexene Alberts, MD;  Location: [facility];  Service: Open Heart Surgery;  Laterality: N/A;     Current Outpatient Medications  Medication Sig Dispense Refill  . acetaminophen (TYLENOL) 500 MG tablet Take 2 tablets (1,000 mg total) by mouth every 6 (six) hours. 30 tablet 0  . amLODipine (NORVASC) 5 MG tablet Take 5 mg daily by mouth.    Marland Kitchen aspirin EC 81 MG tablet Take 81 mg daily by mouth.    Marland Kitchen atorvastatin (LIPITOR) 80 MG tablet Take 1 tablet (80 mg total) by mouth daily at 6 PM. 30 tablet 1  . clopidogrel (PLAVIX) 75 MG tablet Take 1 tablet (75 mg total) by mouth daily. 30 tablet  1  . glipiZIDE (GLUCOTROL) 5 MG tablet Take 5 mg daily by mouth.    Marland Kitchen guaiFENesin (MUCINEX) 600 MG 12 hr tablet Take 1 tablet (600 mg total) by mouth 2 (two) times daily. 14 tablet 0  . losartan (COZAAR) 50 MG tablet Take 50 mg daily by mouth.    . metFORMIN (GLUCOPHAGE) 1000 MG tablet Take 1,000 mg 2 (two) times daily by mouth.    . metoprolol tartrate (LOPRESSOR) 25 MG tablet Take 1 tablet (25 mg total) by mouth 2 (two) times daily. 60 tablet 1  . oxyCODONE (OXY IR/ROXICODONE) 5 MG immediate release tablet Take 1 tablet (5 mg total) by mouth every 4 (four) hours as needed for severe pain. 30 tablet 0  . amiodarone (PACERONE) 200 MG tablet Take 1 tablet (200 mg total) by mouth daily. 90 tablet 3   No current facility-administered medications for this visit.      Allergies:   Patient has no known allergies.    Social History:  The patient  reports that he has been smoking cigars.  He has a 120.00 pack-year smoking history. he has never used smokeless tobacco. He reports that he does not drink alcohol or use drugs.   Family History:  The patient's family history includes Diabetes in his mother; Heart attack in his father and mother.    ROS:  General:no colds or fevers, + weight loss Skin:no rashes or ulcers HEENT:no blurred vision, no congestion CV:see HPI PUL:see HPI GI:no diarrhea constipation or melena, no indigestion GU:no hematuria, no dysuria MS:no joint pain, no claudication Neuro:no syncope, no lightheadedness Endo:no diabetes, no thyroid disease  Wt Readings from Last 3 Encounters:  04/27/17 208 lb (94.3 kg)  04/04/17 213 lb 9.6 oz (96.9 kg)     PHYSICAL EXAM: VS:  BP 120/60   Pulse 80   Ht 5\' 11"  (1.803 m)   Wt 208 lb (94.3 kg)   SpO2 94%   BMI 29.01 kg/m  , BMI Body mass index is 29.01 kg/m. General:Pleasant affect, NAD Skin:Warm and dry, brisk capillary refill HEENT:normocephalic, sclera clear, mucus membranes moist Neck:supple, no JVD, no bruits  Heart:S1S2 RRR without murmur, gallup, rub or click, chest wall incision healing without redness or drainage. Lungs:clear without rales, rhonchi, or wheezes VFI:EPPI, non tender, + BS, do not palpate liver spleen or masses Ext:no lower ext edema, 2+ pedal pulses, 2+ radial pulses Neuro:alert and oriented X 3, MAE, follows commands, + facial symmetry    EKG:  EKG is ordered today. The ekg ordered today demonstrates SR with RBBB and 1st degree AV block PR 218 ms.     Recent Labs: 03/28/2017: B Natriuretic Peptide 75.4 03/29/2017: ALT 16 03/31/2017: Magnesium 2.0 04/03/2017: BUN 12; Creatinine, Ser 0.86; Hemoglobin 10.5; Platelets 215; Potassium 4.2; Sodium 134    Lipid Panel    Component Value Date/Time   CHOL 177 03/30/2017 0308   TRIG 793 (H) 03/30/2017  0308   HDL 25 (L) 03/30/2017 0308   CHOLHDL 7.1 03/30/2017 0308   VLDL UNABLE TO CALCULATE IF TRIGLYCERIDE OVER 400 mg/dL 03/30/2017 0308   LDLCALC UNABLE TO CALCULATE IF TRIGLYCERIDE OVER 400 mg/dL 03/30/2017 0308       Other studies Reviewed: Additional studies/ records that were reviewed today include: . Cardiac cath 03/29/17 Procedures   LEFT HEART CATH AND CORONARY ANGIOGRAPHY  Conclusion     Ost LM to Mid LM lesion is 90% stenosed.  Ost Cx lesion is 90% stenosed.  Ost LAD lesion is 70%  stenosed.  Prox LAD lesion is 90% stenosed.  Prox Cx to Mid Cx lesion is 95% stenosed.  Prox RCA to Mid RCA lesion is 95% stenosed.  The left ventricular systolic function is normal.  LV end diastolic pressure is normal.  The left ventricular ejection fraction is 50-55% by visual estimate.     TEE 03/30/17 Conclusions   Result status: Final result   Aortic valve: The valve is trileaflet. Mild valve thickening present. No stenosis. No regurgitation.  Mitral valve: Mild leaflet thickening is present. Predominantly posterior mild mitral annular calcification. Trace regurgitation.  Right ventricle: Normal cavity size, wall thickness and ejection fraction. In the post-bypass study right ventricular systolic function appeared normal.  Tricuspid valve: Trace regurgitation. The tricuspid valve regurgitation jet is central.  Pulmonic valve: Trace regurgitation.     Cardiac surgery 03/30/17 PROCEDURE:  Procedure(s): CORONARY ARTERY BYPASS GRAFTING (CABG) x Three, using left internal mammary artery and right leg greater saphenous vein harvested endoscopically (N/A) TRANSESOPHAGEAL ECHOCARDIOGRAM (TEE) (N/A)  SVG to PL #2 SVG to OM #1 LIMA to LAD   ASSESSMENT AND PLAN:  1.  Post NSTEMI 03/29/17 post CABG for severe CAD   2.  CAD s/P CABG X 3 LIMA to LAD, VG to PL 2 and VG to Om 1.  Doing well, incision healing, no palpitations  Does not wish to go to cardiac rehab  3.   HLD on statin needs lipids in 6-8 weeks  4.  Tobacco use - he believes he will stop  5.  PAF post procedure on amiodarone and BB, no palpitations and SR on EKG   6.  New RBBB we decreased his amiodarone to once daily.  To see Dr. Roxy Manns on the 17th.  7.  Anemia post op from blood loss check CBC today  8.  Hypokalemia at 3.5 on discharge will recheck BMP    Current medicines are reviewed with the patient today.  The patient Has no concerns regarding medicines.  The following changes have been made:  See above Labs/ tests ordered today include:see above  Disposition:   FU:  see above  Signed, Cecilie Kicks, NP  04/27/2017 11:50 AM    [facility], [place], [place], [place] Phone: [phone]; Fax: [phone]

## 2017-04-25 ENCOUNTER — Encounter: Payer: Self-pay | Admitting: Cardiology

## 2017-04-27 ENCOUNTER — Encounter (INDEPENDENT_AMBULATORY_CARE_PROVIDER_SITE_OTHER): Payer: Self-pay

## 2017-04-27 ENCOUNTER — Other Ambulatory Visit: Payer: Self-pay | Admitting: Thoracic Surgery (Cardiothoracic Vascular Surgery)

## 2017-04-27 ENCOUNTER — Encounter: Payer: Self-pay | Admitting: Cardiology

## 2017-04-27 ENCOUNTER — Ambulatory Visit: Payer: Medicare Other | Admitting: Cardiology

## 2017-04-27 VITALS — BP 120/60 | HR 80 | Ht 71.0 in | Wt 208.0 lb

## 2017-04-27 DIAGNOSIS — I4510 Unspecified right bundle-branch block: Secondary | ICD-10-CM

## 2017-04-27 DIAGNOSIS — I214 Non-ST elevation (NSTEMI) myocardial infarction: Secondary | ICD-10-CM | POA: Diagnosis not present

## 2017-04-27 DIAGNOSIS — E782 Mixed hyperlipidemia: Secondary | ICD-10-CM

## 2017-04-27 DIAGNOSIS — I2510 Atherosclerotic heart disease of native coronary artery without angina pectoris: Secondary | ICD-10-CM | POA: Diagnosis not present

## 2017-04-27 DIAGNOSIS — E876 Hypokalemia: Secondary | ICD-10-CM | POA: Diagnosis not present

## 2017-04-27 DIAGNOSIS — Z951 Presence of aortocoronary bypass graft: Secondary | ICD-10-CM | POA: Diagnosis not present

## 2017-04-27 DIAGNOSIS — D649 Anemia, unspecified: Secondary | ICD-10-CM | POA: Diagnosis not present

## 2017-04-27 DIAGNOSIS — Z720 Tobacco use: Secondary | ICD-10-CM | POA: Diagnosis not present

## 2017-04-27 HISTORY — DX: Unspecified right bundle-branch block: I45.10

## 2017-04-27 MED ORDER — AMIODARONE HCL 200 MG PO TABS: 200.0000 mg | ORAL_TABLET | Freq: Every day | ORAL | 3 refills | Status: DC

## 2017-04-27 NOTE — Patient Instructions (Addendum)
Medication Instructions:  Your physician has recommended you make the following change in your medication: 1.  REDUCE the Amiodarone to 1 tablet daily   Labwork: TODAY:  BMET & CBC  Testing/Procedures: None ordered  Follow-Up: Your physician recommends that you schedule a follow-up appointment in: [place]   Any Other Special Instructions Will Be Listed Below (If Applicable).     If you need a refill on your cardiac medications before your next appointment, please call your pharmacy.

## 2017-04-28 LAB — CBC
Hematocrit: 34.2 % — ABNORMAL LOW (ref 37.5–51.0)
Hemoglobin: 11.4 g/dL — ABNORMAL LOW (ref 13.0–17.7)
MCH: 28.6 pg (ref 26.6–33.0)
MCHC: 33.3 g/dL (ref 31.5–35.7)
MCV: 86 fL (ref 79–97)
Platelets: 418 10*3/uL — ABNORMAL HIGH (ref 150–379)
RBC: 3.99 x10E6/uL — ABNORMAL LOW (ref 4.14–5.80)
RDW: 14.4 % (ref 12.3–15.4)
WBC: 8.4 10*3/uL (ref 3.4–10.8)

## 2017-04-28 LAB — BASIC METABOLIC PANEL
BUN/Creatinine Ratio: 21 (ref 10–24)
BUN: 24 mg/dL (ref 8–27)
CO2: 26 mmol/L (ref 20–29)
Calcium: 10.2 mg/dL (ref 8.6–10.2)
Chloride: 96 mmol/L (ref 96–106)
Creatinine, Ser: 1.14 mg/dL (ref 0.76–1.27)
GFR calc Af Amer: 72 mL/min/{1.73_m2} (ref >59)
GFR calc non Af Amer: 63 mL/min/{1.73_m2} (ref >59)
Glucose: 224 mg/dL — ABNORMAL HIGH (ref 65–99)
Potassium: 4.2 mmol/L (ref 3.5–5.2)
Sodium: 139 mmol/L (ref 134–144)

## 2017-04-30 ENCOUNTER — Ambulatory Visit (INDEPENDENT_AMBULATORY_CARE_PROVIDER_SITE_OTHER): Payer: Self-pay | Admitting: Surgical

## 2017-04-30 ENCOUNTER — Other Ambulatory Visit: Payer: Self-pay

## 2017-04-30 ENCOUNTER — Ambulatory Visit
Admission: RE | Admit: 2017-04-30 | Discharge: 2017-04-30 | Disposition: A | Discharge status: Home / Self Care | Payer: Medicare Other | Source: Ambulatory Visit | Attending: Thoracic Surgery (Cardiothoracic Vascular Surgery) | Admitting: Thoracic Surgery (Cardiothoracic Vascular Surgery)

## 2017-04-30 ENCOUNTER — Ambulatory Visit: Payer: Medicare Other | Admitting: Cardiology

## 2017-04-30 VITALS — BP 109/56 | HR 63 | Resp 18 | Ht 71.0 in | Wt 209.0 lb

## 2017-04-30 DIAGNOSIS — Z951 Presence of aortocoronary bypass graft: Secondary | ICD-10-CM

## 2017-04-30 NOTE — Patient Instructions (Signed)
Activity progression and driving protocols.

## 2017-04-30 NOTE — Progress Notes (Signed)
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 Roxy Manns, MD  Assistant:       Nicholes Rough, PA-C  Anesthesia:    Roberts Gaudy, MD  Operative Findings: ? Mild left ventricular systolic dysfunction, EF 29% ? Good quality left internal mammary artery conduit ? Good quality saphenous vein conduit ? Diffuse CAD with only fair quality target vessels for grafting             History of Present Illness:    Patient is a 76 year old male status post the above procedures seen in the office on today's routine post surgical follow-up.  He reports that he is doing well.  He denies chest pain or shortness of breath.  He is no longer taking any pain medication.  He denies fevers, chills or other constitutional symptoms.  He is increasing his ambulation regularly without difficulty.  He has had no difficulties with his incisions.  He has not smoked since the hospitalization.      Past Medical History:  Diagnosis Date  . Current smoker   . Diabetes  ([place])   . HTN (hypertension)   . PAF (paroxysmal atrial fibrillation) ([place])    post CABG  . 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 Tobacco abuse      Social History   Tobacco Use  Smoking Status Former Smoker  . Packs/day: 2.00  . Years: 60.00  . Pack years: 120.00  . Types: Cigars  . Last attempt to quit: 03/28/2017  . Years since quitting: 0.0  Smokeless Tobacco Never Used  Tobacco Comment   Pt states , "I will probably quit now."    Social History   Substance and Sexual Activity  Alcohol Use No  . Frequency: Never     No Known Allergies  Current Outpatient Medications  Medication Sig Dispense Refill  . acetaminophen (TYLENOL) 500 MG tablet Take 2 tablets (1,000 mg total) by mouth every 6 (six) hours. 30 tablet 0  . amiodarone (  PACERONE) 200 MG tablet Take 1 tablet (200 mg total) by mouth daily. 90 tablet 3  . amLODipine (NORVASC) 5 MG tablet Take 5 mg daily by mouth.    Marland Kitchen aspirin EC 81 MG tablet Take 81 mg daily by mouth.    Marland Kitchen atorvastatin (LIPITOR) 80 MG tablet Take 1 tablet (80 mg total) by mouth daily at 6 PM. 30 tablet 1  . clopidogrel (PLAVIX) 75 MG tablet Take 1 tablet (75 mg total) by mouth daily. 30 tablet 1  . glipiZIDE (GLUCOTROL) 5 MG tablet Take 5 mg daily by mouth.    Marland Kitchen guaiFENesin (MUCINEX) 600 MG 12 hr tablet Take 1 tablet (600 mg total) by mouth 2 (two) times daily. 14 tablet 0  . losartan (COZAAR) 50 MG tablet Take 50 mg daily by mouth.    . metFORMIN (GLUCOPHAGE) 1000 MG tablet Take 1,000 mg 2 (two) times daily by mouth.    . metoprolol tartrate (LOPRESSOR) 25 MG tablet Take 1 tablet (25 mg total) by mouth 2 (two) times daily. 60 tablet 1  . oxyCODONE (OXY IR/ROXICODONE) 5 MG immediate release tablet Take 1 tablet (5 mg total) by mouth every 4 (four) hours as needed for severe pain. 30 tablet 0   No current facility-administered medications for this visit.         Physical Exam: BP (!) 109/56 (BP Location: Right Arm, Patient Position: Sitting, Cuff Size: Large)   Pulse 63   Resp 18   Ht 5\' 11"  (1.803 m)   Wt 209 lb (94.8 kg)   SpO2 98% Comment: on RA  BMI 29.15 kg/m   General appearance: alert, cooperative and no distress Heart: regular rate and rhythm Lungs: clear to auscultation bilaterally Abdomen: soft, non-tender; bowel sounds normal; no masses,  no organomegaly Extremities: extremities normal, atraumatic, no cyanosis or edema Wound: The incisions are healing well without evidence of infection   Diagnostic Studies & Laboratory data:     Recent Radiology Findings:   Dg Chest 2 View  Result Date: 04/30/2017 CLINICAL DATA:  Status post CABG on March 30, 2017. No current complaints. EXAM: CHEST  2 VIEW COMPARISON:  Chest x-ray of April 03, 2017 FINDINGS: The lungs are well-expanded and clear. The heart and pulmonary vascularity are normal. The sternal wires are intact. There is calcification in the wall of the tortuous descending thoracic aorta. There is no pleural effusion or pneumothorax. Bibasilar densities previously described have resolved. IMPRESSION: Chronic bronchitic changes, stable. Stable postsurgical changes of CABG. No CHF nor pneumonia. Thoracic aortic atherosclerosis. Electronically Signed   By: Bazil  Martinique M.D.   On: 04/30/2017 13:13      Recent Lab Findings: Lab Results  Component Value Date   WBC 8.4 04/27/2017   HGB 11.4 (L) 04/27/2017   HCT 34.2 (L) 04/27/2017   PLT 418 (H) 04/27/2017   GLUCOSE 224 (H) 04/27/2017   CHOL 177 03/30/2017   TRIG 793 (H) 03/30/2017   HDL 25 (L) 03/30/2017   LDLCALC UNABLE TO CALCULATE IF TRIGLYCERIDE OVER 400 mg/dL 03/30/2017   ALT 16 (L) 03/29/2017   AST 24 03/29/2017   NA 139 04/27/2017   K 4.2 04/27/2017   CL 96 04/27/2017   CREATININE 1.14 04/27/2017   BUN 24 04/27/2017   CO2 26 04/27/2017   INR 1.29 03/30/2017   HGBA1C 8.6 (H) 03/30/2017       Assessment / Plan: Patient is doing remarkably well.  We discussed activity progression including driving and lifting restrictions which he  understands.  We also discussed lifestyle and diet changes that he wishes to incorporate in his daily routines.  We will see him again in 6 months or prior to that as needed for any surgically related issues or he has already been seen in cardiology follow-up last week.          John Giovanni, PA-C 04/30/2017 1:42 PM

## 2017-06-12 ENCOUNTER — Encounter: Payer: Self-pay | Admitting: *Deleted

## 2017-06-12 ENCOUNTER — Other Ambulatory Visit: Payer: Self-pay

## 2017-06-12 ENCOUNTER — Ambulatory Visit (INDEPENDENT_AMBULATORY_CARE_PROVIDER_SITE_OTHER): Payer: Medicare Other | Admitting: Cardiovascular Disease

## 2017-06-12 ENCOUNTER — Encounter: Payer: Self-pay | Admitting: Cardiovascular Disease

## 2017-06-12 VITALS — BP 146/64 | HR 54 | Ht 72.0 in | Wt 220.0 lb

## 2017-06-12 DIAGNOSIS — Z720 Tobacco use: Secondary | ICD-10-CM

## 2017-06-12 DIAGNOSIS — I4891 Unspecified atrial fibrillation: Secondary | ICD-10-CM

## 2017-06-12 DIAGNOSIS — I25708 Atherosclerosis of coronary artery bypass graft(s), unspecified, with other forms of angina pectoris: Secondary | ICD-10-CM | POA: Diagnosis not present

## 2017-06-12 DIAGNOSIS — I10 Essential (primary) hypertension: Secondary | ICD-10-CM | POA: Diagnosis not present

## 2017-06-12 DIAGNOSIS — Z951 Presence of aortocoronary bypass graft: Secondary | ICD-10-CM

## 2017-06-12 DIAGNOSIS — E782 Mixed hyperlipidemia: Secondary | ICD-10-CM | POA: Diagnosis not present

## 2017-06-12 MED ORDER — NITROGLYCERIN 0.4 MG SL SUBL: 0.4000 mg | SUBLINGUAL_TABLET | SUBLINGUAL | 3 refills | Status: AC | PRN

## 2017-06-12 NOTE — Progress Notes (Signed)
SUBJECTIVE: The patient presents to establish care in our [facility] office.  He is a 77 year old male who sustained a non-STEMI and underwent three-vessel CABG on 03/30/17 with a LIMA to the LAD, SVG to PLA, and SVG to first obtuse marginal branch.  He did develop some postoperative atrial fibrillation and was initiated on amiodarone.  Left ventricular systolic function was normal.  Past medical history also includes hypertension, tobacco abuse, type 1 diabetes, and dyslipidemia.  He has anemia.  Hemoglobin was 11.4 on 04/27/17.  Platelets were 418.  Other relevant labs: BUN 24, creatinine 1.14, sodium 139, potassium 4.2.  I personally reviewed the ECG performed on 04/27/17 which demonstrated sinus rhythm of the right bundle branch block.  The patient denies any symptoms of chest pain, palpitations, shortness of breath, lightheadedness, dizziness, leg swelling, orthopnea, PND, and syncope.  Symptoms prior to bypass or shortness of breath.  He denied having chest pain.  He said he got his lipids checked on 05/12/17 by his PCP.  I will request a copy.     Review of Systems: As per "subjective", otherwise negative.  No Known Allergies  Current Outpatient Medications  Medication Sig Dispense Refill  . amiodarone (PACERONE) 200 MG tablet Take 1 tablet (200 mg total) by mouth daily. 90 tablet 3  . amLODipine (NORVASC) 5 MG tablet Take 5 mg daily by mouth.    Marland Kitchen aspirin EC 81 MG tablet Take 81 mg daily by mouth.    . clopidogrel (PLAVIX) 75 MG tablet Take 1 tablet (75 mg total) by mouth daily. 30 tablet 1  . glipiZIDE (GLUCOTROL) 5 MG tablet Take 5 mg daily by mouth.    Marland Kitchen guaiFENesin (MUCINEX) 600 MG 12 hr tablet Take 1 tablet (600 mg total) by mouth 2 (two) times daily. 14 tablet 0  . losartan (COZAAR) 50 MG tablet Take 50 mg daily by mouth.    . metFORMIN (GLUCOPHAGE) 1000 MG tablet Take 1,000 mg 2 (two) times daily by mouth.    . metoprolol tartrate (LOPRESSOR) 25 MG tablet Take 1  tablet (25 mg total) by mouth 2 (two) times daily. 60 tablet 1  . oxyCODONE (OXY IR/ROXICODONE) 5 MG immediate release tablet Take 1 tablet (5 mg total) by mouth every 4 (four) hours as needed for severe pain. 30 tablet 0  . acetaminophen (TYLENOL) 500 MG tablet Take 2 tablets (1,000 mg total) by mouth every 6 (six) hours. 30 tablet 0  . atorvastatin (LIPITOR) 80 MG tablet Take 1 tablet (80 mg total) by mouth daily at 6 PM. (Patient not taking: Reported on 06/12/2017) 30 tablet 1   No current facility-administered medications for this visit.     Past Medical History:  Diagnosis Date  . Current smoker   . Diabetes ([place])   . HTN (hypertension)   . PAF (paroxysmal atrial fibrillation) ([place])    post CABG  . 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 Tobacco abuse     Past Surgical History:  Procedure Laterality Date  . CORONARY ARTERY BYPASS GRAFT N/A 03/30/2017   Procedure: CORONARY ARTERY BYPASS GRAFTING (CABG) x Three, using left internal mammary artery and right leg greater saphenous vein harvested endoscopically;  Surgeon: Rexene Alberts, MD;  Location: [facility];  Service: Open Heart Surgery;  Laterality: N/A;  . LEFT HEART CATH AND CORONARY ANGIOGRAPHY N/A  03/29/2017   Procedure: LEFT HEART CATH AND CORONARY ANGIOGRAPHY;  Surgeon: Lorretta Harp, MD;  Location: [facility];  Service: Cardiovascular;  Laterality: N/A;  . TEE WITHOUT CARDIOVERSION N/A 03/30/2017   Procedure: TRANSESOPHAGEAL ECHOCARDIOGRAM (TEE);  Surgeon: Rexene Alberts, MD;  Location: [facility];  Service: Open Heart Surgery;  Laterality: N/A;    Social History   Socioeconomic History  . Marital status: Married    Spouse name: Engineer, civil (consulting)  . Number of children: 3  . Years of education: 10  . Highest education level: 10th grade  Social Needs  . Financial resource strain: Not hard at all  . Food insecurity - worry: Never  true  . Food insecurity - inability: Never true  . Transportation needs - medical: No  . Transportation needs - non-medical: No  Occupational History  . Not on file  Tobacco Use  . Smoking status: Former Smoker    Packs/day: 2.00    Years: 60.00    Pack years: 120.00    Types: Cigars    Last attempt to quit: 03/28/2017    Years since quitting: 0.2  . Smokeless tobacco: Never Used  . Tobacco comment: Pt states , "I will probably quit now."  Substance and Sexual Activity  . Alcohol use: No    Frequency: Never  . Drug use: No  . Sexual activity: Yes  Other Topics Concern  . Not on file  Social History Narrative  . Not on file     Vitals:   06/12/17 1352  BP: (!) 146/64  Pulse: (!) 54  SpO2: 98%  Weight: 220 lb (99.8 kg)  Height: 6' (1.829 m)    Wt Readings from Last 3 Encounters:  06/12/17 220 lb (99.8 kg)  04/30/17 209 lb (94.8 kg)  04/27/17 208 lb (94.3 kg)     PHYSICAL EXAM General: NAD HEENT: Normal. Neck: No JVD, no thyromegaly. Lungs: Clear to auscultation bilaterally with normal respiratory effort. CV: Regular rate and rhythm, normal S1/S2, no S3/S4, no murmur. No pretibial or periankle edema.  No carotid bruit.   Abdomen: Soft, nontender, no distention.  Neurologic: Alert and oriented.  Psych: Normal affect. Skin: Normal. Musculoskeletal: No gross deformities.    ECG: Most recent ECG reviewed.   Labs: Lab Results  Component Value Date/Time   K 4.2 04/27/2017 08:57 AM   BUN 24 04/27/2017 08:57 AM   CREATININE 1.14 04/27/2017 08:57 AM   ALT 16 (L) 03/29/2017 07:08 PM   HGB 11.4 (L) 04/27/2017 08:57 AM     Lipids: Lab Results  Component Value Date/Time   LDLCALC UNABLE TO CALCULATE IF TRIGLYCERIDE OVER 400 mg/dL 03/30/2017 03:08 AM   CHOL 177 03/30/2017 03:08 AM   TRIG 793 (H) 03/30/2017 03:08 AM   HDL 25 (L) 03/30/2017 03:08 AM       ASSESSMENT AND PLAN: 1.  Coronary artery disease status post three-vessel CABG with non-STEMI:  Symptomatically stable.  Continue aspirin, Plavix, Lipitor 80 mg, losartan, and metoprolol. I will provide a prescription for nitroglycerin.  2.  Postoperative atrial fibrillation: Symptomatically stable.  I will discontinue amiodarone.  3.  Hyperlipidemia: Continue high intensity statin therapy with Lipitor 80 mg.  He said he got his lipids checked on 05/12/17 by his PCP.  I will request a copy.  4.  Tobacco abuse.  5.  Hypertension: Blood pressure is mildly elevated.  It was low normal on 04/30/17.  I will monitor without medication adjustments today other than the discontinuation  of amiodarone which does have some beta blocking activity.   Disposition: Follow up 6 months.   Kate Sable, M.D., F.A.C.C.

## 2017-06-12 NOTE — Patient Instructions (Signed)
Your physician recommends that you schedule a follow-up appointment in: [place] physician has recommended you make the following change in your medication:   STOP AMIODARONE   PLEASE SEEN INFO REGARDING RX FOR NITROGLYCERIN   Thank you for choosing [facility]!!  Nitroglycerin sublingual tablets What is this medicine? NITROGLYCERIN (nye troe GLI ser in) is a type of vasodilator. It relaxes blood vessels, increasing the blood and oxygen supply to your heart. This medicine is used to relieve chest pain caused by angina. It is also used to prevent chest pain before activities like climbing stairs, going outdoors in cold weather, or sexual activity. This medicine may be used for other purposes; ask your health care provider or pharmacist if you have questions. COMMON BRAND NAME(S): Nitroquick, Nitrostat, Nitrotab What should I tell my health care provider before I take this medicine? They need to know if you have any of these conditions: -anemia -head injury, recent stroke, or bleeding in the brain -liver disease -previous heart attack -an unusual or allergic reaction to nitroglycerin, other medicines, foods, dyes, or preservatives -pregnant or trying to get pregnant -breast-feeding How should I use this medicine? Take this medicine by mouth as needed. At the first sign of an angina attack (chest pain or tightness) place one tablet under your tongue. You can also take this medicine 5 to 10 minutes before an event likely to produce chest pain. Follow the directions on the prescription label. Let the tablet dissolve under the tongue. Do not swallow whole. Replace the dose if you accidentally swallow it. It will help if your mouth is not dry. Saliva around the tablet will help it to dissolve more quickly. Do not eat or drink, smoke or chew tobacco while a tablet is dissolving. If you are not better within 5 minutes after taking ONE dose of nitroglycerin, call  9-1-1 immediately to seek emergency medical care. Do not take more than 3 nitroglycerin tablets over 15 minutes. If you take this medicine often to relieve symptoms of angina, your doctor or health care professional may provide you with different instructions to manage your symptoms. If symptoms do not go away after following these instructions, it is important to call 9-1-1 immediately. Do not take more than 3 nitroglycerin tablets over 15 minutes. Talk to your pediatrician regarding the use of this medicine in children. Special care may be needed. Overdosage: If you think you have taken too much of this medicine contact a poison control center or emergency room at once. NOTE: This medicine is only for you. Do not share this medicine with others. What if I miss a dose? This does not apply. This medicine is only used as needed. What may interact with this medicine? Do not take this medicine with any of the following medications: -certain migraine medicines like ergotamine and dihydroergotamine (DHE) -medicines used to treat erectile dysfunction like sildenafil, tadalafil, and vardenafil -riociguat This medicine may also interact with the following medications: -alteplase -aspirin -heparin -medicines for high blood pressure -medicines for mental depression -other medicines used to treat angina -phenothiazines like chlorpromazine, mesoridazine, prochlorperazine, thioridazine This list may not describe all possible interactions. Give your health care provider a list of all the medicines, herbs, non-prescription drugs, or dietary supplements you use. Also tell them if you smoke, drink alcohol, or use illegal drugs. Some items may interact with your medicine. What should I watch for while using this medicine? Tell your doctor or health care professional if you  feel your medicine is no longer working. Keep this medicine with you at all times. Sit or lie down when you take your medicine to prevent  falling if you feel dizzy or faint after using it. Try to remain calm. This will help you to feel better faster. If you feel dizzy, take several deep breaths and lie down with your feet propped up, or bend forward with your head resting between your knees. You may get drowsy or dizzy. Do not drive, use machinery, or do anything that needs mental alertness until you know how this drug affects you. Do not stand or sit up quickly, especially if you are an older patient. This reduces the risk of dizzy or fainting spells. Alcohol can make you more drowsy and dizzy. Avoid alcoholic drinks. Do not treat yourself for coughs, colds, or pain while you are taking this medicine without asking your doctor or health care professional for advice. Some ingredients may increase your blood pressure. What side effects may I notice from receiving this medicine? Side effects that you should report to your doctor or health care professional as soon as possible: -blurred vision -dry mouth -skin rash -sweating -the feeling of extreme pressure in the head -unusually weak or tired Side effects that usually do not require medical attention (report to your doctor or health care professional if they continue or are bothersome): -flushing of the face or neck -headache -irregular heartbeat, palpitations -nausea, vomiting This list may not describe all possible side effects. Call your doctor for medical advice about side effects. You may report side effects to FDA at 1-800-FDA-1088. Where should I keep my medicine? Keep out of the reach of children. Store at room temperature between 20 and 25 degrees C (68 and 77 degrees F). Store in [facility]. Protect from light and moisture. Keep tightly closed. Throw away any unused medicine after the expiration date. NOTE: This sheet is a summary. It may not cover all possible information. If you have questions about this medicine, talk to your doctor, pharmacist, or health care  provider.  2018 Elsevier/Gold Standard (2013-02-27 17:57:36)

## 2017-06-18 ENCOUNTER — Ambulatory Visit: Payer: Medicare Other | Admitting: Interventional Cardiology

## 2017-09-03 ENCOUNTER — Telehealth: Payer: Self-pay

## 2017-09-03 MED ORDER — AZILSARTAN MEDOXOMIL 40 MG PO TABS: 40.0000 mg | ORAL_TABLET | Freq: Every day | ORAL | 2 refills | Status: DC

## 2017-09-03 NOTE — Telephone Encounter (Signed)
Can try Edarbi 40 mg.

## 2017-09-03 NOTE — Telephone Encounter (Signed)
Wife advised Edarbi 40 mg will replace losartan 50 mg. Sent to [facility] per request. No further questions.

## 2017-09-03 NOTE — Telephone Encounter (Signed)
Patient's wife called stating that the batch of losartan from patient's pharmacy has been recalled. Please advise on alternative. Thank you.  Losartan 50 mg

## 2017-10-29 ENCOUNTER — Encounter: Payer: Medicare Other | Admitting: Thoracic Surgery (Cardiothoracic Vascular Surgery)

## 2017-11-12 ENCOUNTER — Encounter: Payer: Self-pay | Admitting: Thoracic Surgery (Cardiothoracic Vascular Surgery)

## 2017-11-12 ENCOUNTER — Other Ambulatory Visit: Payer: Self-pay

## 2017-11-12 ENCOUNTER — Ambulatory Visit: Payer: Medicare Other | Admitting: Thoracic Surgery (Cardiothoracic Vascular Surgery)

## 2017-11-12 VITALS — BP 142/78 | HR 92 | Resp 18 | Ht 72.0 in | Wt 209.8 lb

## 2017-11-12 DIAGNOSIS — Z951 Presence of aortocoronary bypass graft: Secondary | ICD-10-CM | POA: Diagnosis not present

## 2017-11-12 NOTE — Progress Notes (Signed)
[address]       [place] [address]             [phone]     CARDIOTHORACIC SURGERY OFFICE NOTE  Referring Provider is Lorretta Harp, MD  Primary Cardiologist is Herminio Commons, MD PCP is Sherrilyn Rist   HPI:  Patient is a 77 year old male with history of coronary artery disease, hypertension, type 2 diabetes mellitus, hyperlipidemia, and tobacco abuse who returns to the office today for routine follow-up more than 6 months status post coronary artery bypass grafting x3 for severe three-vessel coronary artery disease status post acute non-ST segment elevation myocardial infarction on March 30, 2017.  The patient's early postoperative recovery was notable for transient postoperative atrial fibrillation which promptly converted to sinus rhythm on oral amiodarone.  Postoperatively the patient did quite well and was last seen here in our office on April 30, 2017.  Since then he has been seen in follow-up by Dr. Bronson Ing on June 12, 2017.  The patient has remained in sinus rhythm and amiodarone was stopped at that time.  He returns for office today and reports that he is doing exceptionally well.  He states that within a month of the surgery he felt better than he has felt in several years.  He has not gone back to smoking any cigarettes.  He states that he is very active physically and he reports no limitations.  He specifically denies any exertional chest discomfort or shortness of breath.  He states that he did stop taking Lipitor because of muscular aches in both legs.  The symptoms resolved when he quit taking it.  Well-appearing  Current Outpatient Medications  Medication Sig Dispense Refill  . acetaminophen (TYLENOL) 500 MG tablet Take 2 tablets (1,000 mg total) by mouth every 6 (six) hours. 30 tablet 0  . aspirin EC 81 MG tablet Take 81 mg daily by mouth.    Marland Kitchen glipiZIDE (GLUCOTROL) 5 MG tablet Take 5 mg daily by mouth.    . losartan  (COZAAR) 50 MG tablet Take 50 mg daily by mouth.    . metFORMIN (GLUCOPHAGE) 1000 MG tablet Take 1,000 mg 2 (two) times daily by mouth.    . nitroGLYCERIN (NITROSTAT) 0.4 MG SL tablet Place 1 tablet (0.4 mg total) under the tongue every 5 (five) minutes as needed for chest pain. 25 tablet 3   No current facility-administered medications for this visit.       Physical Exam:   BP (!) 142/78 (BP Location: Left Arm, Patient Position: Sitting, Cuff Size: Normal)   Pulse 92   Resp 18   Ht 6' (1.829 m)   Wt 209 lb 12.8 oz (95.2 kg)   SpO2 97% Comment: RA  BMI 28.45 kg/m   General:  Well-appearing  Chest:   Clear to auscultation  CV:   Regular rate and rhythm  Incisions:  Completely healed  Abdomen:  Soft nontender  Extremities:  Warm and well-perfused  Diagnostic Tests:  n/a   Impression:  Patient is doing very well more than 6 months status post coronary artery bypass grafting  Plan:  I have encouraged the patient to continue to increase his physical activity without any particular limitations at this time.  I have suggested that the patient discussed with his primary care physician and/or Dr. Bronson Ing the potential benefits of trying a different statin or other medication for medical treatment of hyperlipidemia.  The many benefits of regular  exercise and a heart healthy diet have been emphasized.  Close follow-up with his primary care physician for management of diabetes has been encouraged.  All of his questions have been addressed.  I spent in excess of 15 minutes during the conduct of this office consultation and >50% of this time involved direct face-to-face encounter with the patient for counseling and/or coordination of their care.    Valentina Gu. Roxy Manns, MD 11/12/2017 2:34 PM

## 2017-11-12 NOTE — Patient Instructions (Addendum)
Continue all previous medications without any changes at this time  You may resume unrestricted physical activity without any particular limitations at this time.  Make every effort to stay physically active, get some type of exercise on a regular basis, and stick to a "heart healthy diet".  The long term benefits for regular exercise and a healthy diet are critically important to your overall health and wellbeing.  Discuss with your cardiologist and primary care physician whether or not you should try taking a "statin" drug or other type of agent to improve your cholesterol

## 2017-12-14 ENCOUNTER — Ambulatory Visit: Payer: Medicare Other | Admitting: Cardiovascular Disease

## 2017-12-14 ENCOUNTER — Encounter: Payer: Self-pay | Admitting: Cardiovascular Disease

## 2017-12-14 VITALS — BP 140/69 | HR 97 | Ht 72.0 in | Wt 211.0 lb

## 2017-12-14 DIAGNOSIS — I4891 Unspecified atrial fibrillation: Secondary | ICD-10-CM

## 2017-12-14 DIAGNOSIS — I10 Essential (primary) hypertension: Secondary | ICD-10-CM | POA: Diagnosis not present

## 2017-12-14 DIAGNOSIS — I25708 Atherosclerosis of coronary artery bypass graft(s), unspecified, with other forms of angina pectoris: Secondary | ICD-10-CM

## 2017-12-14 DIAGNOSIS — M791 Myalgia, unspecified site: Secondary | ICD-10-CM

## 2017-12-14 DIAGNOSIS — T466X5A Adverse effect of antihyperlipidemic and antiarteriosclerotic drugs, initial encounter: Secondary | ICD-10-CM

## 2017-12-14 DIAGNOSIS — E785 Hyperlipidemia, unspecified: Secondary | ICD-10-CM | POA: Diagnosis not present

## 2017-12-14 MED ORDER — ROSUVASTATIN CALCIUM 5 MG PO TABS: 5.0000 mg | ORAL_TABLET | Freq: Every day | ORAL | 1 refills | Status: DC

## 2017-12-14 NOTE — Progress Notes (Signed)
SUBJECTIVE: The patient presents for routine follow-up. He is a 77 year old male who sustained a non-STEMI and underwent three-vessel CABG on 03/30/17 with a LIMA to the LAD, SVG to PLA, and SVG to first obtuse marginal branch.  He did develop some postoperative atrial fibrillation and was initiated on amiodarone which I subsequently discontinued on 06/12/2017.  Left ventricular systolic function was normal.  Past medical history also includes hypertension, tobacco abuse, type 1 diabetes, and dyslipidemia.  He had been on atorvastatin 80 mg at his last visit with me but stopped it on his own due to muscle aches his legs.  The patient denies any symptoms of chest pain, palpitations, shortness of breath, lightheadedness, dizziness, leg swelling, orthopnea, PND, and syncope.  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  He likes being outdoors and working but goes inside when it is hot.   Review of Systems: As per "subjective", otherwise negative.  No Known Allergies  Current Outpatient Medications  Medication Sig Dispense Refill  . acetaminophen (TYLENOL) 500 MG tablet Take 2 tablets (1,000 mg total) by mouth every 6 (six) hours. 30 tablet 0  . aspirin EC 81 MG tablet Take 81 mg daily by mouth.    Marland Kitchen glipiZIDE (GLUCOTROL) 5 MG tablet Take 5 mg daily by mouth.    . losartan (COZAAR) 50 MG tablet Take 50 mg daily by mouth.    . metFORMIN (GLUCOPHAGE) 1000 MG tablet Take 1,000 mg 2 (two) times daily by mouth.    . nitroGLYCERIN (NITROSTAT) 0.4 MG SL tablet Place 1 tablet (0.4 mg total) under the tongue every 5 (five) minutes as needed for chest pain. 25 tablet 3   No current facility-administered medications for this visit.     Past Medical History:  Diagnosis Date  . Current smoker   . Diabetes ([place])   . HTN (hypertension)   . PAF (paroxysmal atrial fibrillation) ([place])    post CABG  . 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 Tobacco abuse     Past Surgical History:  Procedure Laterality Date  . CORONARY ARTERY BYPASS GRAFT N/A 03/30/2017   Procedure: CORONARY ARTERY BYPASS GRAFTING (CABG) x Three, using left internal mammary artery and right leg greater saphenous vein harvested endoscopically;  Surgeon: Rexene Alberts, MD;  Location: [facility];  Service: Open Heart Surgery;  Laterality: N/A;  . LEFT HEART CATH AND CORONARY ANGIOGRAPHY N/A 03/29/2017   Procedure: LEFT HEART CATH AND CORONARY ANGIOGRAPHY;  Surgeon: Lorretta Harp, MD;  Location: [facility];  Service: Cardiovascular;  Laterality: N/A;  . TEE WITHOUT CARDIOVERSION N/A 03/30/2017   Procedure: TRANSESOPHAGEAL ECHOCARDIOGRAM (TEE);  Surgeon: Rexene Alberts, MD;  Location: [facility];  Service: Open Heart Surgery;  Laterality: N/A;    Social History   Socioeconomic History  . Marital status: Married    Spouse name: Engineer, civil (consulting)  . Number of children: 3  . Years of education: 10  . Highest education level: 10th grade  Occupational History  . Not on file  Social Needs  . Financial resource strain: Not hard at all  . Food insecurity:    Worry: Never true    Inability: Never true  . Transportation needs:    Medical: No    Non-medical: No  Tobacco Use  . Smoking status: Current Every Day  Smoker    Packs/day: 2.00    Years: 60.00    Pack years: 120.00    Types: Cigars    Last attempt to quit: 03/28/2017    Years since quitting: 0.7  . Smokeless tobacco: Never Used  . Tobacco comment: Pt states , "I will probably quit now."  Substance and Sexual Activity  . Alcohol use: No    Frequency: Never  . Drug use: No  . Sexual activity: Yes  Lifestyle  . Physical activity:    Days per week: 5 days    Minutes per session: Not on file  . Stress: Not at all  Relationships  . Social connections:    Talks on phone: Patient refused    Gets together: Patient refused    Attends religious service:  Patient refused    Active member of club or organization: Patient refused    Attends meetings of clubs or organizations: Patient refused    Relationship status: Patient refused  . Intimate partner violence:    Fear of current or ex partner: Patient refused    Emotionally abused: Patient refused    Physically abused: Patient refused    Forced sexual activity: Patient refused  Other Topics Concern  . Not on file  Social History Narrative  . Not on file     Vitals:   12/14/17 1554  BP: 140/69  Pulse: 97  SpO2: 98%  Weight: 211 lb (95.7 kg)  Height: 6' (1.829 m)    Wt Readings from Last 3 Encounters:  12/14/17 211 lb (95.7 kg)  11/12/17 209 lb 12.8 oz (95.2 kg)  06/12/17 220 lb (99.8 kg)     PHYSICAL EXAM General: NAD HEENT: Normal. Neck: No JVD, no thyromegaly. Lungs: Clear to auscultation bilaterally with normal respiratory effort. CV: Regular rate and rhythm, normal S1/S2, no S3/S4, no murmur. No pretibial or periankle edema.  No carotid bruit.   Abdomen: Soft, nontender, no distention.  Neurologic: Alert and oriented.  Psych: Normal affect. Skin: Normal. Musculoskeletal: No gross deformities.    ECG: Reviewed above under Subjective   Labs: Lab Results  Component Value Date/Time   K 4.2 04/27/2017 08:57 AM   BUN 24 04/27/2017 08:57 AM   CREATININE 1.14 04/27/2017 08:57 AM   ALT 16 (L) 03/29/2017 07:08 PM   HGB 11.4 (L) 04/27/2017 08:57 AM     Lipids: Lab Results  Component Value Date/Time   LDLCALC UNABLE TO CALCULATE IF TRIGLYCERIDE OVER 400 mg/dL 03/30/2017 03:08 AM   CHOL 177 03/30/2017 03:08 AM   TRIG 793 (H) 03/30/2017 03:08 AM   HDL 25 (L) 03/30/2017 03:08 AM       ASSESSMENT AND PLAN: 1.  Coronary artery disease status post three-vessel CABG with non-STEMI: Symptomatically stable.    He is currently on aspirin and losartan and no longer on metoprolol or atorvastatin.  He stopped taking Lipitor due to myalgias.  I will try low-dose Crestor 5  mg daily.  2.  Postoperative atrial fibrillation: Symptomatically stable.  I deviously discontinued amiodarone.  3.  Hyperlipidemia: He has been on high intensity statin therapy with Lipitor 80 mg but stopped it on his own due to myalgias.  I will try low-dose Crestor 5 mg daily.  4.  Tobacco abuse: He remains in remission.  5.  Hypertension: Blood pressure is mildly elevated on losartan 50 mg.  This will need further monitoring.  He has been on metoprolol at his last visit with me.  No changes to therapy at  this time.      Disposition: Follow up 6 months   Kate Sable, M.D., F.A.C.C.

## 2017-12-14 NOTE — Patient Instructions (Signed)
Your physician wants you to follow-up in: [place] will receive a reminder letter in the mail two months in advance. If you don't receive a letter, please call our office to schedule the follow-up appointment.  Your physician has recommended you make the following change in your medication:   START CRESTOR 5 MG DAILY   Thank you for choosing [facility]!!

## 2018-04-15 ENCOUNTER — Encounter: Payer: Self-pay | Admitting: Thoracic Surgery (Cardiothoracic Vascular Surgery)

## 2018-04-15 ENCOUNTER — Other Ambulatory Visit: Payer: Self-pay

## 2018-04-15 ENCOUNTER — Ambulatory Visit: Payer: Medicare Other | Admitting: Thoracic Surgery (Cardiothoracic Vascular Surgery)

## 2018-04-15 VITALS — BP 130/70 | HR 88 | Ht 72.0 in | Wt 215.8 lb

## 2018-04-15 DIAGNOSIS — Z951 Presence of aortocoronary bypass graft: Secondary | ICD-10-CM | POA: Diagnosis not present

## 2018-04-15 NOTE — Patient Instructions (Addendum)
Stop smoking immediately and permanently.  Continue all previous medications without any changes at this time  Make every effort to stay physically active, get some type of exercise on a regular basis, and stick to a "heart healthy diet".  The long term benefits for regular exercise and a healthy diet are critically important to your overall health and wellbeing.  Make every effort to keep your diabetes under very tight control.  Follow up closely with your primary care physician or endocrinologist and strive to keep their hemoglobin A1c levels as low as possible, preferably near or below 6.0.  The long term benefits of strict control of diabetes are far reaching and critically important for your overall health and survival.

## 2018-04-15 NOTE — Progress Notes (Signed)
[address]       [place] [address]             [phone]     CARDIOTHORACIC SURGERY OFFICE NOTE  Referring Provider is Lorretta Harp, MD  Primary Cardiologist is Herminio Commons, MD PCP is Sherrilyn Rist   HPI:  Patient is a 77 year old male with history of coronary artery disease, hypertension, type 2 diabetes mellitus, hyperlipidemia, and tobacco abuse who returns to the office today for routine follow-up approximately 1 year status post coronary artery bypass grafting x3 for severe three-vessel coronary artery disease status post acute non-ST segment elevation myocardial infarction on March 30, 2017.  He was last seen here in our office on November 12, 2017 at which time he was doing well.  Since then he has been seen in follow-up on one occasion by Dr. Bronson Ing and more recently he was seen in follow-up by his primary care provider, Candi Leash.  He returns to our office today for routine follow-up.  He reports that he is doing exceptionally well.  He states that he is quite active physically.  He spends a great deal of time outdoors chopping wood and working around his house on his property.  He reports no physical limitations and he states that he feels much better than he did prior to surgery 1 year ago.  He denies any symptoms of exertional shortness of breath or chest discomfort.  He states that his blood sugars have been under good control.  He admits that he does still sneak a cigarette every once in a while when he is out working in the yard.   Current Outpatient Medications  Medication Sig Dispense Refill  . acetaminophen (TYLENOL) 500 MG tablet Take 2 tablets (1,000 mg total) by mouth every 6 (six) hours. 30 tablet 0  . aspirin EC 81 MG tablet Take 81 mg daily by mouth.    Marland Kitchen glipiZIDE (GLUCOTROL) 5 MG tablet Take 5 mg daily by mouth.    . losartan (COZAAR) 50 MG tablet Take 50 mg daily by mouth.    . metFORMIN (GLUCOPHAGE) 1000 MG  tablet Take 1,000 mg 2 (two) times daily by mouth.    . nitroGLYCERIN (NITROSTAT) 0.4 MG SL tablet Place 1 tablet (0.4 mg total) under the tongue every 5 (five) minutes as needed for chest pain. 25 tablet 3  . rosuvastatin (CRESTOR) 5 MG tablet Take 1 tablet (5 mg total) by mouth daily. 90 tablet 1   No current facility-administered medications for this visit.       Physical Exam:   BP 130/70 (BP Location: Right Arm, Patient Position: Sitting, Cuff Size: Normal)   Pulse 88   Ht 6' (1.829 m)   Wt 215 lb 12.8 oz (97.9 kg)   SpO2 97% Comment: RA  BMI 29.27 kg/m   General:  Well-appearing  Chest:   Clear to auscultation  CV:   Regular rate and rhythm without murmur  Incisions:  Completely healed, sternum is stable  Abdomen:  Soft nontender  Extremities:  Warm and well-perfused  Diagnostic Tests:  n/a   Impression:  Patient is doing very well more than 1 year status post coronary artery bypass grafting  Plan:  We have not recommended any change to the patient's current medications.  I have encouraged the patient to find a way to quit smoking completely.  We discussed importance of close follow-up for management of his diabetes and glycemic  control.  The importance of regular exercise and heart healthy diet has been emphasized.  In the future he will call and return to see [name] only should specific problems or questions arise.  I spent in excess of 15 minutes during the conduct of this office consultation and >50% of this time involved direct face-to-face encounter with the patient for counseling and/or coordination of their care.   Valentina Gu. Roxy Manns, MD 04/15/2018 12:13 PM

## 2018-05-06 ENCOUNTER — Other Ambulatory Visit: Payer: Self-pay

## 2018-05-06 ENCOUNTER — Emergency Department (HOSPITAL_COMMUNITY)
Admission: EM | Admit: 2018-05-06 | Discharge: 2018-05-06 | Disposition: A | Discharge status: Home / Self Care | Payer: Medicare Other | Attending: Emergency Medicine | Admitting: Emergency Medicine

## 2018-05-06 ENCOUNTER — Emergency Department (HOSPITAL_COMMUNITY): Payer: Medicare Other

## 2018-05-06 ENCOUNTER — Encounter (HOSPITAL_COMMUNITY): Payer: Self-pay | Admitting: Emergency Medicine

## 2018-05-06 DIAGNOSIS — E109 Type 1 diabetes mellitus without complications: Secondary | ICD-10-CM | POA: Insufficient documentation

## 2018-05-06 DIAGNOSIS — Z7982 Long term (current) use of aspirin: Secondary | ICD-10-CM | POA: Diagnosis not present

## 2018-05-06 DIAGNOSIS — I480 Paroxysmal atrial fibrillation: Secondary | ICD-10-CM | POA: Insufficient documentation

## 2018-05-06 DIAGNOSIS — M25562 Pain in left knee: Secondary | ICD-10-CM | POA: Diagnosis present

## 2018-05-06 DIAGNOSIS — Z7984 Long term (current) use of oral hypoglycemic drugs: Secondary | ICD-10-CM | POA: Insufficient documentation

## 2018-05-06 DIAGNOSIS — I10 Essential (primary) hypertension: Secondary | ICD-10-CM | POA: Diagnosis not present

## 2018-05-06 DIAGNOSIS — F17290 Nicotine dependence, other tobacco product, uncomplicated: Secondary | ICD-10-CM | POA: Diagnosis not present

## 2018-05-06 MED ORDER — NAPROXEN 500 MG PO TABS: 500.0000 mg | ORAL_TABLET | Freq: Two times a day (BID) | ORAL | 0 refills | Status: DC

## 2018-05-06 MED ORDER — IBUPROFEN 800 MG PO TABS
800.0000 mg | ORAL_TABLET | Freq: Once | ORAL | Status: AC
  Administered 2018-05-06: 800 mg via ORAL
  Filled 2018-05-06: qty 1

## 2018-05-06 NOTE — ED Provider Notes (Signed)
[facility] EMERGENCY DEPARTMENT Provider Note   CSN: [id number] Arrival date & time: 05/06/18  2135     History   Chief Complaint Chief Complaint  Patient presents with  . Knee Pain    HPI Bryce Powell is a 77 y.o. male.  Patient complains of left knee pain.  Patient states he has had gout before that knee  The history is provided by the patient. No language interpreter was used.  Knee Pain   This is a new problem. The current episode started more than 2 days ago. The problem occurs constantly. The problem has not changed since onset.Pain location: Left knee. The quality of the pain is described as aching. The pain is at a severity of 4/10. The pain is moderate. Associated symptoms include limited range of motion. The symptoms are aggravated by standing. He has tried nothing for the symptoms. There has been no history of extremity trauma. Family history is significant for gout.    Past Medical History:  Diagnosis Date  . Current smoker   . Diabetes ([place])   . HTN (hypertension)   . PAF (paroxysmal atrial fibrillation) ([place])    post CABG  . 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 Tobacco abuse     Patient Active Problem List   Diagnosis Date Noted  . 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 Tobacco abuse   . NSTEMI (non-ST elevated myocardial infarction) ([place]) 03/28/2017  . HTN (hypertension) 03/28/2017  . 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 Angina at rest [facility]) 03/28/2017    Past Surgical History:  Procedure Laterality Date  . CORONARY ARTERY BYPASS GRAFT N/A 03/30/2017   Procedure: CORONARY ARTERY BYPASS GRAFTING (CABG) x Three, using left internal mammary artery and right leg greater saphenous vein harvested endoscopically;  Surgeon: Rexene Alberts, MD;  Location: [facility];  Service: Open Heart Surgery;  Laterality: N/A;  . LEFT HEART CATH AND CORONARY ANGIOGRAPHY N/A 03/29/2017    Procedure: LEFT HEART CATH AND CORONARY ANGIOGRAPHY;  Surgeon: Lorretta Harp, MD;  Location: [facility];  Service: Cardiovascular;  Laterality: N/A;  . TEE WITHOUT CARDIOVERSION N/A 03/30/2017   Procedure: TRANSESOPHAGEAL ECHOCARDIOGRAM (TEE);  Surgeon: Rexene Alberts, MD;  Location: [facility];  Service: Open Heart Surgery;  Laterality: N/A;        Home Medications    Prior to Admission medications   Medication Sig Start Date End Date Taking? Authorizing Provider  acetaminophen (TYLENOL) 500 MG tablet Take 2 tablets (1,000 mg total) by mouth every 6 (six) hours. 04/04/17  Yes Elgie Collard, PA-C  aspirin EC 81 MG tablet Take 81 mg daily by mouth.   Yes [provider]  glipiZIDE (GLUCOTROL) 5 MG tablet Take 5 mg daily by mouth.   Yes [provider]  losartan (COZAAR) 50 MG tablet Take 50 mg daily by mouth.   Yes [provider]  metFORMIN (GLUCOPHAGE) 1000 MG tablet Take 1,000 mg 2 (two) times daily by mouth. 03/01/17  Yes [provider]  nitroGLYCERIN (NITROSTAT) 0.4 MG SL tablet Place 1 tablet (0.4 mg total) under the tongue every 5 (five) minutes as needed for chest pain. 06/12/17 05/06/18 Yes Herminio Commons, MD  rosuvastatin (CRESTOR) 5 MG tablet Take 1 tablet (5 mg total) by mouth daily. Patient taking differently: Take 5 mg  by mouth every morning.  12/14/17 05/06/18 Yes Herminio Commons, MD  naproxen (NAPROSYN) 500 MG tablet Take 1 tablet (500 mg total) by mouth 2 (two) times daily. 05/06/18   Milton Ferguson, MD    Family History Family History  Problem Relation Age of Onset  . Diabetes Mother   . Heart attack Mother   . Heart attack Father     Social History Social History   Tobacco Use  . Smoking status: Current Some Day Smoker    Packs/day: 2.00    Years: 60.00    Pack years: 120.00    Types: Cigars    Last attempt to quit: 03/28/2017    Years since quitting: 1.1  . Smokeless tobacco: Never Used  . Tobacco  comment: Pt states , "I will probably quit now."  Substance Use Topics  . Alcohol use: No    Frequency: Never  . Drug use: No     Allergies   Atorvastatin   Review of Systems Review of Systems  Constitutional: Negative for appetite change and fatigue.  HENT: Negative for congestion, ear discharge and sinus pressure.   Eyes: Negative for discharge.  Respiratory: Negative for cough.   Cardiovascular: Negative for chest pain.  Gastrointestinal: Negative for abdominal pain and diarrhea.  Genitourinary: Negative for frequency and hematuria.  Musculoskeletal: Negative for back pain.       Left knee pain  Skin: Negative for rash.  Neurological: Negative for seizures and headaches.  Psychiatric/Behavioral: Negative for hallucinations.     Physical Exam Updated Vital Signs BP (!) 152/72 (BP Location: Right Arm)   Pulse 98   Temp (!) 97.5 F (36.4 C) (Oral)   Resp 19   Ht 6' (1.829 m)   Wt 96.2 kg   SpO2 98%   BMI 28.75 kg/m   Physical Exam Vitals signs reviewed.  Constitutional:      Appearance: He is well-developed.  HENT:     Head: Normocephalic.     Nose: Nose normal.  Eyes:     Conjunctiva/sclera: Conjunctivae normal.  Neck:     Trachea: No tracheal deviation.  Pulmonary:     Effort: Pulmonary effort is normal.  Musculoskeletal:     Comments: Normal tenderness left knee  Skin:    General: Skin is warm.     Capillary Refill: Capillary refill takes less than 2 seconds.  Neurological:     Mental Status: He is alert and oriented to person, place, and time.  Psychiatric:        Mood and Affect: Mood normal.      ED Treatments / Results  Labs (all labs ordered are listed, but only abnormal results are displayed) Labs Reviewed - No data to display  EKG None  Radiology Dg Knee Complete 4 Views Left  Result Date: 05/06/2018 CLINICAL DATA:  Left knee pain, no known injury, initial encounter EXAM: LEFT KNEE - COMPLETE 4+ VIEW COMPARISON:  None.  FINDINGS: Tricompartmental degenerative changes are noted most marked in the medial joint space. No acute fracture or dislocation is noted. No joint effusion is seen. Diffuse vascular calcifications are noted. IMPRESSION: Degenerative change without acute abnormality. Electronically Signed   By: Inez Catalina M.D.   On: 05/06/2018 22:16    Procedures Procedures (including critical care time)  Medications Ordered in ED Medications  ibuprofen (ADVIL,MOTRIN) tablet 800 mg (has no administration in time range)     Initial Impression / Assessment and Plan / ED Course  I have reviewed the  triage vital signs and the nursing notes.  Pertinent labs & imaging results that were available during my care of the patient were reviewed by me and considered in my medical decision making (see chart for details).     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  Patient is placed on Naprosyn for inflammation of the left knee.  Doubt gout.  More likely soft tissue strain  Final diagnoses:  Acute pain of left knee    ED Discharge Orders         Ordered    naproxen (NAPROSYN) 500 MG tablet  2 times daily     05/06/18 2323           Milton Ferguson, MD 05/06/18 2328

## 2018-05-06 NOTE — ED Notes (Signed)
Patient transported to X-ray 

## 2018-05-06 NOTE — ED Triage Notes (Signed)
Pt reports knee pain since Saturday morning. Pt states has history of gout in knee and that's what it feels like

## 2018-05-06 NOTE — Discharge Instructions (Signed)
Follow-up with your doctor next week for recheck 

## 2018-05-26 ENCOUNTER — Other Ambulatory Visit: Payer: Self-pay | Admitting: Cardiovascular Disease

## 2018-06-19 ENCOUNTER — Encounter: Payer: Self-pay | Admitting: *Deleted

## 2018-06-19 ENCOUNTER — Ambulatory Visit: Payer: Medicare Other | Admitting: Cardiovascular Disease

## 2018-06-19 ENCOUNTER — Encounter: Payer: Self-pay | Admitting: Cardiovascular Disease

## 2018-06-19 VITALS — BP 148/63 | HR 76 | Ht 72.0 in | Wt 213.4 lb

## 2018-06-19 DIAGNOSIS — E785 Hyperlipidemia, unspecified: Secondary | ICD-10-CM

## 2018-06-19 DIAGNOSIS — I10 Essential (primary) hypertension: Secondary | ICD-10-CM

## 2018-06-19 DIAGNOSIS — I25708 Atherosclerosis of coronary artery bypass graft(s), unspecified, with other forms of angina pectoris: Secondary | ICD-10-CM

## 2018-06-19 NOTE — Progress Notes (Signed)
SUBJECTIVE: The patient presents for routine follow-up.  He sustained a non-STEMI and underwent three-vessel CABG on 03/30/17 with a LIMA to the LAD, SVG to PLA, and SVG to first obtuse marginal branch. He did develop some postoperative atrial fibrillation and was initiated on amiodarone which I subsequently discontinued on 06/12/2017.  Left ventricular systolic function was normal.  Past medical history also includes hypertension, tobacco abuse, type 1 diabetes, and dyslipidemia.  He did not tolerate atorvastatin 80 mg.  He is currently on Crestor 5 mg daily.  ECG performed in the office today which I ordered and personally interpreted demonstrates normal sinus rhythm with right bundle branch block.  The patient denies any symptoms of chest pain, palpitations, shortness of breath, lightheadedness, dizziness, leg swelling, orthopnea, PND, and syncope.  He has been active outdoors cutting down trees and chopping wood.   Review of Systems: As per "subjective", otherwise negative.  Allergies  Allergen Reactions  . Atorvastatin     MUSCLE ACHES     Current Outpatient Medications  Medication Sig Dispense Refill  . acetaminophen (TYLENOL) 500 MG tablet Take 2 tablets (1,000 mg total) by mouth every 6 (six) hours. 30 tablet 0  . aspirin EC 81 MG tablet Take 81 mg daily by mouth.    . colchicine 0.6 MG tablet Take 1 tablet by mouth 2 (two) times daily as needed.    Marland Kitchen glipiZIDE (GLUCOTROL) 5 MG tablet Take 5 mg daily by mouth.    . losartan (COZAAR) 50 MG tablet Take 50 mg daily by mouth.    . metFORMIN (GLUCOPHAGE) 1000 MG tablet Take 1,000 mg 2 (two) times daily by mouth.    . nitroGLYCERIN (NITROSTAT) 0.4 MG SL tablet Place 1 tablet (0.4 mg total) under the tongue every 5 (five) minutes as needed for chest pain. 25 tablet 3  . rosuvastatin (CRESTOR) 5 MG tablet TAKE 1 TABLET BY MOUTH  DAILY 90 tablet 1   No current facility-administered medications for this visit.     Past  Medical History:  Diagnosis Date  . Current smoker   . Diabetes ([place])   . HTN (hypertension)   . PAF (paroxysmal atrial fibrillation) ([place])    post CABG  . 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 Tobacco abuse     Past Surgical History:  Procedure Laterality Date  . CORONARY ARTERY BYPASS GRAFT N/A 03/30/2017   Procedure: CORONARY ARTERY BYPASS GRAFTING (CABG) x Three, using left internal mammary artery and right leg greater saphenous vein harvested endoscopically;  Surgeon: Rexene Alberts, MD;  Location: [facility];  Service: Open Heart Surgery;  Laterality: N/A;  . LEFT HEART CATH AND CORONARY ANGIOGRAPHY N/A 03/29/2017   Procedure: LEFT HEART CATH AND CORONARY ANGIOGRAPHY;  Surgeon: Lorretta Harp, MD;  Location: [facility];  Service: Cardiovascular;  Laterality: N/A;  . TEE WITHOUT CARDIOVERSION N/A 03/30/2017   Procedure: TRANSESOPHAGEAL ECHOCARDIOGRAM (TEE);  Surgeon: Rexene Alberts, MD;  Location: [facility];  Service: Open Heart Surgery;  Laterality: N/A;    Social History   Socioeconomic History  . Marital status: Married    Spouse name: Engineer, civil (consulting)  . Number of children: 3  . Years of education: 10  . Highest education level: 10th grade  Occupational History  . Not on file  Social Needs  . Emergency planning/management officer  strain: Not hard at all  . Food insecurity:    Worry: Never true    Inability: Never true  . Transportation needs:    Medical: No    Non-medical: No  Tobacco Use  . Smoking status: Current Some Day Smoker    Packs/day: 2.00    Years: 60.00    Pack years: 120.00    Types: Cigars    Last attempt to quit: 03/28/2017    Years since quitting: 1.2  . Smokeless tobacco: Never Used  . Tobacco comment: Pt states , "I will probably quit now."  Substance and Sexual Activity  . Alcohol use: No    Frequency: Never  . Drug use: No  . Sexual activity: Yes  Lifestyle  .  Physical activity:    Days per week: 5 days    Minutes per session: Not on file  . Stress: Not at all  Relationships  . Social connections:    Talks on phone: Patient refused    Gets together: Patient refused    Attends religious service: Patient refused    Active member of club or organization: Patient refused    Attends meetings of clubs or organizations: Patient refused    Relationship status: Patient refused  . Intimate partner violence:    Fear of current or ex partner: Patient refused    Emotionally abused: Patient refused    Physically abused: Patient refused    Forced sexual activity: Patient refused  Other Topics Concern  . Not on file  Social History Narrative  . Not on file     Vitals:   06/19/18 1124  BP: (!) 148/63  Pulse: 76  SpO2: 98%  Weight: 213 lb 6.4 oz (96.8 kg)  Height: 6' (1.829 m)    Wt Readings from Last 3 Encounters:  06/19/18 213 lb 6.4 oz (96.8 kg)  05/06/18 212 lb (96.2 kg)  04/15/18 215 lb 12.8 oz (97.9 kg)     PHYSICAL EXAM General: NAD HEENT: Normal. Neck: No JVD, no thyromegaly. Lungs: Clear to auscultation bilaterally with normal respiratory effort. CV: Regular rate and rhythm, normal S1/S2, no S3/S4, no murmur. No pretibial or periankle edema.  No carotid bruit.   Abdomen: Soft, nontender, no distention.  Neurologic: Alert and oriented.  Psych: Normal affect. Skin: Normal. Musculoskeletal: No gross deformities.    ECG: Reviewed above under Subjective   Labs: Lab Results  Component Value Date/Time   K 4.2 04/27/2017 08:57 AM   BUN 24 04/27/2017 08:57 AM   CREATININE 1.14 04/27/2017 08:57 AM   ALT 16 (L) 03/29/2017 07:08 PM   HGB 11.4 (L) 04/27/2017 08:57 AM     Lipids: Lab Results  Component Value Date/Time   LDLCALC UNABLE TO CALCULATE IF TRIGLYCERIDE OVER 400 mg/dL 03/30/2017 03:08 AM   CHOL 177 03/30/2017 03:08 AM   TRIG 793 (H) 03/30/2017 03:08 AM   HDL 25 (L) 03/30/2017 03:08 AM       ASSESSMENT AND  PLAN: 1.  Coronary artery disease: History of non-STEMI and three-vessel CABG.  Symptomatically stable.  Continue aspirin, losartan, and Crestor 5 mg daily.  He did not tolerate atorvastatin 80 mg.  2.  Hyperlipidemia: I will obtain a copy of lipids from PCP.  Continue Crestor 5 mg daily.  He did not tolerate atorvastatin 80 mg.  3.  Hypertension: Blood pressure is mildly elevated.  No changes for now.    Disposition: Follow up 1 year   Kate Sable, M.D., F.A.C.C.

## 2018-06-19 NOTE — Patient Instructions (Addendum)
Your physician wants you to follow-up in: 1 YEAR WITH DR KONESWARAN You will receive a reminder letter in the mail two months in advance. If you don't receive a letter, please call our office to schedule the follow-up appointment.  Your physician recommends that you continue on your current medications as directed. Please refer to the Current Medication list given to you today.  Thank you for choosing [facility]!!    

## 2018-11-15 IMAGING — DX DG CHEST 1V PORT
1 series · 1 of 1 positions shown · non-contrast
Comparison: Chest radiograph dated 03/28/2017

CLINICAL DATA: 75-year-old male with chest pain.

EXAM:
PORTABLE CHEST 1 VIEW

[chest ap]
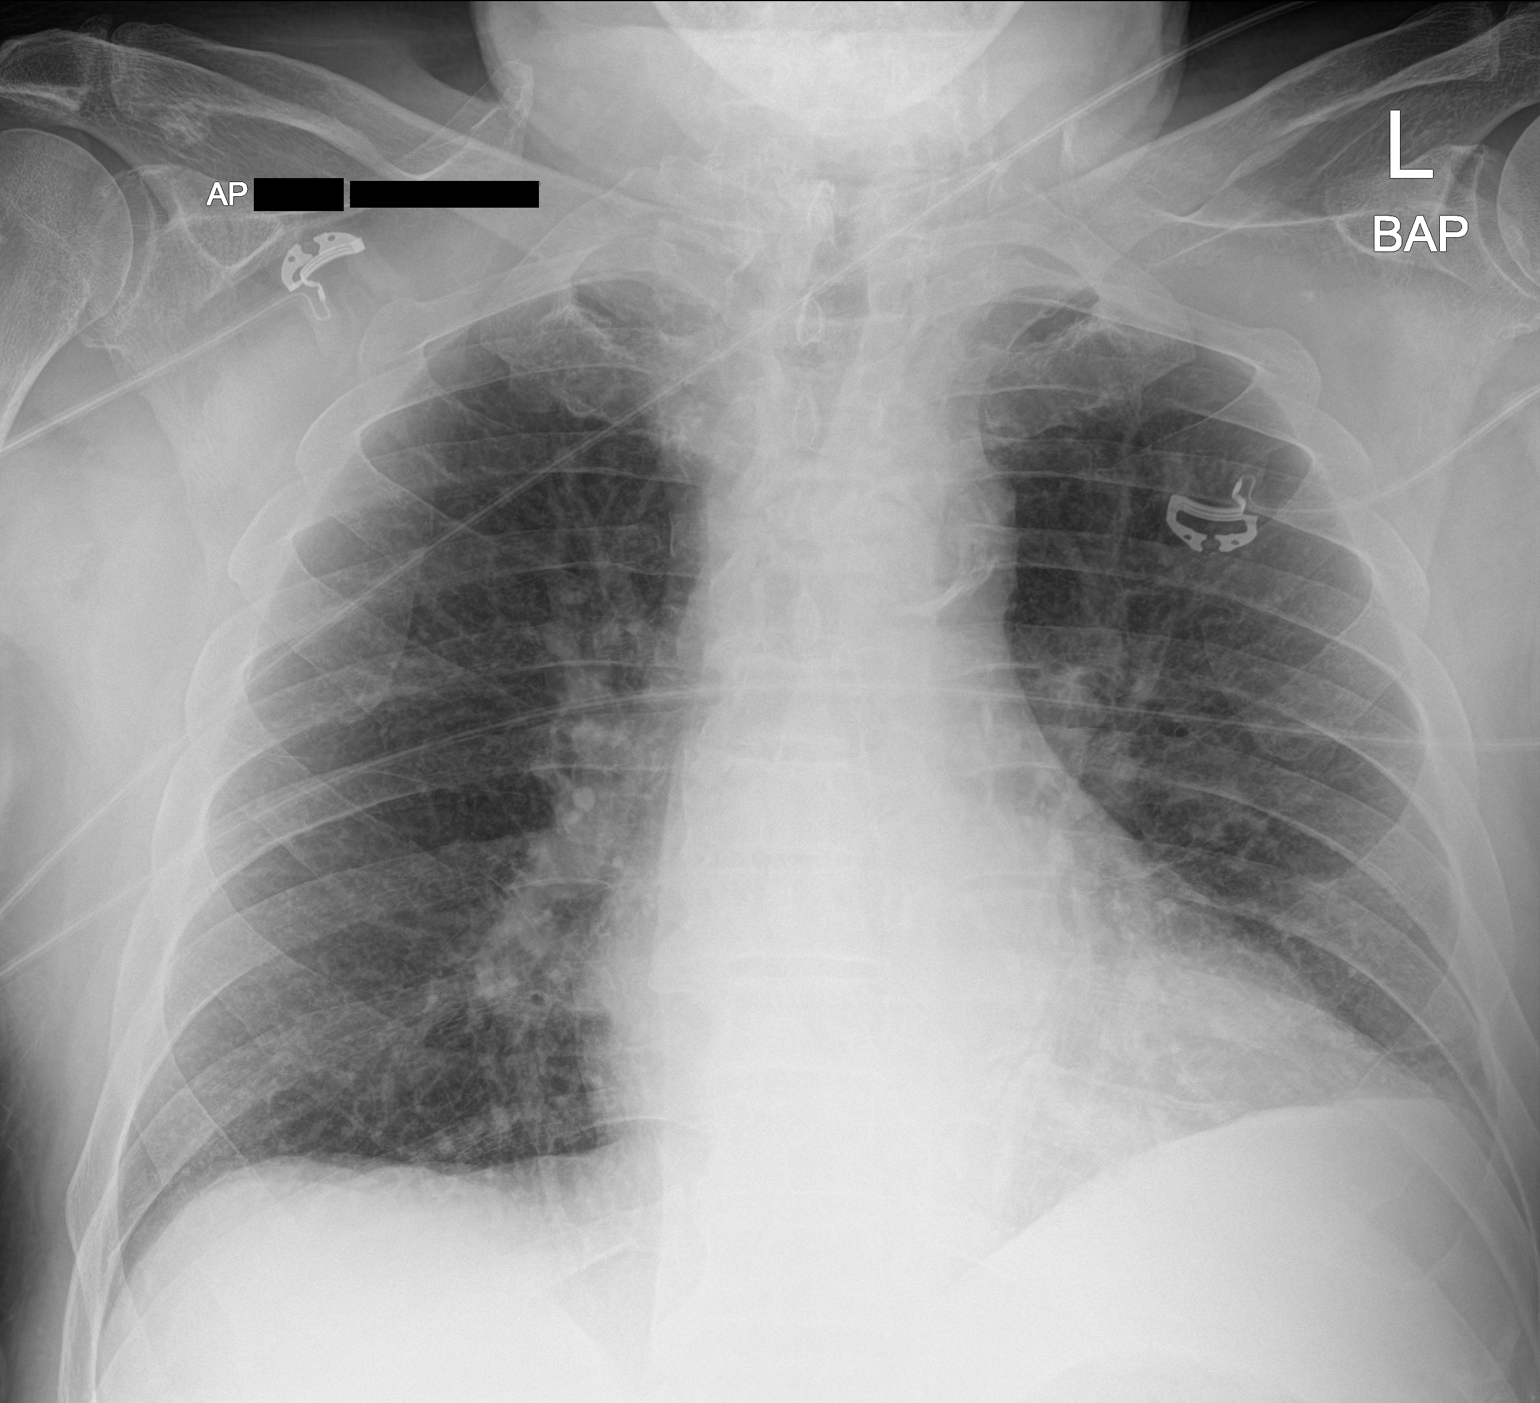

[1 of 1 positions shown; findings below may reference images not displayed]

FINDINGS: The lungs are clear. There is no pleural effusion or pneumothorax.
There is borderline cardiomegaly. There is mildly tortuous thoracic
aorta with atherosclerotic calcification. No acute osseous
pathology.
IMPRESSION: 1. No acute cardiopulmonary process.
2. Borderline cardiomegaly.

## 2018-11-16 IMAGING — CR DG CHEST 1V PORT
1 series · 1 of 1 positions shown · non-contrast
Comparison: 03/29/2017

CLINICAL DATA: Status post CABG

EXAM:
PORTABLE CHEST 1 VIEW

[AP]
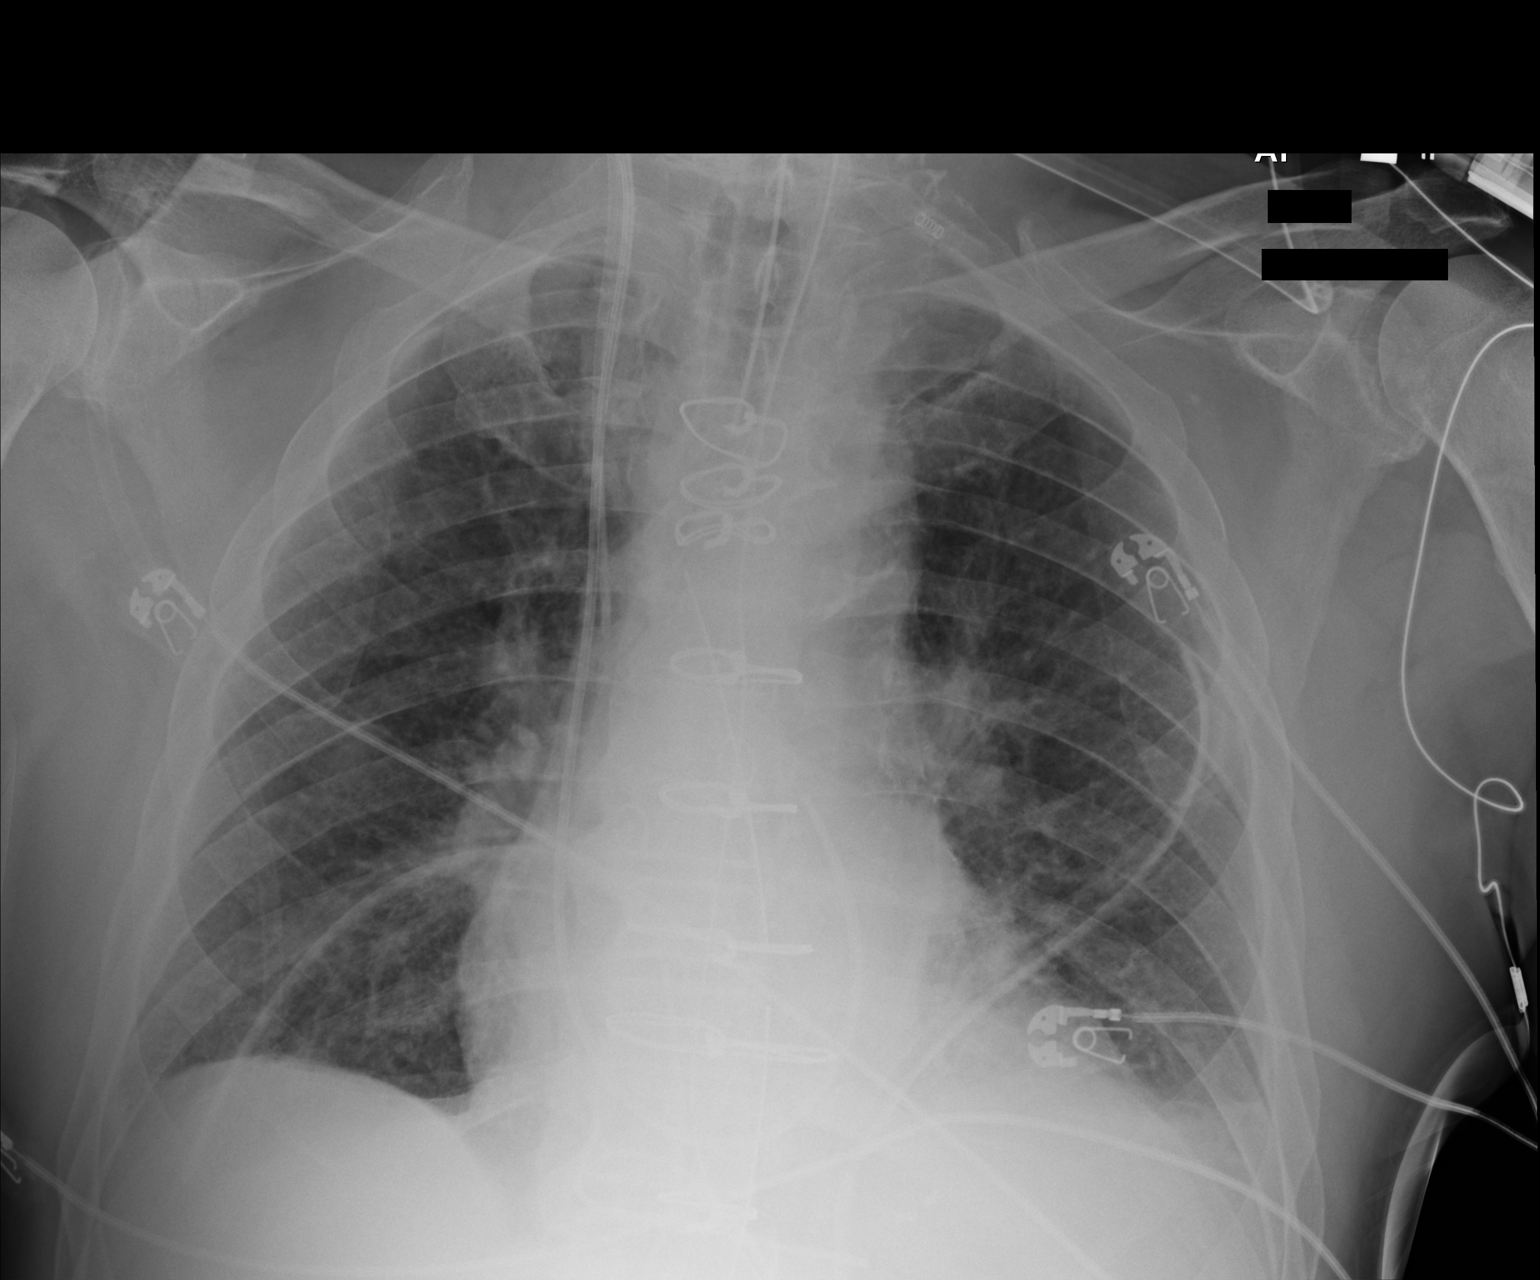

[1 of 1 positions shown; findings below may reference images not displayed]

FINDINGS: Interval post sternotomy changes. Endotracheal tube tip is about
cm superior to the carina. Esophageal tube tip is below the
diaphragm but not included. Left-sided and central or mediastinal
drainage catheters. Right-sided central venous catheter tip overlies
the SVC. Right Swan-Ganz catheter tip overlies the pulmonary trunk.
Mild atelectasis at the left base with tiny effusion. Negative for a
pneumothorax.
IMPRESSION: 1. Interim postsurgical changes of the mediastinum with placement of
support lines and tubes as above. Negative for a pneumothorax
2. Probable tiny left effusion with left basilar atelectasis.

## 2018-11-17 IMAGING — CR DG CHEST 1V PORT
1 series · 1 of 1 positions shown · non-contrast
Comparison: One-view chest x-ray 03/30/2017

CLINICAL DATA: CABG yesterday.

EXAM:
PORTABLE CHEST 1 VIEW

[AP]
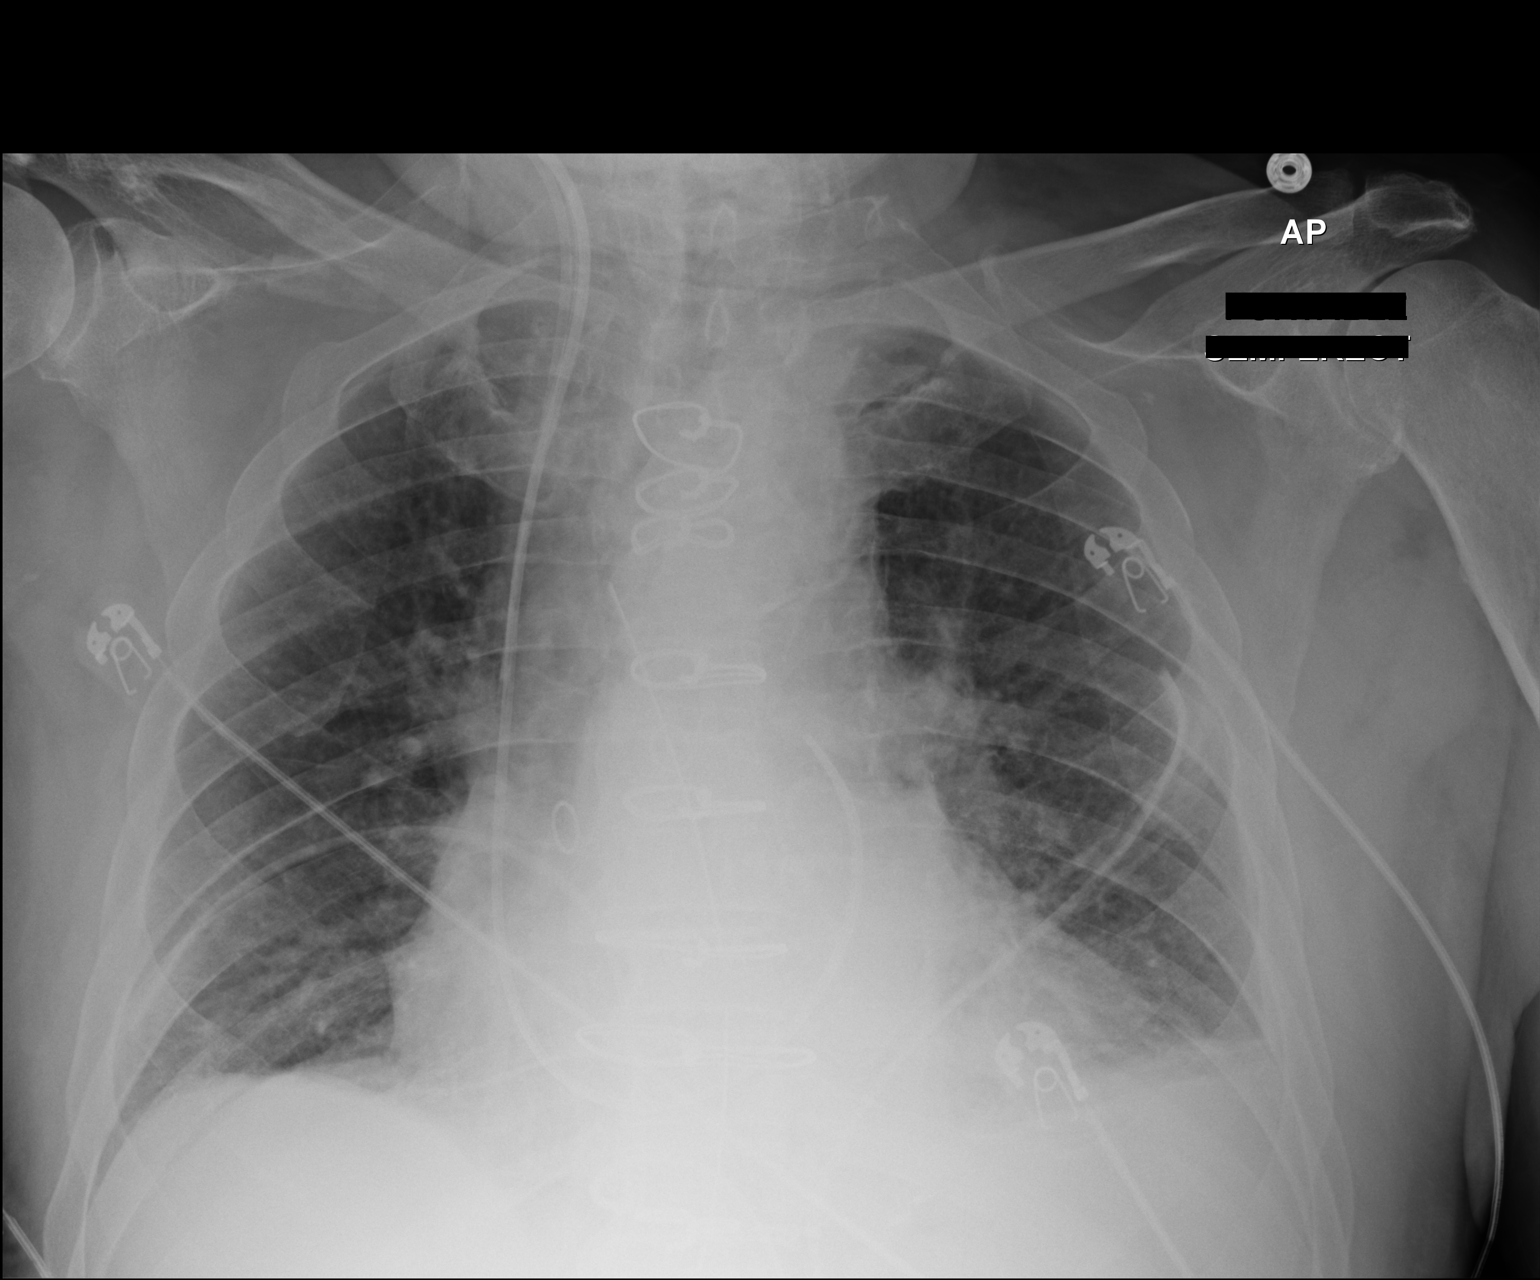

[1 of 1 positions shown; findings below may reference images not displayed]

FINDINGS: The patient has been extubated. The tip the Swan-Ganz catheter is in
the main pulmonary outflow tract. A left-sided chest tube and
mediastinal drains are in place. There is no pneumothorax or
pneumomediastinum. Lung volumes have decreased. Left greater than
right pleural effusions have increased slightly with associated
atelectasis. Mild pulmonary vascular congestion is slightly worse.
IMPRESSION: 1. Interval extubation.
2. Remaining support apparatus is stable without pneumothorax or
pneumomediastinum.
3. Slight decreased lung volumes with minimal increased pleural
effusions and atelectasis, left greater than right.
4. Slight increased pulmonary vascular congestion.

## 2018-11-18 IMAGING — CR DG CHEST 1V PORT
1 series · 1 of 1 positions shown · non-contrast
Comparison: One-view chest x-ray 03/31/2017.

CLINICAL DATA: CABG 2 days ago.

EXAM:
PORTABLE CHEST 1 VIEW

[AP]
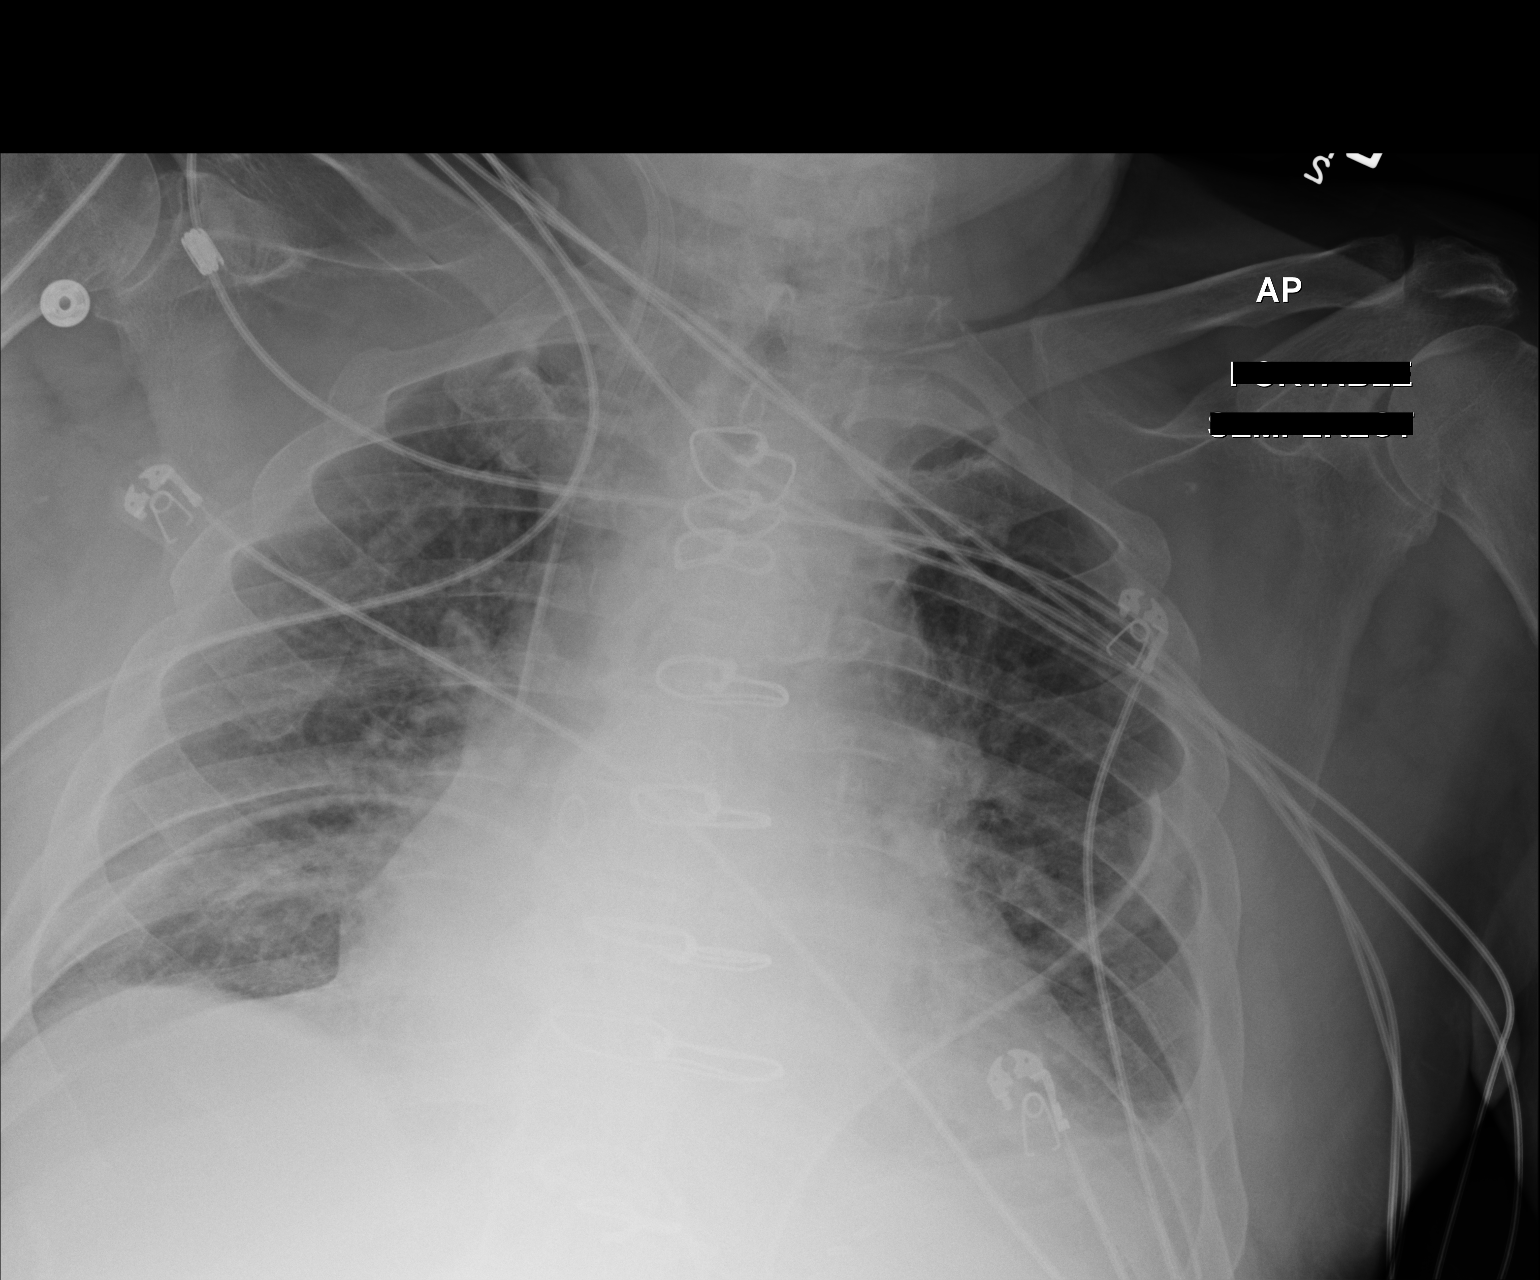

[1 of 1 positions shown; findings below may reference images not displayed]

FINDINGS: The heart is enlarged, exaggerated by low lung volumes.

The Swan-Ganz catheter was removed. A right IJ sheath remains. The
mediastinal drain is no longer visualized. Left-sided chest tube
remains.

There is no pneumothorax. Left pleural effusion is stable. Bibasilar
airspace opacities are stable, likely reflecting atelectasis. Lung
volumes have decreased slightly.
IMPRESSION: 1. Interval removal of Swan-Ganz catheter. The right IJ sheath
remains.
2. Interval removal of mediastinal drain.
3. Stable left chest tube.
4. Slight decrease in lung volumes without significant change in
left pleural effusion or bibasilar atelectasis.

## 2018-11-19 IMAGING — CR DG CHEST 1V PORT
1 series · 1 of 1 positions shown · non-contrast
Comparison: April 01, 2017

CLINICAL DATA: Chest pain

EXAM:
PORTABLE CHEST 1 VIEW

[AP]
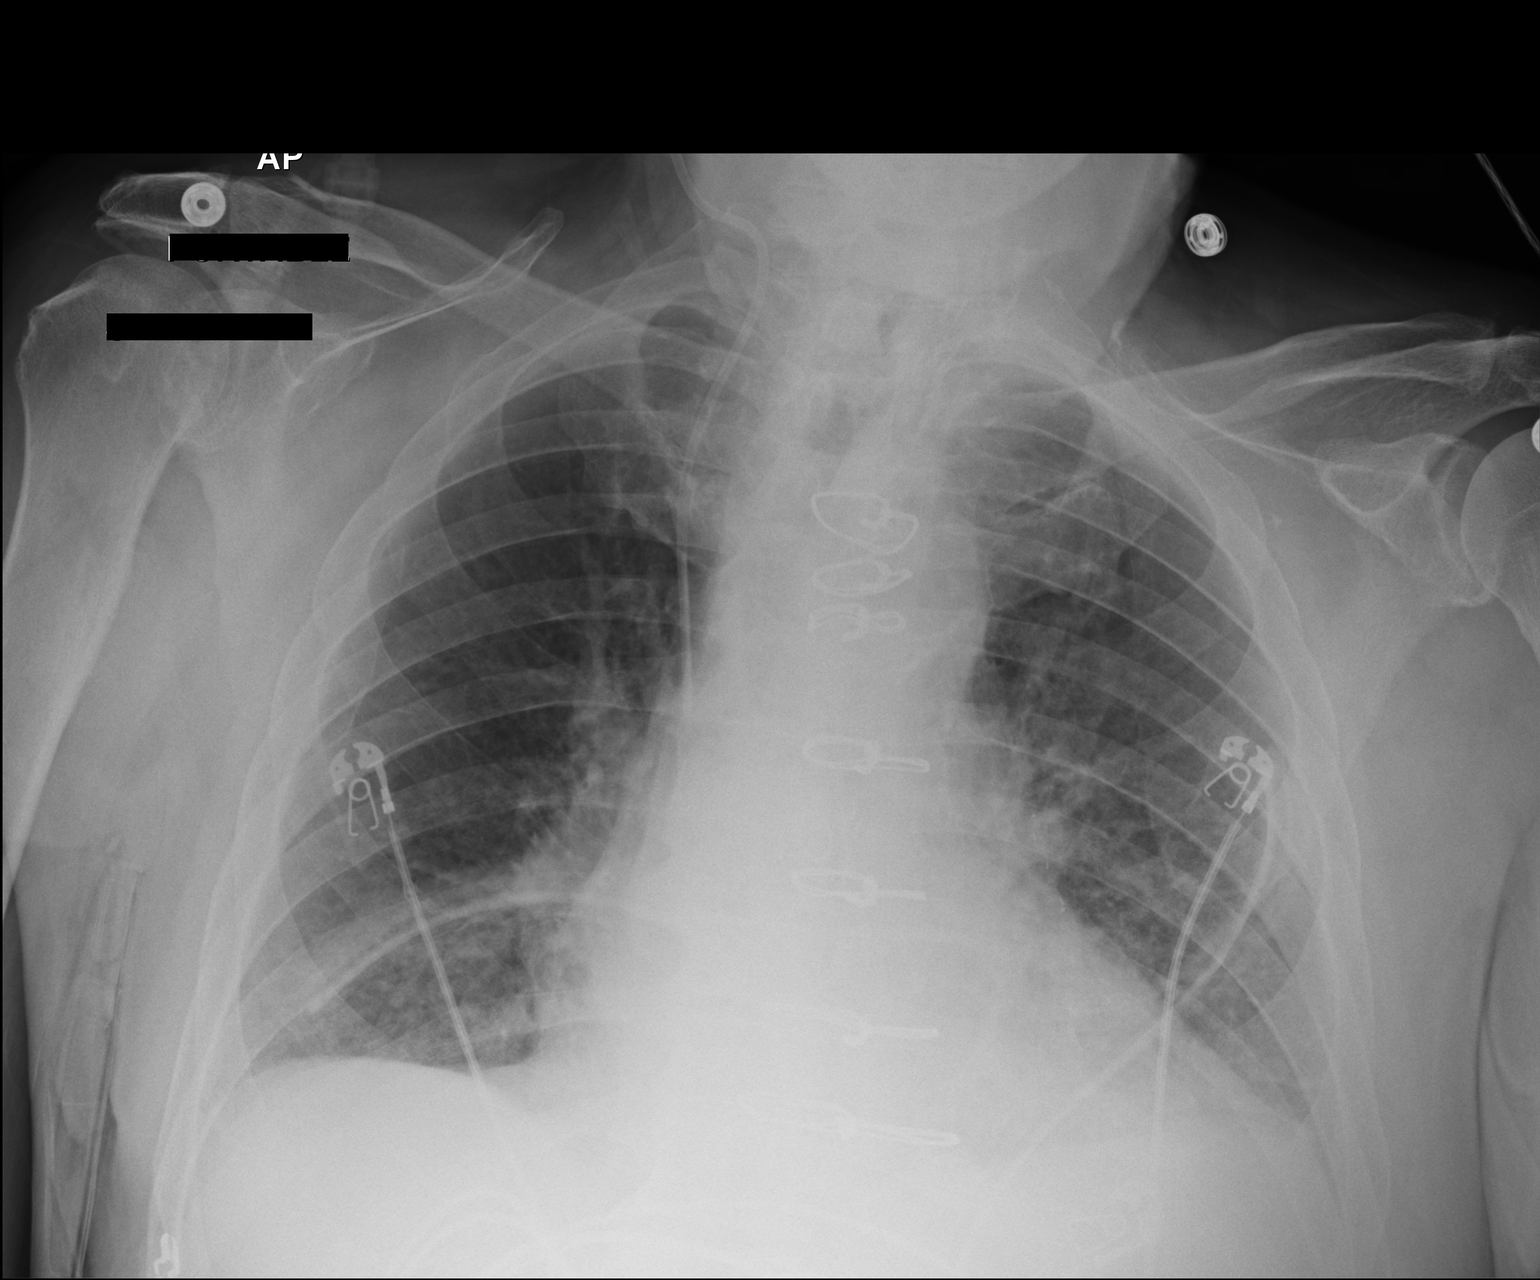

[1 of 1 positions shown; findings below may reference images not displayed]

FINDINGS: There is a chest tube on each side, unchanged in position. Central
catheter tip is in the superior vena cava. No evident pneumothorax.
There is a minimal left pleural effusion with mild bibasilar
atelectasis. There is no frank consolidation. Heart is mildly
enlarged with pulmonary vascularity within normal limits. Patient is
status post coronary artery bypass grafting. There is aortic
atherosclerosis. No evident bone lesions.
IMPRESSION: Tube and catheter positions as described without pneumothorax.
Rather minimal left pleural effusion with patchy bibasilar
atelectasis. Stable cardiac prominence. There is aortic
atherosclerosis.

Aortic Atherosclerosis (S2EED-LQZ.Z).

## 2019-09-10 NOTE — Progress Notes (Addendum)
Cardiology Office Note  Date: 09/10/2019   ID: Bryce Powell, DOB 11/29/1940, MRN [id number]  PCP:  Candi Leash, PA-C  Cardiologist:  Kate Sable, MD Electrophysiologist:  None   Chief Complaint: Follow-up CAD, HTN, HLD, type II DM, tobacco abuse  History of Present Illness: Bryce Powell is a 79 y.o. male with a history of CAD, HTN, HLD, type II DM, tobacco abuse.  Previous history of NSTEMI with three-vessel CABG 2018 with LIMA-LAD, SVG-PLA, SVG-first OM.  Postop atrial fibrillation.  Did not tolerate atorvastatin and was changed to Crestor.  Last saw Dr. Bronson Ing 06/19/2018.  Had been active outdoors cutting down trees and chopping wood.  From a cardiac standpoint he was symptomatically stable.  He continued on aspirin, losartan, Crestor.  His blood pressure was mildly elevated.  Patient states he has been doing well from a cardiac standpoint.  States he has been working a lot outside cutting trees and chopping wood.  States he is very active outdoors.  He denies any progressive anginal or exertional symptoms, palpitations or arrhythmias, orthostatic symptoms, CVA or TIA-like symptoms, bleeding issues, claudication-like symptoms, DVT or PE-like symptoms, or lower extremity edema.  He continues to smoke.  His blood pressure is elevated on arrival today.  He states she was late in taking his blood pressure medication this morning.  Recheck and right arm blood pressure had decreased to 160/85.  Glucose has not been well controlled recently.   Past Medical History:  Diagnosis Date  . Current smoker   . Diabetes ([place])   . HTN (hypertension)   . PAF (paroxysmal atrial fibrillation) ([place])    post CABG  . 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 Tobacco abuse     Past Surgical History:  Procedure Laterality Date  . CORONARY ARTERY BYPASS GRAFT N/A 03/30/2017   Procedure: CORONARY ARTERY  BYPASS GRAFTING (CABG) x Three, using left internal mammary artery and right leg greater saphenous vein harvested endoscopically;  Surgeon: Rexene Alberts, MD;  Location: [facility];  Service: Open Heart Surgery;  Laterality: N/A;  . LEFT HEART CATH AND CORONARY ANGIOGRAPHY N/A 03/29/2017   Procedure: LEFT HEART CATH AND CORONARY ANGIOGRAPHY;  Surgeon: Lorretta Harp, MD;  Location: [facility];  Service: Cardiovascular;  Laterality: N/A;  . TEE WITHOUT CARDIOVERSION N/A 03/30/2017   Procedure: TRANSESOPHAGEAL ECHOCARDIOGRAM (TEE);  Surgeon: Rexene Alberts, MD;  Location: [facility];  Service: Open Heart Surgery;  Laterality: N/A;    Current Outpatient Medications  Medication Sig Dispense Refill  . acetaminophen (TYLENOL) 500 MG tablet Take 2 tablets (1,000 mg total) by mouth every 6 (six) hours. 30 tablet 0  . aspirin EC 81 MG tablet Take 81 mg daily by mouth.    . colchicine 0.6 MG tablet Take 1 tablet by mouth 2 (two) times daily as needed.    Marland Kitchen glipiZIDE (GLUCOTROL) 5 MG tablet Take 5 mg daily by mouth.    . losartan (COZAAR) 50 MG tablet Take 50 mg daily by mouth.    . metFORMIN (GLUCOPHAGE) 1000 MG tablet Take 1,000 mg 2 (two) times daily by mouth.    . nitroGLYCERIN (NITROSTAT) 0.4 MG SL tablet Place 1 tablet (0.4 mg total) under the tongue every 5 (five) minutes as needed for chest pain. 25 tablet 3  . rosuvastatin (CRESTOR) 5 MG tablet TAKE 1 TABLET  BY MOUTH  DAILY 90 tablet 1   No current facility-administered medications for this visit.   Allergies:  Atorvastatin   Social History: The patient  reports that he has been smoking cigars. He has a 120.00 pack-year smoking history. He has never used smokeless tobacco. He reports that he does not drink alcohol or use drugs.   Family History: The patient's family history includes Diabetes in his mother; Heart attack in his father and mother.   ROS:  Please see the history of present illness. Otherwise, complete review of systems is  positive for none.  All other systems are reviewed and negative.   Physical Exam: VS:  There were no vitals taken for this visit., BMI There is no height or weight on file to calculate BMI.  Wt Readings from Last 3 Encounters:  06/19/18 213 lb 6.4 oz (96.8 kg)  05/06/18 212 lb (96.2 kg)  04/15/18 215 lb 12.8 oz (97.9 kg)    General: Patient appears comfortable at rest. Neck: Supple, no elevated JVP or carotid bruits, no thyromegaly. Lungs: Clear to auscultation, nonlabored breathing at rest. Cardiac: Regular rate and rhythm, no S3 or significant systolic murmur, no pericardial rub. Extremities: No pitting edema, distal pulses 2+. Skin: Warm and dry. Musculoskeletal: No kyphosis. Neuropsychiatric: Alert and oriented x3, affect grossly appropriate.  ECG:  An ECG dated 09/11/2019 was personally reviewed today and demonstrated:  Normal sinus rhythm rate of 81, right bundle branch block and right axis possible right ventricular hypertrophy, consider pulmonary disease.  Recent Labwork: Recent lab work from PCP office on 08/26/2019 showed a hemoglobin A1c of 7.3, H&H 12.8 and 38, glucose 133, GFR 84, creatinine 0.83, total cholesterol 186, triglyceride 200, HDL 31, LDL of 119. No results found for requested labs within last 8760 hours.     Component Value Date/Time   CHOL 177 03/30/2017 0308   TRIG 793 (H) 03/30/2017 0308   HDL 25 (L) 03/30/2017 0308   CHOLHDL 7.1 03/30/2017 0308   VLDL UNABLE TO CALCULATE IF TRIGLYCERIDE OVER 400 mg/dL 03/30/2017 0308   LDLCALC UNABLE TO CALCULATE IF TRIGLYCERIDE OVER 400 mg/dL 03/30/2017 0308    Other Studies Reviewed Today:  TEE 03/30/2017  Aortic valve: The valve is trileaflet. Mild valve thickening present. No  stenosis. No regurgitation.   Mitral valve: Mild leaflet thickening is present. Predominantly  posterior mild mitral annular calcification. Trace regurgitation.   Right ventricle: Normal cavity size, wall thickness and ejection    fraction. In the post-bypass study right ventricular systolic function  appeared normal.   Tricuspid valve: Trace regurgitation. The tricuspid valve regurgitation  jet is central.   Pulmonic valve: Trace regurgitation.     Cardiac catheterization 03/29/2017   Ost LM to Mid LM lesion is 90% stenosed.  Ost Cx lesion is 90% stenosed.  Ost LAD lesion is 70% stenosed.  Prox LAD lesion is 90% stenosed.  Prox Cx to Mid Cx lesion is 95% stenosed.  Prox RCA to Mid RCA lesion is 95% stenosed.  The left ventricular systolic function is normal.  LV end diastolic pressure is normal.  The left ventricular ejection fraction is 50-55% by visual estimate.     Assessment and Plan:  1. CAD in native artery   2. Essential hypertension   3. Hyperlipidemia LDL goal <70   4. Tobacco abuse    1. CAD in native artery Patient denies any recent progressive anginal or exertional symptoms.  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  Continue aspirin 81 mg, sublingual nitroglycerin 0.4 mg sublingual.  2. Essential hypertension Patient was hypertensive on arrival at 180/80.  Repeat check in right arm was 160/84.  Patient states he was late in taking his antihypertensive medications this morning.  States he took approximately 1/2-hour before arriving to clinic.  Advised patient to check his blood pressures periodically to see if the numbers are better.  Advised him that his blood pressure needs to be around 130/80 or less and to let us know if he has sustained blood pressures above that range.  Continue amlodipine 5 mg p.o. daily, losartan 50 mg p.o. daily.  3. Hyperlipidemia LDL goal <70 Patient has no recent lipid lab work.  We will attempt to obtain lab work from PCP.  Continue Crestor 5 mg p.o. daily.  Recent LDL 119.  May need to increase Crestor at next visit to 10 mg  4. Tobacco abuse Patient continues to smoke daily.  Highly advised patient he  needs to smoking due to its contribution to lung disease, heart disease, exacerbating hypertension.  Patient verbalizes understanding.  Medication Adjustments/Labs and Tests Ordered: Current medicines are reviewed at length with the patient today.  Concerns regarding medicines are outlined above.   Disposition: Follow-up with Dr. Bronson Ing or APP 1 year  Signed, Levell July, NP 09/10/2019 9:20 PM    [facility] at [address] [place], [place], [place] [address] Phone: [phone]; Fax: [phone]

## 2019-09-11 ENCOUNTER — Encounter: Payer: Self-pay | Admitting: *Deleted

## 2019-09-11 ENCOUNTER — Ambulatory Visit: Payer: Medicare Other | Admitting: Family Medicine

## 2019-09-11 ENCOUNTER — Other Ambulatory Visit: Payer: Self-pay

## 2019-09-11 ENCOUNTER — Encounter: Payer: Self-pay | Admitting: Family Medicine

## 2019-09-11 VITALS — BP 180/80 | HR 76 | Ht 72.0 in | Wt 201.6 lb

## 2019-09-11 DIAGNOSIS — I2510 Atherosclerotic heart disease of native coronary artery without angina pectoris: Secondary | ICD-10-CM

## 2019-09-11 DIAGNOSIS — I10 Essential (primary) hypertension: Secondary | ICD-10-CM | POA: Diagnosis not present

## 2019-09-11 DIAGNOSIS — Z720 Tobacco use: Secondary | ICD-10-CM

## 2019-09-11 DIAGNOSIS — E785 Hyperlipidemia, unspecified: Secondary | ICD-10-CM

## 2019-09-11 NOTE — Patient Instructions (Signed)
Medication Instructions:   Your physician recommends that you continue on your current medications as directed. Please refer to the Current Medication list given to you today.  Labwork:  NONE  Testing/Procedures:  NONE  Follow-Up:  Your physician recommends that you schedule a follow-up appointment in: 1 year (office). You will receive a reminder letter in the mail in about 10 months reminding you to call and schedule your appointment. If you don't receive this letter, please contact our office.  Any Other Special Instructions Will Be Listed Below (If Applicable).  Please monitor your blood pressure at home. If your readings are staying consistently above 130/80, contact our office  Please stop smoking.  If you need a refill on your cardiac medications before your next appointment, please call your pharmacy.

## 2020-07-23 ENCOUNTER — Other Ambulatory Visit: Payer: Self-pay

## 2020-07-23 ENCOUNTER — Emergency Department (HOSPITAL_COMMUNITY): Payer: Medicare Other

## 2020-07-23 ENCOUNTER — Emergency Department (HOSPITAL_COMMUNITY)
Admission: EM | Admit: 2020-07-23 | Discharge: 2020-07-23 | Disposition: A | Discharge status: Home / Self Care | Payer: Medicare Other | Attending: Emergency Medicine | Admitting: Emergency Medicine

## 2020-07-23 ENCOUNTER — Encounter (HOSPITAL_COMMUNITY): Payer: Self-pay | Admitting: *Deleted

## 2020-07-23 DIAGNOSIS — M4726 Other spondylosis with radiculopathy, lumbar region: Secondary | ICD-10-CM

## 2020-07-23 DIAGNOSIS — I10 Essential (primary) hypertension: Secondary | ICD-10-CM | POA: Diagnosis not present

## 2020-07-23 DIAGNOSIS — Z7982 Long term (current) use of aspirin: Secondary | ICD-10-CM | POA: Insufficient documentation

## 2020-07-23 DIAGNOSIS — M5442 Lumbago with sciatica, left side: Secondary | ICD-10-CM | POA: Diagnosis not present

## 2020-07-23 DIAGNOSIS — Z7984 Long term (current) use of oral hypoglycemic drugs: Secondary | ICD-10-CM | POA: Insufficient documentation

## 2020-07-23 DIAGNOSIS — E109 Type 1 diabetes mellitus without complications: Secondary | ICD-10-CM | POA: Insufficient documentation

## 2020-07-23 DIAGNOSIS — M5441 Lumbago with sciatica, right side: Secondary | ICD-10-CM | POA: Diagnosis not present

## 2020-07-23 DIAGNOSIS — M25552 Pain in left hip: Secondary | ICD-10-CM | POA: Diagnosis present

## 2020-07-23 DIAGNOSIS — M5430 Sciatica, unspecified side: Secondary | ICD-10-CM

## 2020-07-23 DIAGNOSIS — Z79899 Other long term (current) drug therapy: Secondary | ICD-10-CM | POA: Insufficient documentation

## 2020-07-23 DIAGNOSIS — I480 Paroxysmal atrial fibrillation: Secondary | ICD-10-CM | POA: Diagnosis not present

## 2020-07-23 DIAGNOSIS — Z951 Presence of aortocoronary bypass graft: Secondary | ICD-10-CM | POA: Insufficient documentation

## 2020-07-23 DIAGNOSIS — F17290 Nicotine dependence, other tobacco product, uncomplicated: Secondary | ICD-10-CM | POA: Diagnosis not present

## 2020-07-23 MED ORDER — LIDOCAINE 5 % EX PTCH
1.0000 | MEDICATED_PATCH | CUTANEOUS | Status: DC
  Administered 2020-07-23: 1 via TRANSDERMAL
  Filled 2020-07-23: qty 1

## 2020-07-23 MED ORDER — TRAMADOL HCL 50 MG PO TABS
50.0000 mg | ORAL_TABLET | Freq: Once | ORAL | Status: AC
  Administered 2020-07-23: 50 mg via ORAL
  Filled 2020-07-23: qty 1

## 2020-07-23 MED ORDER — LIDOCAINE 5 % EX PTCH: 1.0000 | MEDICATED_PATCH | CUTANEOUS | 0 refills | Status: DC

## 2020-07-23 MED ORDER — TRAMADOL HCL 50 MG PO TABS: 50.0000 mg | ORAL_TABLET | Freq: Four times a day (QID) | ORAL | 0 refills | Status: DC | PRN

## 2020-07-23 NOTE — Discharge Instructions (Signed)
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 You may take the medicines prescribed for pain relief.  Call your primary provider for further management.  You may also benefit from seeing an orthopedic MD to which you have been referred.

## 2020-07-23 NOTE — ED Triage Notes (Signed)
Pain in both hips , legs and feet

## 2020-07-23 NOTE — ED Provider Notes (Signed)
[facility] EMERGENCY DEPARTMENT Provider Note   CSN: [id number] Arrival date & time: 07/23/20  1735     History Chief Complaint  Patient presents with  . Leg Pain    Bryce Powell is a 80 y.o. male with a history of diabetes, hypertension, paroxysmal A. fib and history of gout presenting with a several month history of on again off again bilateral hip pain, left greater than right with radiation of pain all the way to the arches of his feet.  He points to his left posterior now he describes an aching pain which is sometimes worse with movement, but also states he can be laying in bed and after 30 minutes or so his pain escalates and he has to reposition in order to get comfortable.  He denies any injuries or falls.  Also denies fevers or chills, denies dysuria or flank pain.  He has been seen by his primary provider for this and has been prescribed prednisone and Ultram which has been modestly effective.  He has run out of both of these medications.  HPI     Past Medical History:  Diagnosis Date  . Current smoker   . Diabetes ([place])   . HTN (hypertension)   . PAF (paroxysmal atrial fibrillation) ([place])    post CABG  . 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 Tobacco abuse     Patient Active Problem List   Diagnosis Date Noted  . 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 Tobacco abuse   . NSTEMI (non-ST elevated myocardial infarction) ([place]) 03/28/2017  . HTN (hypertension) 03/28/2017  . Diabetes 1.5, managed as type 1 ([place]) 03/28/2017  . Angina at rest [facility]) 03/28/2017    Past Surgical History:  Procedure Laterality Date  . CORONARY ARTERY BYPASS GRAFT N/A 03/30/2017   Procedure: CORONARY ARTERY BYPASS GRAFTING (CABG) x Three, using left internal mammary artery and right leg greater saphenous vein harvested endoscopically;  Surgeon: Rexene Alberts, MD;  Location: [place];  Service: Open Heart Surgery;   Laterality: N/A;  . LEFT HEART CATH AND CORONARY ANGIOGRAPHY N/A 03/29/2017   Procedure: LEFT HEART CATH AND CORONARY ANGIOGRAPHY;  Surgeon: Lorretta Harp, MD;  Location: [place] CV LAB;  Service: Cardiovascular;  Laterality: N/A;  . TEE WITHOUT CARDIOVERSION N/A 03/30/2017   Procedure: TRANSESOPHAGEAL ECHOCARDIOGRAM (TEE);  Surgeon: Rexene Alberts, MD;  Location: [place];  Service: Open Heart Surgery;  Laterality: N/A;       Family History  Problem Relation Age of Onset  . Diabetes Mother   . Heart attack Mother   . Heart attack Father     Social History   Tobacco Use  . Smoking status: Current Some Day Smoker    Packs/day: 2.00    Years: 60.00    Pack years: 120.00    Types: Cigars    Last attempt to quit: 03/28/2017    Years since quitting: 3.3  . Smokeless tobacco: Never Used  . Tobacco comment: Pt states , "I will probably quit now."  Vaping Use  . Vaping Use: Never used  Substance Use Topics  . Alcohol use: No  . Drug use: No    Home Medications Prior to Admission medications   Medication Sig Start Date End Date Taking? Authorizing Provider  lidocaine (LIDODERM) 5 % Place 1 patch onto the skin daily. Remove &  Discard patch within 12 hours or as directed by MD 07/23/20  Yes Idol, Almyra Free, PA-C  traMADol (ULTRAM) 50 MG tablet Take 1 tablet (50 mg total) by mouth every 6 (six) hours as needed. 07/23/20  Yes Idol, Almyra Free, PA-C  acetaminophen (TYLENOL) 500 MG tablet Take 2 tablets (1,000 mg total) by mouth every 6 (six) hours. 04/04/17   Elgie Collard, PA-C  amLODipine (NORVASC) 5 MG tablet Take 5 mg by mouth daily. 07/21/19   [provider]  aspirin EC 81 MG tablet Take 81 mg daily by mouth.    [provider]  colchicine 0.6 MG tablet Take 1 tablet by mouth 2 (two) times daily as needed. 06/17/18   [provider]  glipiZIDE (GLUCOTROL XL) 10 MG 24 hr tablet Take 10 mg by mouth daily. 07/21/19   [provider]  losartan (COZAAR) 50  MG tablet Take 50 mg daily by mouth.    [provider]  metFORMIN (GLUCOPHAGE) 1000 MG tablet Take 1,000 mg 2 (two) times daily by mouth. 03/01/17   [provider]  nitroGLYCERIN (NITROSTAT) 0.4 MG SL tablet Place 1 tablet (0.4 mg total) under the tongue every 5 (five) minutes as needed for chest pain. 06/12/17 09/11/19  Herminio Commons, MD  rosuvastatin (CRESTOR) 5 MG tablet TAKE 1 TABLET BY MOUTH  DAILY 05/27/18   Herminio Commons, MD    Allergies    Atorvastatin  Review of Systems   Review of Systems  Constitutional: Negative for fever.  Respiratory: Negative for shortness of breath.   Cardiovascular: Negative for chest pain and leg swelling.  Gastrointestinal: Negative for abdominal distention, abdominal pain and constipation.  Genitourinary: Negative for difficulty urinating, dysuria, flank pain, frequency and urgency.  Musculoskeletal: Positive for arthralgias and back pain. Negative for gait problem and joint swelling.  Skin: Negative for rash.  Neurological: Negative for weakness and numbness.    Physical Exam Updated Vital Signs BP (!) 185/90 (BP Location: Right Arm) Comment: pt reports he forgot to take his BP med this am   Pulse 70   Temp 98.3 F (36.8 C) (Oral)   Resp 17   SpO2 96%   Physical Exam Vitals and nursing note reviewed.  Constitutional:      Appearance: He is well-developed.  HENT:     Head: Normocephalic.  Eyes:     Conjunctiva/sclera: Conjunctivae normal.  Cardiovascular:     Rate and Rhythm: Normal rate.     Pulses:          Dorsalis pedis pulses are 2+ on the right side and 2+ on the left side.     Comments: Pedal pulses normal. Pulmonary:     Effort: Pulmonary effort is normal.  Abdominal:     General: Bowel sounds are normal. There is no distension.     Palpations: Abdomen is soft. There is no mass.  Musculoskeletal:        General: Normal range of motion.     Cervical back: Normal range of motion and neck  supple.     Lumbar back: Tenderness present. No swelling, edema or spasms.     Comments: TTP left SI joint.   Skin:    General: Skin is warm and dry.  Neurological:     Mental Status: He is alert.     Sensory: No sensory deficit.     Motor: No tremor or atrophy.     Gait: Gait normal.     Deep Tendon Reflexes:  Reflex Scores:      Patellar reflexes are 2+ on the right side and 2+ on the left side.      Achilles reflexes are 2+ on the right side and 2+ on the left side.    Comments: No strength deficit noted in hip and knee flexor and extensor muscle groups.  Ankle flexion and extension intact.     ED Results / Procedures / Treatments   Labs (all labs ordered are listed, but only abnormal results are displayed) Labs Reviewed - No data to display  EKG None  Radiology DG Lumbar Spine Complete  Result Date: 07/23/2020 CLINICAL DATA:  Bilateral lower extremity radiculopathy EXAM: LUMBAR SPINE - COMPLETE 4+ VIEW COMPARISON:  None. FINDINGS: Frontal, bilateral oblique, lateral views of the lumbar spine are obtained. There are 5 non-rib-bearing lumbar type vertebral bodies in anatomic alignment. No acute displaced fractures. There is diffuse multilevel spondylosis greatest at the thoracolumbar and lumbosacral junctions. Moderate facet hypertrophic changes greatest at L4-5 and L5-S1. Sacroiliac joints are normal. Extensive atherosclerosis of the abdominal aorta and its branches. IMPRESSION: 1. Multilevel lumbar spondylosis and facet hypertrophy. Changes are greatest at the lumbosacral junction. 2. No acute fracture. 3. Atherosclerosis. Electronically Signed   By: Randa Ngo M.D.   On: 07/23/2020 21:45   DG Hips Bilat W or Wo Pelvis 3-4 Views  Result Date: 07/23/2020 CLINICAL DATA:  Bilateral hip pain, lower extremity radiculopathy EXAM: DG HIP (WITH OR WITHOUT PELVIS) 3-4V BILAT COMPARISON:  None. FINDINGS: Frontal view of the pelvis as well as frontal and frogleg lateral views of both  hips are obtained. No acute fracture, subluxation, or dislocation within either hip. Mild symmetrical bilateral hip osteoarthritis. Prominent spondylosis at the lumbosacral junction. Extensive atherosclerosis. IMPRESSION: 1. No acute displaced fracture. 2. Mild symmetrical hip osteoarthritis. 3. Atherosclerosis. Electronically Signed   By: Randa Ngo M.D.   On: 07/23/2020 21:46    Procedures Procedures   Medications Ordered in ED Medications  lidocaine (LIDODERM) 5 % 1 patch (1 patch Transdermal Patch Applied 07/23/20 2302)  traMADol (ULTRAM) tablet 50 mg (50 mg Oral Given 07/23/20 2303)    ED Course  I have reviewed the triage vital signs and the nursing notes.  Pertinent labs & imaging results that were available during my care of the patient were reviewed by me and considered in my medical decision making (see chart for details).    MDM Rules/Calculators/A&P                          History and exam is consistent with sciatica, left greater than right.  Patient has no neuro deficits on exam or by history.  He has no urinary or fecal incontinence or retention, normal neuro exam here.  Imaging reviewed and discussed with patient and daughter at the bedside.  I suspect his symptoms are musculoskeletal/sciatica.  He was given a dose of tramadol here, also trial of Lidoderm topical patch.  He was encouraged activity as tolerated but minimizing heavy lifting as daughter at bedside states he is frequently outdoors chopping wood and lifting large logs.  Discussed heat therapy.  Tylenol arthritis strength.  Plan follow-up with his primary provider or orthopedics, he was given a referral. Final Clinical Impression(s) / ED Diagnoses Final diagnoses:  Sciatica, unspecified laterality  Osteoarthritis of spine with radiculopathy, lumbar region    Rx / DC Orders ED Discharge Orders         Ordered  traMADol (ULTRAM) 50 MG tablet  Every 6 hours PRN        07/23/20 2253    lidocaine (LIDODERM)  5 %  Every 24 hours        07/23/20 2253           Evalee Jefferson, Hershal Coria 07/23/20 2307    Isla Pence, MD 07/23/20 2316

## 2020-08-03 ENCOUNTER — Other Ambulatory Visit: Payer: Self-pay

## 2020-08-03 ENCOUNTER — Ambulatory Visit: Payer: Medicare Other

## 2020-08-03 ENCOUNTER — Ambulatory Visit (INDEPENDENT_AMBULATORY_CARE_PROVIDER_SITE_OTHER): Payer: Medicare Other | Admitting: Orthopaedic Surgery

## 2020-08-03 ENCOUNTER — Encounter: Payer: Self-pay | Admitting: Orthopaedic Surgery

## 2020-08-03 VITALS — BP 118/69 | HR 88 | Ht 72.0 in | Wt 201.0 lb

## 2020-08-03 DIAGNOSIS — M1A0720 Idiopathic chronic gout, left ankle and foot, without tophus (tophi): Secondary | ICD-10-CM

## 2020-08-03 DIAGNOSIS — F17210 Nicotine dependence, cigarettes, uncomplicated: Secondary | ICD-10-CM | POA: Diagnosis not present

## 2020-08-03 DIAGNOSIS — M79672 Pain in left foot: Secondary | ICD-10-CM

## 2020-08-03 DIAGNOSIS — G8929 Other chronic pain: Secondary | ICD-10-CM

## 2020-08-03 DIAGNOSIS — I739 Peripheral vascular disease, unspecified: Secondary | ICD-10-CM

## 2020-08-03 MED ORDER — ALLOPURINOL 300 MG PO TABS: 300.0000 mg | ORAL_TABLET | Freq: Every day | ORAL | 5 refills | Status: AC

## 2020-08-03 MED ORDER — PREDNISONE 5 MG (21) PO TBPK: ORAL_TABLET | ORAL | 0 refills | Status: DC

## 2020-08-03 NOTE — Progress Notes (Signed)
Subjective:    Patient ID: Bryce Powell, male    DOB: [birth year], 80 y.o.   MRN: [id number]  HPI He is here complaining of pain in the left foot and great toe area.  He has numbness of the foot on the left of the toes that has been present a while.  He has no trauma.  He has gout but is only taking colchicine.  He had no redness, no swelling.  He was seen in the ER on 07-23-20 for lower back pain hip pain.  X-rays were done and he was given Toradol which caused marked confusion.  He stopped that.  He smokes and has smoked for decades.  His pain limits his activities at home.  He prefers to sit as walking makes the feet hurt more.  It hurts the same day or night.  He has diabetes also.  Review of Systems  Constitutional: Positive for activity change.  Cardiovascular: Positive for palpitations.  Musculoskeletal: Positive for arthralgias, back pain and gait problem.  All other systems reviewed and are negative.  For Review of Systems, all other systems reviewed and are negative.  The following is a summary of the past history medically, past history surgically, known current medicines, social history and family history.  This information is gathered electronically by the computer from prior information and documentation.  I review this each visit and have found including this information at this point in the chart is beneficial and informative.   Past Medical History:  Diagnosis Date  . Current smoker   . Diabetes ([place])   . HTN (hypertension)   . PAF (paroxysmal atrial fibrillation) ([place])    post CABG  . 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 Tobacco abuse     Past Surgical History:  Procedure Laterality Date  . CORONARY ARTERY BYPASS GRAFT N/A 03/30/2017   Procedure: CORONARY ARTERY BYPASS GRAFTING (CABG) x Three, using left internal mammary artery and right leg greater saphenous vein  harvested endoscopically;  Surgeon: Rexene Alberts, MD;  Location: [place];  Service: Open Heart Surgery;  Laterality: N/A;  . LEFT HEART CATH AND CORONARY ANGIOGRAPHY N/A 03/29/2017   Procedure: LEFT HEART CATH AND CORONARY ANGIOGRAPHY;  Surgeon: Lorretta Harp, MD;  Location: [place] CV LAB;  Service: Cardiovascular;  Laterality: N/A;  . TEE WITHOUT CARDIOVERSION N/A 03/30/2017   Procedure: TRANSESOPHAGEAL ECHOCARDIOGRAM (TEE);  Surgeon: Rexene Alberts, MD;  Location: [place];  Service: Open Heart Surgery;  Laterality: N/A;    Current Outpatient Medications on File Prior to Visit  Medication Sig Dispense Refill  . acetaminophen (TYLENOL) 500 MG tablet Take 2 tablets (1,000 mg total) by mouth every 6 (six) hours. 30 tablet 0  . amLODipine (NORVASC) 5 MG tablet Take 5 mg by mouth daily.    Marland Kitchen aspirin EC 81 MG tablet Take 81 mg daily by mouth.    . colchicine 0.6 MG tablet Take 1 tablet by mouth 2 (two) times daily as needed.    Marland Kitchen glipiZIDE (GLUCOTROL XL) 10 MG 24 hr tablet Take 10 mg by mouth daily.    Marland Kitchen lidocaine (LIDODERM) 5 % Place 1 patch onto the skin daily. Remove & Discard patch within 12 hours or as directed by MD 30 patch 0  . losartan (COZAAR) 50 MG tablet Take 50 mg daily by mouth.    Marland Kitchen  metFORMIN (GLUCOPHAGE) 1000 MG tablet Take 1,000 mg 2 (two) times daily by mouth.    . rosuvastatin (CRESTOR) 5 MG tablet TAKE 1 TABLET BY MOUTH  DAILY 90 tablet 1  . traMADol (ULTRAM) 50 MG tablet Take 1 tablet (50 mg total) by mouth every 6 (six) hours as needed. 20 tablet 0  . nitroGLYCERIN (NITROSTAT) 0.4 MG SL tablet Place 1 tablet (0.4 mg total) under the tongue every 5 (five) minutes as needed for chest pain. 25 tablet 3   No current facility-administered medications on file prior to visit.    Social History   Socioeconomic History  . Marital status: Married    Spouse name: Engineer, civil (consulting)  . Number of children: 3  . Years of education: 10  . Highest education level: 10th grade   Occupational History  . Not on file  Tobacco Use  . Smoking status: Current Some Day Smoker    Packs/day: 2.00    Years: 60.00    Pack years: 120.00    Types: Cigars    Last attempt to quit: 03/28/2017    Years since quitting: 3.3  . Smokeless tobacco: Never Used  . Tobacco comment: Pt states , "I will probably quit now."  Vaping Use  . Vaping Use: Never used  Substance and Sexual Activity  . Alcohol use: No  . Drug use: No  . Sexual activity: Yes  Other Topics Concern  . Not on file  Social History Narrative  . Not on file   Social Determinants of Health   Financial Resource Strain: Not on file  Food Insecurity: Not on file  Transportation Needs: Not on file  Physical Activity: Not on file  Stress: Not on file  Social Connections: Not on file  Intimate Partner Violence: Not on file    Family History  Problem Relation Age of Onset  . Diabetes Mother   . Heart attack Mother   . Heart attack Father     BP 118/69   Pulse 88   Ht 6' (1.829 m)   Wt 201 lb (91.2 kg)   BMI 27.26 kg/m   Body mass index is 27.26 kg/m.      Objective:   Physical Exam Vitals and nursing note reviewed. Exam conducted with a chaperone present.  Constitutional:      Appearance: He is well-developed.  HENT:     Head: Normocephalic and atraumatic.  Eyes:     Conjunctiva/sclera: Conjunctivae normal.     Pupils: Pupils are equal, round, and reactive to light.  Cardiovascular:     Rate and Rhythm: Normal rate and regular rhythm.  Pulmonary:     Effort: Pulmonary effort is normal.  Abdominal:     Palpations: Abdomen is soft.  Musculoskeletal:     Cervical back: Normal range of motion and neck supple.       Feet:  Skin:    General: Skin is warm and dry.  Neurological:     Mental Status: He is alert and oriented to person, place, and time.     Cranial Nerves: No cranial nerve deficit.     Motor: No abnormal muscle tone.     Coordination: Coordination normal.     Deep Tendon  Reflexes: Reflexes are normal and symmetric. Reflexes normal.  Psychiatric:        Behavior: Behavior normal.        Thought Content: Thought content normal.        Judgment: Judgment normal.   X-rays were  done of the left foot, reported separately.  I have reviewed the X-rays done in the ER and the ER notes.  I have independently reviewed and interpreted x-rays of this patient done at another site by another physician or qualified health professional.         Assessment & Plan:   Encounter Diagnoses  Name Primary?  . Chronic foot pain, left Yes  . Idiopathic chronic gout of left foot without tophus   . Peripheral vascular disease ([place])   . Nicotine dependence, cigarettes, uncomplicated    I am also concerned about peripheral neuropathy.  He smokes and is diabetic.  I will begin allopurinol and prednisone dose pack.  He will most likely need further evaluation of circulation and nerve status of the feet.  Return in two weeks.  Call if any problem.  Precautions discussed.   Electronically Signed Sanjuana Kava, MD 3/22/20229:20 AM

## 2020-08-19 ENCOUNTER — Other Ambulatory Visit: Payer: Self-pay

## 2020-08-19 ENCOUNTER — Ambulatory Visit: Payer: Medicare Other | Admitting: Orthopaedic Surgery

## 2020-08-19 ENCOUNTER — Encounter: Payer: Self-pay | Admitting: Orthopaedic Surgery

## 2020-08-19 VITALS — BP 126/58 | HR 82 | Ht 72.0 in | Wt 201.0 lb

## 2020-08-19 DIAGNOSIS — M1A0720 Idiopathic chronic gout, left ankle and foot, without tophus (tophi): Secondary | ICD-10-CM

## 2020-08-19 DIAGNOSIS — F17210 Nicotine dependence, cigarettes, uncomplicated: Secondary | ICD-10-CM

## 2020-08-19 DIAGNOSIS — M79672 Pain in left foot: Secondary | ICD-10-CM | POA: Diagnosis not present

## 2020-08-19 DIAGNOSIS — E119 Type 2 diabetes mellitus without complications: Secondary | ICD-10-CM | POA: Insufficient documentation

## 2020-08-19 DIAGNOSIS — G8929 Other chronic pain: Secondary | ICD-10-CM

## 2020-08-19 DIAGNOSIS — I739 Peripheral vascular disease, unspecified: Secondary | ICD-10-CM | POA: Diagnosis not present

## 2020-08-19 DIAGNOSIS — E139 Other specified diabetes mellitus without complications: Secondary | ICD-10-CM | POA: Diagnosis not present

## 2020-08-19 NOTE — Progress Notes (Signed)
Patient XV:Bryce Powell, male DOB:1941-01-11, 80 y.o. PYP:[id number]  Chief Complaint  Patient presents with  . Foot Pain    L/hurting and foot feels numb     HPI  Bryce Powell is a 80 y.o. male who has pain in the left foot and some in the right foot.  It is related to peripheral neuropathy from diabetes and age.  I have added allopurinol to his medicine to take daily.  He is on this now.  I will get new uric acid levels.  I feel his main issue is pain in the feet from his neuropathy.  He does not manage his diabetes well according to his daughter who is present.  He also smokes.   Body mass index is 27.26 kg/m.  ROS  Review of Systems  Constitutional: Positive for activity change.  Cardiovascular: Positive for palpitations.  Musculoskeletal: Positive for arthralgias, back pain and gait problem.  All other systems reviewed and are negative.   All other systems reviewed and are negative.  The following is a summary of the past history medically, past history surgically, known current medicines, social history and family history.  This information is gathered electronically by the computer from prior information and documentation.  I review this each visit and have found including this information at this point in the chart is beneficial and informative.    Past Medical History:  Diagnosis Date  . Current smoker   . Diabetes ([place])   . HTN (hypertension)   . PAF (paroxysmal atrial fibrillation) ([place])    post CABG  . 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 Tobacco abuse     Past Surgical History:  Procedure Laterality Date  . CORONARY ARTERY BYPASS GRAFT N/A 03/30/2017   Procedure: CORONARY ARTERY BYPASS GRAFTING (CABG) x Three, using left internal mammary artery and right leg greater saphenous vein harvested endoscopically;  Surgeon: Rexene Alberts, MD;  Location: [place];  Service: Open  Heart Surgery;  Laterality: N/A;  . LEFT HEART CATH AND CORONARY ANGIOGRAPHY N/A 03/29/2017   Procedure: LEFT HEART CATH AND CORONARY ANGIOGRAPHY;  Surgeon: Lorretta Harp, MD;  Location: [place] CV LAB;  Service: Cardiovascular;  Laterality: N/A;  . TEE WITHOUT CARDIOVERSION N/A 03/30/2017   Procedure: TRANSESOPHAGEAL ECHOCARDIOGRAM (TEE);  Surgeon: Rexene Alberts, MD;  Location: [place];  Service: Open Heart Surgery;  Laterality: N/A;    Family History  Problem Relation Age of Onset  . Diabetes Mother   . Heart attack Mother   . Heart attack Father     Social History Social History   Tobacco Use  . Smoking status: Current Some Day Smoker    Packs/day: 2.00    Years: 60.00    Pack years: 120.00    Types: Cigars    Last attempt to quit: 03/28/2017    Years since quitting: 3.3  . Smokeless tobacco: Never Used  . Tobacco comment: Pt states , "I will probably quit now."  Vaping Use  . Vaping Use: Never used  Substance Use Topics  . Alcohol use: No  . Drug use: No    Allergies  Allergen Reactions  . Atorvastatin     MUSCLE ACHES     Current Outpatient Medications  Medication Sig Dispense Refill  . acetaminophen (TYLENOL) 500 MG tablet Take 2 tablets (1,000 mg total) by mouth every  6 (six) hours. 30 tablet 0  . allopurinol (ZYLOPRIM) 300 MG tablet Take 1 tablet (300 mg total) by mouth daily. 30 tablet 5  . amLODipine (NORVASC) 5 MG tablet Take 5 mg by mouth daily.    Marland Kitchen aspirin EC 81 MG tablet Take 81 mg daily by mouth.    . colchicine 0.6 MG tablet Take 1 tablet by mouth 2 (two) times daily as needed.    Marland Kitchen glipiZIDE (GLUCOTROL XL) 10 MG 24 hr tablet Take 10 mg by mouth daily.    Marland Kitchen lidocaine (LIDODERM) 5 % Place 1 patch onto the skin daily. Remove & Discard patch within 12 hours or as directed by MD 30 patch 0  . losartan (COZAAR) 50 MG tablet Take 50 mg daily by mouth.    . metFORMIN (GLUCOPHAGE) 1000 MG tablet Take 1,000 mg 2 (two) times daily by mouth.    .  nitroGLYCERIN (NITROSTAT) 0.4 MG SL tablet Place 1 tablet (0.4 mg total) under the tongue every 5 (five) minutes as needed for chest pain. 25 tablet 3  . predniSONE (STERAPRED UNI-PAK 21 TAB) 5 MG (21) TBPK tablet Take 6 pills first day; 5 pills second day; 4 pills third day; 3 pills fourth day; 2 pills next day and 1 pill last day. 21 tablet 0  . rosuvastatin (CRESTOR) 5 MG tablet TAKE 1 TABLET BY MOUTH  DAILY 90 tablet 1  . traMADol (ULTRAM) 50 MG tablet Take 1 tablet (50 mg total) by mouth every 6 (six) hours as needed. 20 tablet 0   No current facility-administered medications for this visit.     Physical Exam  Blood pressure (!) 126/58, pulse 82, height 6' (1.829 m), weight 201 lb (91.2 kg).  Constitutional: overall normal hygiene, normal nutrition, well developed, normal grooming, normal body habitus. Assistive device:none  Musculoskeletal: gait and station Limp left, muscle tone and strength are normal, no tremors or atrophy is present.  .  Neurological: coordination overall normal.  Deep tendon reflex/nerve stretch intact.  Sensation abnormal in feet.  Cranial nerves II-XII intact.   Skin:   Normal overall no scars, lesions, ulcers or rashes. No psoriasis.  Psychiatric: Alert and oriented x 3.  Recent memory intact, remote memory unclear.  Normal mood and affect. Well groomed.  Good eye contact.  Cardiovascular: overall no swelling, no varicosities, no edema bilaterally, normal temperatures of the legs and arms, no clubbing, cyanosis and just fair capillary refill of feet.  Pulses present but decreased in feet.  Sensation decreased.  Lymphatic: palpation is normal.  All other systems reviewed and are negative   The patient has been educated about the nature of the problem(s) and counseled on treatment options.  The patient appeared to understand what I have discussed and is in agreement with it.  Encounter Diagnoses  Name Primary?  . Chronic foot pain, left Yes  .  Peripheral vascular disease ([place])   . Diabetes 1.5, managed as type 1 ([place])   . Nicotine dependence, cigarettes, uncomplicated   . Idiopathic chronic gout of left foot without tophus     PLAN Call if any problems.  Precautions discussed.  Continue current medications.   Return to clinic to see family doctor about further treatment for peripheral neuropathy.  continue the allopurinol.   Electronically Signed Sanjuana Kava, MD 4/7/20228:52 AM

## 2020-08-19 NOTE — Patient Instructions (Signed)

## 2020-08-23 ENCOUNTER — Other Ambulatory Visit (HOSPITAL_COMMUNITY): Payer: Self-pay | Admitting: Physician Assistant

## 2020-08-23 DIAGNOSIS — M79671 Pain in right foot: Secondary | ICD-10-CM

## 2020-08-23 DIAGNOSIS — M79672 Pain in left foot: Secondary | ICD-10-CM

## 2020-08-26 ENCOUNTER — Ambulatory Visit (HOSPITAL_COMMUNITY)
Admission: RE | Admit: 2020-08-26 | Discharge: 2020-08-26 | Disposition: A | Discharge status: Home / Self Care | Payer: Medicare Other | Source: Ambulatory Visit | Attending: Physician Assistant | Admitting: Physician Assistant

## 2020-08-26 ENCOUNTER — Other Ambulatory Visit: Payer: Self-pay

## 2020-08-26 DIAGNOSIS — M79671 Pain in right foot: Secondary | ICD-10-CM | POA: Diagnosis not present

## 2020-08-26 DIAGNOSIS — M79672 Pain in left foot: Secondary | ICD-10-CM | POA: Insufficient documentation

## 2020-08-31 ENCOUNTER — Encounter: Payer: Self-pay | Admitting: *Deleted

## 2020-08-31 NOTE — Progress Notes (Signed)
Reached out to [employer] to introduce myself as the office RN Navigator and explain our new patient process. Spoke to his wife, Lorene.  Reviewed the reason for their referral and scheduled their new patient appointment along with labs. Provided address and directions to the office including call back phone number. Reviewed with patient any concerns they may have or any possible barriers to attending their appointment.   Informed patient about my role as a navigator and that I will meet with them prior to their New Patient appointment and more fully discuss what services I can provide. At this time patient has no further questions or needs.   Oncology Nurse Navigator Documentation  Oncology Nurse Navigator Flowsheets 08/31/2020  Abnormal Finding Date 08/26/2020  Diagnosis Status Additional Work Up  Navigator Follow Up Date: 09/03/2020  Navigator Follow Up Reason: New Patient Appointment  Navigator Location [facility]  Referral Date to RadOnc/MedOnc 08/30/2020  Navigator Encounter Type Introductory Phone Call  Patient Visit Type MedOnc  Treatment Phase Abnormal Scans  Barriers/Navigation Needs Coordination of Care;Education  Education Other  Interventions Coordination of Care;Education;Psycho-Social Support  Acuity Level 2-Minimal Needs (1-2 Barriers Identified)  Coordination of Care Appts  Education Method Verbal  Support Groups/Services Friends and Family  Time Spent with Patient 62

## 2020-09-03 ENCOUNTER — Encounter: Payer: Self-pay | Admitting: *Deleted

## 2020-09-03 ENCOUNTER — Other Ambulatory Visit: Payer: Self-pay

## 2020-09-03 ENCOUNTER — Inpatient Hospital Stay: Payer: Medicare Other | Attending: Hematology & Oncology

## 2020-09-03 ENCOUNTER — Inpatient Hospital Stay (HOSPITAL_COMMUNITY): Payer: Medicare Other

## 2020-09-03 ENCOUNTER — Inpatient Hospital Stay (HOSPITAL_COMMUNITY)
Admission: AD | Admit: 2020-09-03 | Discharge: 2020-09-06 | DRG: 054 | Disposition: A | Discharge status: Home / Self Care | Payer: Medicare Other | Source: Ambulatory Visit | Attending: Internal Medicine | Admitting: Internal Medicine

## 2020-09-03 ENCOUNTER — Encounter (HOSPITAL_COMMUNITY): Payer: Self-pay | Admitting: Family Medicine

## 2020-09-03 ENCOUNTER — Encounter: Payer: Self-pay | Admitting: Hematology & Oncology

## 2020-09-03 ENCOUNTER — Inpatient Hospital Stay (HOSPITAL_BASED_OUTPATIENT_CLINIC_OR_DEPARTMENT_OTHER): Payer: Medicare Other | Admitting: Hematology & Oncology

## 2020-09-03 VITALS — BP 126/44 | HR 88 | Temp 98.1°F | Resp 18

## 2020-09-03 DIAGNOSIS — G939 Disorder of brain, unspecified: Secondary | ICD-10-CM | POA: Diagnosis not present

## 2020-09-03 DIAGNOSIS — E0800 Diabetes mellitus due to underlying condition with hyperosmolarity without nonketotic hyperglycemic-hyperosmolar coma (NKHHC): Secondary | ICD-10-CM

## 2020-09-03 DIAGNOSIS — C3490 Malignant neoplasm of unspecified part of unspecified bronchus or lung: Principal | ICD-10-CM | POA: Diagnosis present

## 2020-09-03 DIAGNOSIS — M954 Acquired deformity of chest and rib: Secondary | ICD-10-CM | POA: Diagnosis present

## 2020-09-03 DIAGNOSIS — R32 Unspecified urinary incontinence: Secondary | ICD-10-CM | POA: Diagnosis present

## 2020-09-03 DIAGNOSIS — R918 Other nonspecific abnormal finding of lung field: Secondary | ICD-10-CM | POA: Diagnosis not present

## 2020-09-03 DIAGNOSIS — J189 Pneumonia, unspecified organism: Secondary | ICD-10-CM | POA: Diagnosis present

## 2020-09-03 DIAGNOSIS — Z8249 Family history of ischemic heart disease and other diseases of the circulatory system: Secondary | ICD-10-CM

## 2020-09-03 DIAGNOSIS — E1136 Type 2 diabetes mellitus with diabetic cataract: Secondary | ICD-10-CM | POA: Diagnosis present

## 2020-09-03 DIAGNOSIS — E1100 Type 2 diabetes mellitus with hyperosmolarity without nonketotic hyperglycemic-hyperosmolar coma (NKHHC): Secondary | ICD-10-CM | POA: Diagnosis present

## 2020-09-03 DIAGNOSIS — G936 Cerebral edema: Secondary | ICD-10-CM | POA: Diagnosis present

## 2020-09-03 DIAGNOSIS — E139 Other specified diabetes mellitus without complications: Secondary | ICD-10-CM

## 2020-09-03 DIAGNOSIS — Z951 Presence of aortocoronary bypass graft: Secondary | ICD-10-CM | POA: Diagnosis not present

## 2020-09-03 DIAGNOSIS — R590 Localized enlarged lymph nodes: Secondary | ICD-10-CM | POA: Diagnosis present

## 2020-09-03 DIAGNOSIS — I4510 Unspecified right bundle-branch block: Secondary | ICD-10-CM | POA: Diagnosis present

## 2020-09-03 DIAGNOSIS — Z888 Allergy status to other drugs, medicaments and biological substances status: Secondary | ICD-10-CM | POA: Diagnosis not present

## 2020-09-03 DIAGNOSIS — Z833 Family history of diabetes mellitus: Secondary | ICD-10-CM

## 2020-09-03 DIAGNOSIS — I480 Paroxysmal atrial fibrillation: Secondary | ICD-10-CM | POA: Diagnosis present

## 2020-09-03 DIAGNOSIS — F17210 Nicotine dependence, cigarettes, uncomplicated: Secondary | ICD-10-CM

## 2020-09-03 DIAGNOSIS — R599 Enlarged lymph nodes, unspecified: Secondary | ICD-10-CM

## 2020-09-03 DIAGNOSIS — F17290 Nicotine dependence, other tobacco product, uncomplicated: Secondary | ICD-10-CM | POA: Diagnosis present

## 2020-09-03 DIAGNOSIS — C7931 Secondary malignant neoplasm of brain: Secondary | ICD-10-CM

## 2020-09-03 DIAGNOSIS — I10 Essential (primary) hypertension: Secondary | ICD-10-CM | POA: Diagnosis present

## 2020-09-03 DIAGNOSIS — Z7984 Long term (current) use of oral hypoglycemic drugs: Secondary | ICD-10-CM

## 2020-09-03 DIAGNOSIS — R609 Edema, unspecified: Secondary | ICD-10-CM

## 2020-09-03 DIAGNOSIS — J440 Chronic obstructive pulmonary disease with acute lower respiratory infection: Secondary | ICD-10-CM | POA: Diagnosis present

## 2020-09-03 DIAGNOSIS — Z79899 Other long term (current) drug therapy: Secondary | ICD-10-CM | POA: Diagnosis not present

## 2020-09-03 DIAGNOSIS — Z993 Dependence on wheelchair: Secondary | ICD-10-CM

## 2020-09-03 DIAGNOSIS — E119 Type 2 diabetes mellitus without complications: Secondary | ICD-10-CM

## 2020-09-03 DIAGNOSIS — Z20822 Contact with and (suspected) exposure to covid-19: Secondary | ICD-10-CM | POA: Diagnosis present

## 2020-09-03 DIAGNOSIS — R634 Abnormal weight loss: Secondary | ICD-10-CM | POA: Diagnosis present

## 2020-09-03 DIAGNOSIS — Z7982 Long term (current) use of aspirin: Secondary | ICD-10-CM

## 2020-09-03 DIAGNOSIS — R54 Age-related physical debility: Secondary | ICD-10-CM | POA: Diagnosis present

## 2020-09-03 DIAGNOSIS — Z794 Long term (current) use of insulin: Secondary | ICD-10-CM | POA: Diagnosis not present

## 2020-09-03 HISTORY — DX: Sleep apnea, unspecified: G47.30

## 2020-09-03 LAB — CMP (CANCER CENTER ONLY)
ALT: 15 U/L (ref 0–44)
AST: 12 U/L — ABNORMAL LOW (ref 15–41)
Albumin: 3.8 g/dL (ref 3.5–5.0)
Alkaline Phosphatase: 69 U/L (ref 38–126)
Anion gap: 8 (ref 5–15)
BUN: 20 mg/dL (ref 8–23)
CO2: 30 mmol/L (ref 22–32)
Calcium: 10.1 mg/dL (ref 8.9–10.3)
Chloride: 97 mmol/L — ABNORMAL LOW (ref 98–111)
Creatinine: 0.92 mg/dL (ref 0.61–1.24)
GFR, Estimated: >60 mL/min (ref >60)
Glucose, Bld: 177 mg/dL — ABNORMAL HIGH (ref 70–99)
Potassium: 4.4 mmol/L (ref 3.5–5.1)
Sodium: 135 mmol/L (ref 135–145)
Total Bilirubin: 0.4 mg/dL (ref 0.3–1.2)
Total Protein: 7.5 g/dL (ref 6.5–8.1)

## 2020-09-03 LAB — GLUCOSE, CAPILLARY
Glucose-Capillary: 237 mg/dL — ABNORMAL HIGH (ref 70–99)
Glucose-Capillary: 232 mg/dL — ABNORMAL HIGH (ref 70–99)

## 2020-09-03 LAB — CBC WITH DIFFERENTIAL (CANCER CENTER ONLY)
Abs Immature Granulocytes: 0.1 10*3/uL — ABNORMAL HIGH (ref 0.00–0.07)
Basophils Absolute: 0 10*3/uL (ref 0.0–0.1)
Basophils Relative: 0 %
Eosinophils Absolute: 0.1 10*3/uL (ref 0.0–0.5)
Eosinophils Relative: 1 %
HCT: 34.4 % — ABNORMAL LOW (ref 39.0–52.0)
Hemoglobin: 11.2 g/dL — ABNORMAL LOW (ref 13.0–17.0)
Immature Granulocytes: 1 %
Lymphocytes Relative: 13 %
Lymphs Abs: 2.1 10*3/uL (ref 0.7–4.0)
MCH: 27.9 pg (ref 26.0–34.0)
MCHC: 32.6 g/dL (ref 30.0–36.0)
MCV: 85.8 fL (ref 80.0–100.0)
Monocytes Absolute: 1.2 10*3/uL — ABNORMAL HIGH (ref 0.1–1.0)
Monocytes Relative: 8 %
Neutro Abs: 12.8 10*3/uL — ABNORMAL HIGH (ref 1.7–7.7)
Neutrophils Relative %: 77 %
Platelet Count: 321 10*3/uL (ref 150–400)
RBC: 4.01 MIL/uL — ABNORMAL LOW (ref 4.22–5.81)
RDW: 17.2 % — ABNORMAL HIGH (ref 11.5–15.5)
WBC Count: 16.4 10*3/uL — ABNORMAL HIGH (ref 4.0–10.5)
nRBC: 0 % (ref 0.0–0.2)

## 2020-09-03 LAB — CEA (IN HOUSE-CHCC): CEA (CHCC-In House): 1.15 ng/mL (ref 0.00–5.00)

## 2020-09-03 LAB — LACTATE DEHYDROGENASE: LDH: 139 U/L (ref 98–192)

## 2020-09-03 LAB — HEMOGLOBIN A1C
Hgb A1c MFr Bld: 8.3 % — ABNORMAL HIGH (ref 4.8–5.6)
Mean Plasma Glucose: 191.51 mg/dL

## 2020-09-03 LAB — PREALBUMIN: Prealbumin: 26.5 mg/dL (ref 18–38)

## 2020-09-03 MED ORDER — INSULIN ASPART 100 UNIT/ML ~~LOC~~ SOLN
0.0000 [IU] | Freq: Three times a day (TID) | SUBCUTANEOUS | Status: DC
  Administered 2020-09-03: 3 [IU] via SUBCUTANEOUS
  Administered 2020-09-04: 7 [IU] via SUBCUTANEOUS
  Administered 2020-09-04: 5 [IU] via SUBCUTANEOUS
  Administered 2020-09-04: 7 [IU] via SUBCUTANEOUS
  Administered 2020-09-05: 10 [IU] via SUBCUTANEOUS
  Administered 2020-09-05: 7 [IU] via SUBCUTANEOUS

## 2020-09-03 MED ORDER — SODIUM CHLORIDE 0.9% FLUSH
3.0000 mL | INTRAVENOUS | Status: DC | PRN
  Administered 2020-09-03: 3 mL via INTRAVENOUS

## 2020-09-03 MED ORDER — SODIUM CHLORIDE 0.9% FLUSH
3.0000 mL | Freq: Two times a day (BID) | INTRAVENOUS | Status: DC
  Administered 2020-09-03 – 2020-09-06 (×7): 3 mL via INTRAVENOUS

## 2020-09-03 MED ORDER — LEVETIRACETAM 500 MG PO TABS
500.0000 mg | ORAL_TABLET | Freq: Two times a day (BID) | ORAL | Status: DC
  Administered 2020-09-03 – 2020-09-06 (×6): 500 mg via ORAL
  Filled 2020-09-03 (×6): qty 1

## 2020-09-03 MED ORDER — SODIUM CHLORIDE 0.9 % IV SOLN: 250.0000 mL | INTRAVENOUS | Status: DC | PRN

## 2020-09-03 MED ORDER — DEXAMETHASONE SODIUM PHOSPHATE 10 MG/ML IJ SOLN
10.0000 mg | Freq: Three times a day (TID) | INTRAMUSCULAR | Status: DC
  Administered 2020-09-03 – 2020-09-06 (×10): 10 mg via INTRAVENOUS
  Filled 2020-09-03 (×10): qty 1

## 2020-09-03 MED ORDER — IOHEXOL 9 MG/ML PO SOLN
500.0000 mL | ORAL | Status: AC
  Administered 2020-09-03 (×2): 500 mL via ORAL

## 2020-09-03 MED ORDER — LEVETIRACETAM 500 MG PO TABS: 500.0000 mg | ORAL_TABLET | Freq: Two times a day (BID) | ORAL | Status: DC

## 2020-09-03 MED ORDER — IOHEXOL 300 MG/ML  SOLN
100.0000 mL | Freq: Once | INTRAMUSCULAR | Status: AC | PRN
  Administered 2020-09-03: 100 mL via INTRAVENOUS

## 2020-09-03 NOTE — Progress Notes (Signed)
Updated patient's spouse, Lorene via phone.

## 2020-09-03 NOTE — H&P (Signed)
History and Physical    Bryce Powell UYQ:[id number] DOB: 1940/09/11 DOA: 09/03/2020  PCP: Candi Leash, PA-C  Patient coming from: Home  Chief Complaint: Weight loss  HPI: Bryce Powell is Powell 80 y.o. male  80 yo male smoker, wt loss 80lbs in Powell year, ams on/off sent as referral to oncology office.   ct brain recently shows couple tumors, ct chest lung mass right lung (done outside of system).  Sees dr Marin Olp for the first time today.    All history obtained from family members wife and daughter at bedside.  They report that he has had progressive weight loss over the last year of over 80 pounds.  He does not complain of any pain anywhere he does have peripheral vascular disease.  He is not had any vomiting blood.  He has not had any chronic blood loss or bleeding from anywhere.  Over the last month he is memory has declined and he seems more confused than normal.  He has not had any fevers.  He is not had any cough or hemoptysis.  He has not had any shortness of breath.  Has associated edema with brain tumors.  Has not had bx done.  Afvss.  Oncology would like w/u done as inpt.    Direct admit to med surg.  Start Decadron 10mg  iv q 8 hours and keppra 500mg  bid.  Ct chest abd pelvis, mri brain will need to be ordered.   No expectation for neurology services involved during hospitalization.          Review of Systems: Obtained from wife and daughter at bedside per HPI  Past Medical History:  Diagnosis Date  . Current smoker   . Diabetes ([place])   . HTN (hypertension)   . PAF (paroxysmal atrial fibrillation) ([place])    post CABG  . 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 Tobacco abuse     Past Surgical History:  Procedure Laterality Date  . CORONARY ARTERY BYPASS GRAFT N/Powell 03/30/2017   Procedure: CORONARY ARTERY BYPASS GRAFTING (CABG) x Three, using left internal mammary artery and right leg greater  saphenous vein harvested endoscopically;  Surgeon: Rexene Alberts, MD;  Location: [place];  Service: Open Heart Surgery;  Laterality: N/Powell;  . LEFT HEART CATH AND CORONARY ANGIOGRAPHY N/Powell 03/29/2017   Procedure: LEFT HEART CATH AND CORONARY ANGIOGRAPHY;  Surgeon: Lorretta Harp, MD;  Location: [place] [facility];  Service: Cardiovascular;  Laterality: N/Powell;  . TEE WITHOUT CARDIOVERSION N/Powell 03/30/2017   Procedure: TRANSESOPHAGEAL ECHOCARDIOGRAM (TEE);  Surgeon: Rexene Alberts, MD;  Location: [place];  Service: Open Heart Surgery;  Laterality: N/Powell;     reports that he has been smoking cigars. He has Powell 120.00 pack-year smoking history. He has never used smokeless tobacco. He reports that he does not drink alcohol and does not use drugs.  Allergies  Allergen Reactions  . Atorvastatin     MUSCLE ACHES     Family History  Problem Relation Age of Onset  . Diabetes Mother   . Heart attack Mother   . Heart attack Father     Prior to Admission medications   Medication Sig Start Date End Date Taking? Authorizing Provider  acetaminophen (TYLENOL) 500 MG tablet Take 2 tablets (1,000 mg total) by mouth every 6 (six) hours. 04/04/17   Elgie Collard, PA-C  allopurinol (ZYLOPRIM) 300 MG tablet Take 1 tablet (300 mg total) by mouth daily. 08/03/20   Sanjuana Kava, MD  amLODipine (NORVASC) 5 MG tablet Take 5 mg by mouth daily. 07/21/19   [provider]  aspirin EC 81 MG tablet Take 81 mg daily by mouth.    [provider]  colchicine 0.6 MG tablet Take 1 tablet by mouth 2 (two) times daily as needed. 06/17/18   [provider]  glipiZIDE (GLUCOTROL XL) 10 MG 24 hr tablet Take 10 mg by mouth daily. 07/21/19   [provider]  lidocaine (LIDODERM) 5 % Place 1 patch onto the skin daily. Remove & Discard patch within 12 hours or as directed by MD 07/23/20   Evalee Jefferson, PA-C  losartan (COZAAR) 50 MG tablet Take 50 mg daily by mouth.    [provider]  metFORMIN  (GLUCOPHAGE) 1000 MG tablet Take 1,000 mg 2 (two) times daily by mouth. 03/01/17   [provider]  nitroGLYCERIN (NITROSTAT) 0.4 MG SL tablet Place 1 tablet (0.4 mg total) under the tongue every 5 (five) minutes as needed for chest pain. 06/12/17 09/11/19  Herminio Commons, MD  predniSONE (STERAPRED UNI-PAK 21 TAB) 5 MG (21) TBPK tablet Take 6 pills first day; 5 pills second day; 4 pills third day; 3 pills fourth day; 2 pills next day and 1 pill last day. 08/03/20   Sanjuana Kava, MD  rosuvastatin (CRESTOR) 5 MG tablet TAKE 1 TABLET BY MOUTH  DAILY 05/27/18   Herminio Commons, MD  traMADol (ULTRAM) 50 MG tablet Take 1 tablet (50 mg total) by mouth every 6 (six) hours as needed. 07/23/20   Evalee Jefferson, PA-C    Physical Exam: Vitals:   09/03/20 1512  BP: (!) 118/54  Pulse: 81  Resp: 18  Temp: 98.3 F (36.8 C)  TempSrc: Oral  SpO2: 95%      Constitutional: NAD, calm, comfortable Vitals:   09/03/20 1512  BP: (!) 118/54  Pulse: 81  Resp: 18  Temp: 98.3 F (36.8 C)  TempSrc: Oral  SpO2: 95%   Eyes: PERRL, lids and conjunctivae normal ENMT: Mucous membranes are moist. Posterior pharynx clear of any exudate or lesions.Normal dentition.  Neck: normal, supple, no masses, no thyromegaly Respiratory: clear to auscultation bilaterally, no wheezing, no crackles. Normal respiratory effort. No accessory muscle use.  Cardiovascular: Regular rate and rhythm, no murmurs / rubs / gallops. No extremity edema. 2+ pedal pulses. No carotid bruits.  Abdomen: no tenderness, no masses palpated. No hepatosplenomegaly. Bowel sounds positive.  Musculoskeletal: no clubbing / cyanosis. No joint deformity upper and lower extremities. Good ROM, no contractures. Normal muscle tone.  Skin: no rashes, lesions, ulcers. No induration Neurologic: CN 2-12 grossly intact. Sensation intact, DTR normal. Strength 5/5 in all 4.  Psychiatric: Normal judgment and insight. Alert and oriented x 1. Normal mood.     Labs on Admission: I have personally reviewed following labs and imaging studies  CBC: Recent Labs  Lab 09/03/20 1018  WBC 16.4*  NEUTROABS 12.8*  HGB 11.2*  HCT 34.4*  MCV 85.8  PLT 947   Basic Metabolic Panel: Recent Labs  Lab 09/03/20 1018  NA 135  K 4.4  CL 97*  CO2 30  GLUCOSE 177*  BUN 20  CREATININE 0.92  CALCIUM 10.1   GFR: CrCl cannot be calculated (Unknown ideal weight.). Liver Function Tests: Recent Labs  Lab 09/03/20 1018  AST 12*  ALT 15  ALKPHOS 69  BILITOT 0.4  PROT 7.5  ALBUMIN 3.8   No results for input(s): LIPASE, AMYLASE in the last 168 hours. No results for input(s): AMMONIA in the last 168 hours. Coagulation Profile: No results for input(s): INR, PROTIME in the last 168 hours. Cardiac Enzymes: No results for input(s): CKTOTAL, CKMB, CKMBINDEX, TROPONINI in the last 168 hours. BNP (last 3 results) No results for input(s): PROBNP in the last 8760 hours. HbA1C: No results for input(s): HGBA1C in the last 72 hours. CBG: No results for input(s): GLUCAP in the last 168 hours. Lipid Profile: No results for input(s): CHOL, HDL, LDLCALC, TRIG, CHOLHDL, LDLDIRECT in the last 72 hours. Thyroid Function Tests: No results for input(s): TSH, T4TOTAL, FREET4, T3FREE, THYROIDAB in the last 72 hours. Anemia Panel: No results for input(s): VITAMINB12, FOLATE, FERRITIN, TIBC, IRON, RETICCTPCT in the last 72 hours. Urine analysis: No results found for: COLORURINE, APPEARANCEUR, LABSPEC, PHURINE, GLUCOSEU, HGBUR, BILIRUBINUR, KETONESUR, PROTEINUR, UROBILINOGEN, NITRITE, LEUKOCYTESUR Sepsis Labs: !!!!!!!!!!!!!!!!!!!!!!!!!!!!!!!!!!!!!!!!!!!! @LABRCNTIP (procalcitonin:4,lacticidven:4) )No results found for this or any previous visit (from the past 240 hour(s)).   Radiological Exams on Admission: No results found.  Old chart reviewed Case discussed with Dr. Marin Olp  Assessment/Plan  80 year old male with recent CT brain imaging showing several  metastatic lesions of unclear primary with associated edema  Principal Problem:    Brain edema (HCC)-placed on Decadron 10 mg IV every 8 hours.  Also placed on Keppra 5 mg twice Powell day per oncology recommendations.  Obtain MRI brain.  Obtain CT abdomen and chest and pelvis for staging.  Placed on seizure precautions.  Active Problems:    HTN (hypertension)-clarify home meds    S/P CABG x 3-hold aspirin at this time    Diabetes (HCC)-placed on sliding scale insulin.  Expect increasing glucose levels while on Decadron.   Further recommendations pending on over all hospital course   DVT prophylaxis: SCDs Code Status: Full Family Communication: Wife and daughter Disposition Plan: 1 to 2 days Consults called: Oncology Admission status: Admission   Nino,Bryce Bitter A MD [facility]  If 7PM-7AM, please contact night-coverage [email or web address] Password [facility]  09/03/2020, 4:06 PM

## 2020-09-03 NOTE — Progress Notes (Signed)
80 yo male smoker, wt loss 80lbs in a year, ams on/off sent as referral to oncology office.   ct brain recently shows couple tumors, ct chest lung mass right lung (done outside of system).  Sees dr Marin Olp for the first time today.  Has associated edema with brain tumors.  Has not had bx done.  Afvss.  Oncology would like w/u done as inpt.  Admit to med surg.  Start Decadron 10mg  iv q 8 hours and keppra 500mg  bid.  Ct chest abd pelvis, mri brain will need to be ordered.  Dr Cherlynn Polo sending from his [place].  He will see pt in the am.  No expectation for neurology services involved during hospitalization.

## 2020-09-03 NOTE — Progress Notes (Signed)
Initial RN Navigator Patient Visit  Name: Bryce Powell Date of Referral : 08/30/2020 Diagnosis: Brain mets  Met with patient prior to their visit with MD. Wife and daughter are present in the room with him.  Spoke with patient and family regarding  my role, areas in which I am able to help, and all the contact information for myself and the office. Also gave patient MD and Navigator business card. Reviewed with patient the general overview of expected course after initial diagnosis and time frame for all steps to be completed.   Patient completed visit with Dr. Marin Olp. Due to patient condition and severity of brain mets, Dr Marin Olp will have the patient admitted to the hospital service at [facility]. Will continue to follow patient for discharge needs and follow up.   Oncology Nurse Navigator Documentation  Oncology Nurse Navigator Flowsheets 09/03/2020  Abnormal Finding Date -  Diagnosis Status -  Navigator Follow Up Date: -  Navigator Follow Up Reason: -  Navigator Location [facility]  Referral Date to RadOnc/MedOnc -  Navigator Encounter Type Initial MedOnc  Patient Visit Type MedOnc  Treatment Phase Abnormal Scans  Barriers/Navigation Needs Coordination of Care;Education  Education Other  Interventions Coordination of Care;Education;Psycho-Social Support  Acuity Level 2-Minimal Needs (1-2 Barriers Identified)  Coordination of Care Other  Education Method Verbal;Written  Support Groups/Services Friends and Family  Time Spent with Patient 30

## 2020-09-03 NOTE — Progress Notes (Signed)
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 New Patient (Initial Visit)  : Patient really cannot give me much history.  HPI: Bryce Powell is a nice 80 year old white male.  He lives up in [place].  He has a long history of tobacco use.  He still smokes cigarettes.  He probably smokes about a pack a day.  I would have said that he probably has about 120-pack-year history of tobacco use.  He has been noticed by his family to have lost quite a bit of weight.  According to his wife, he thinks they may have lost about 80 pounds.  He really has a little bit of disorientation.  He says that he is eating okay but his wife says that he is not.  He was having some pain in his legs.  He was treated for gout.  This did not really improve.  He then was found to have some confusion.  His family doctor was able to get a CT of the brain.  I think this was done a couple weeks ago.  Surprisingly, this seemed to show a 2.9 cm mass in the left posterior temporal lobe.  There is a 1.3 cm lesion left parietal lobe.  He had extensive significant edema.  There was some midline shift.  He had a CT of the chest.  This showed a large mass in the right upper lobe.  It measured 4.6 x 6.7 x 4.2 cm.  There is satellite lesions.  There is mediastinal and right hilar lymph nodes.  CT of the abdomen pelvis showed retroperitoneal and left pelvic lymph nodes.  Is Dr. tried to get him to be admitted however he refused.  His family knows one of our patients.  They were told to come see [name] to get the best care.  He comes down with his wife and daughter.  He comes in a wheelchair.  He is pleasant but really does not have a comprehension as to what is going on.  There is been no obvious seizures.  He has had no bowel or bladder incontinence.  He says he has had no cough.  He has had no hemoptysis.  There is no visual changes.  He  is weak overall.  I really think that he is going had to be admitted so we can figure out what is going on with him.  There is no obvious history in the family of cancer.  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  Overall, I would say his performance status is ECOG 1.   Past Medical History:  Diagnosis Date  . Current smoker   . Diabetes ([place])   . HTN (hypertension)   . PAF (paroxysmal atrial fibrillation) ([place])    post CABG  . 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 Tobacco abuse   :  Past Surgical History:  Procedure Laterality Date  . CORONARY ARTERY BYPASS GRAFT N/A 03/30/2017   Procedure: CORONARY ARTERY BYPASS GRAFTING (CABG) x Three, using left internal mammary artery and right leg greater saphenous vein harvested endoscopically;  Surgeon: Rexene Alberts, MD;  Location: [place];  Service: Open Heart Surgery;  Laterality: N/A;  . LEFT HEART CATH AND CORONARY ANGIOGRAPHY N/A 03/29/2017   Procedure: LEFT HEART CATH  AND CORONARY ANGIOGRAPHY;  Surgeon: Lorretta Harp, MD;  Location: [place] [facility];  Service: Cardiovascular;  Laterality: N/A;  . TEE WITHOUT CARDIOVERSION N/A 03/30/2017   Procedure: TRANSESOPHAGEAL ECHOCARDIOGRAM (TEE);  Surgeon: Rexene Alberts, MD;  Location: [place];  Service: Open Heart Surgery;  Laterality: N/A;  :   Current Outpatient Medications:  .  acetaminophen (TYLENOL) 500 MG tablet, Take 2 tablets (1,000 mg total) by mouth every 6 (six) hours., Disp: 30 tablet, Rfl: 0 .  allopurinol (ZYLOPRIM) 300 MG tablet, Take 1 tablet (300 mg total) by mouth daily., Disp: 30 tablet, Rfl: 5 .  amLODipine (NORVASC) 5 MG tablet, Take 5 mg by mouth daily., Disp: , Rfl:  .  aspirin EC 81 MG tablet, Take 81 mg daily by mouth., Disp: , Rfl:  .  colchicine 0.6 MG tablet, Take 1 tablet by mouth 2 (two) times daily as needed., Disp: , Rfl:  .  glipiZIDE  (GLUCOTROL XL) 10 MG 24 hr tablet, Take 10 mg by mouth daily., Disp: , Rfl:  .  lidocaine (LIDODERM) 5 %, Place 1 patch onto the skin daily. Remove & Discard patch within 12 hours or as directed by MD, Disp: 30 patch, Rfl: 0 .  losartan (COZAAR) 50 MG tablet, Take 50 mg daily by mouth., Disp: , Rfl:  .  metFORMIN (GLUCOPHAGE) 1000 MG tablet, Take 1,000 mg 2 (two) times daily by mouth., Disp: , Rfl:  .  nitroGLYCERIN (NITROSTAT) 0.4 MG SL tablet, Place 1 tablet (0.4 mg total) under the tongue every 5 (five) minutes as needed for chest pain., Disp: 25 tablet, Rfl: 3 .  predniSONE (STERAPRED UNI-PAK 21 TAB) 5 MG (21) TBPK tablet, Take 6 pills first day; 5 pills second day; 4 pills third day; 3 pills fourth day; 2 pills next day and 1 pill last day., Disp: 21 tablet, Rfl: 0 .  rosuvastatin (CRESTOR) 5 MG tablet, TAKE 1 TABLET BY MOUTH  DAILY, Disp: 90 tablet, Rfl: 1 .  traMADol (ULTRAM) 50 MG tablet, Take 1 tablet (50 mg total) by mouth every 6 (six) hours as needed., Disp: 20 tablet, Rfl: 0:  :  Allergies  Allergen Reactions  . Atorvastatin     MUSCLE ACHES   :  Family History  Problem Relation Age of Onset  . Diabetes Mother   . Heart attack Mother   . Heart attack Father   :  Social History   Socioeconomic History  . Marital status: Married    Spouse name: Engineer, civil (consulting)  . Number of children: 3  . Years of education: 10  . Highest education level: 10th grade  Occupational History  . Not on file  Tobacco Use  . Smoking status: Current Some Day Smoker    Packs/day: 2.00    Years: 60.00    Pack years: 120.00    Types: Cigars    Last attempt to quit: 03/28/2017    Years since quitting: 3.4  . Smokeless tobacco: Never Used  . Tobacco comment: Pt states , "I will probably quit now."  Vaping Use  . Vaping Use: Never used  Substance and Sexual Activity  . Alcohol use: No  . Drug use: No  . Sexual activity: Yes  Other Topics Concern  . Not on file  Social History Narrative   . Not on file   Social Determinants of Health   Financial Resource Strain: Not on file  Food Insecurity: Not on file  Transportation Needs: Not on file  Physical Activity: Not on file  Stress: Not on file  Social Connections: Not on file  Intimate Partner Violence: Not on file  :  Review of Systems  Constitutional: Positive for weight loss.  HENT: Negative.   Eyes: Negative.   Respiratory: Positive for cough and shortness of breath.   Cardiovascular: Positive for palpitations.  Gastrointestinal: Positive for constipation and nausea.  Genitourinary: Negative.   Musculoskeletal: Positive for joint pain and myalgias.  Skin: Negative.   Neurological: Positive for dizziness and focal weakness.  Endo/Heme/Allergies: Negative.   Psychiatric/Behavioral: Negative.      Exam:  This is an elderly white male who is little bit disheveled.  He is pleasant but does have some disorientation.  His vital signs show temperature of 98.1.  Pulse is 88.  Blood pressure 126/44.  Head neck exam shows no ocular or oral lesions.  He has no thrush.  He has no obvious adenopathy in the neck.  Lungs are with some decreased over on the right side.  He has decent breath sounds bilaterally.  No wheezing is noted.  Cardiac exam regular rate and rhythm with no murmurs, rubs or bruits.  Abdomen is soft.  Bowel sounds are present.  There is no fluid wave.  There is no palpable liver or spleen tip.  Back exam shows no tenderness over the spine, ribs or hips.  Extremities shows no clubbing, cyanosis or edema.  Neurological exam shows no focal neurological deficits.  @IPVITALS @   Recent Labs    09/03/20 1018  WBC 16.4*  HGB 11.2*  HCT 34.4*  PLT 321   Recent Labs    09/03/20 1018  NA 135  K 4.4  CL 97*  CO2 30  GLUCOSE 177*  BUN 20  CREATININE 0.92  CALCIUM 10.1    Blood smear review: None  Pathology: None    Assessment and Plan: Bryce Powell is a 80 year old white male.  He apparently has  a large mass in the right lung.  I have to believe this is the primary.  He has mets to the brain.  I had to believe that were dealing with metastatic bronchogenic carcinoma.  I would suspect that this is probably going to be a squamous cell cancer or an adenocarcinoma.  We really need to get a diagnosis on him quickly.  He needs radiation therapy to the brain.  I will know if these tumors are small enough for stereotactic radiosurgery.  He needs steroids.  I suspect he probably will also need some Keppra.  He has not had any seizures yet.  However, there is swelling up in the brain.  I again I am not sure how much she really understands.  I have talked to him quite a bit in the office.  He just does not seem to have a comprehension as to what is truly going on.  His family is quite nice.  His family is certainly supportive.  They all know that we are dealing with a stage IV malignancy.  We can treat this but not cure this.  Our goal clearly is his quality of life.  We can try to treat the brain tumors, then we can try to prevent neurological dysfunction.  If we get biopsies, we will send the specimen off for molecular analysis to see if we might be able to utilize targeted therapy.  Again I am not sure what he really understands as to what is going on and how we have to address the  problem.  He will have to be admitted.  I am incredibly grateful that the Hospitalist at [facility] will admit him.  I truly, truly appreciate their help on this.  We will try to work through this as quickly as possible.  It would be nice to get an MRI of the brain.  I am not sure if he would fully cooperate for this however.  We may be able to get a percutaneous biopsy for the lung mass.  He needs to have a CT scan done locally so we can actually see what the tumor looks like.  This is quite complicated.  He does have diabetes.  We have to be careful with his blood sugars given that he will be on  steroids.  We will follow-up with him in the hospital.

## 2020-09-04 ENCOUNTER — Inpatient Hospital Stay (HOSPITAL_COMMUNITY): Payer: Medicare Other

## 2020-09-04 DIAGNOSIS — R918 Other nonspecific abnormal finding of lung field: Secondary | ICD-10-CM | POA: Diagnosis not present

## 2020-09-04 DIAGNOSIS — E0800 Diabetes mellitus due to underlying condition with hyperosmolarity without nonketotic hyperglycemic-hyperosmolar coma (NKHHC): Secondary | ICD-10-CM

## 2020-09-04 DIAGNOSIS — G936 Cerebral edema: Secondary | ICD-10-CM | POA: Diagnosis not present

## 2020-09-04 DIAGNOSIS — Z794 Long term (current) use of insulin: Secondary | ICD-10-CM | POA: Diagnosis not present

## 2020-09-04 LAB — BASIC METABOLIC PANEL
Anion gap: 10 (ref 5–15)
BUN: 22 mg/dL (ref 8–23)
CO2: 25 mmol/L (ref 22–32)
Calcium: 9.5 mg/dL (ref 8.9–10.3)
Chloride: 101 mmol/L (ref 98–111)
Creatinine, Ser: 0.81 mg/dL (ref 0.61–1.24)
GFR, Estimated: >60 mL/min (ref >60)
Glucose, Bld: 312 mg/dL — ABNORMAL HIGH (ref 70–99)
Potassium: 4.5 mmol/L (ref 3.5–5.1)
Sodium: 136 mmol/L (ref 135–145)

## 2020-09-04 LAB — CBC
HCT: 37.1 % — ABNORMAL LOW (ref 39.0–52.0)
Hemoglobin: 11.7 g/dL — ABNORMAL LOW (ref 13.0–17.0)
MCH: 27.9 pg (ref 26.0–34.0)
MCHC: 31.5 g/dL (ref 30.0–36.0)
MCV: 88.5 fL (ref 80.0–100.0)
Platelets: 310 10*3/uL (ref 150–400)
RBC: 4.19 MIL/uL — ABNORMAL LOW (ref 4.22–5.81)
RDW: 17.7 % — ABNORMAL HIGH (ref 11.5–15.5)
WBC: 10.9 10*3/uL — ABNORMAL HIGH (ref 4.0–10.5)
nRBC: 0 % (ref 0.0–0.2)

## 2020-09-04 LAB — SARS CORONAVIRUS 2 (TAT 6-24 HRS): SARS Coronavirus 2: NEGATIVE

## 2020-09-04 LAB — GLUCOSE, CAPILLARY
Glucose-Capillary: 325 mg/dL — ABNORMAL HIGH (ref 70–99)
Glucose-Capillary: 256 mg/dL — ABNORMAL HIGH (ref 70–99)
Glucose-Capillary: 333 mg/dL — ABNORMAL HIGH (ref 70–99)
Glucose-Capillary: 286 mg/dL — ABNORMAL HIGH (ref 70–99)

## 2020-09-04 MED ORDER — GADOBUTROL 1 MMOL/ML IV SOLN
9.0000 mL | Freq: Once | INTRAVENOUS | Status: AC | PRN
  Administered 2020-09-04: 9 mL via INTRAVENOUS

## 2020-09-04 MED ORDER — POLYETHYLENE GLYCOL 3350 17 G PO PACK
17.0000 g | PACK | Freq: Every day | ORAL | Status: DC
  Administered 2020-09-04 – 2020-09-05 (×2): 17 g via ORAL
  Filled 2020-09-04 (×2): qty 1

## 2020-09-04 MED ORDER — AMLODIPINE BESYLATE 5 MG PO TABS
5.0000 mg | ORAL_TABLET | Freq: Every day | ORAL | Status: DC
  Administered 2020-09-04 – 2020-09-06 (×3): 5 mg via ORAL
  Filled 2020-09-04 (×3): qty 1

## 2020-09-04 MED ORDER — DOCUSATE SODIUM 100 MG PO CAPS
100.0000 mg | ORAL_CAPSULE | Freq: Two times a day (BID) | ORAL | Status: DC
  Administered 2020-09-04 – 2020-09-05 (×4): 100 mg via ORAL
  Filled 2020-09-04 (×4): qty 1

## 2020-09-04 MED ORDER — NICOTINE 14 MG/24HR TD PT24
14.0000 mg | MEDICATED_PATCH | Freq: Every day | TRANSDERMAL | Status: DC
  Administered 2020-09-04 – 2020-09-06 (×3): 14 mg via TRANSDERMAL
  Filled 2020-09-04 (×3): qty 1

## 2020-09-04 NOTE — Progress Notes (Signed)
PROGRESS NOTE    Bryce Powell  HER:[id number] DOB: 1941/05/04 DOA: 09/03/2020 PCP: Candi Leash, PA-C    Brief Narrative:  80 yo male smoker, wt loss 80lbs in a year, ams on/off sent as referral to oncology office. ct brain recently shows couple tumors, ct chest lung mass right lung . Pt presented for further workup and evaluation after found to have new brain mets  Assessment & Plan:   Principal Problem:   Brain edema ([place]) Active Problems:   HTN (hypertension)   S/P CABG x 3   Diabetes ([place])  Multiple brain mets with edema -Pt has been continued on decadron scheduled q8h with keppra -Have discussed with Rad Onc who will see in consult -Currently neurologically intact  R Lung mass -Reviewed on chest imaging, likley new diagnosis of metastatic lung cancer -Appreciate input by Dr. Marin Olp -Have discussed with IR regarding possible biopsy. IR had recommended Pulmonary consult for bronch -Have consulted Pulmonary, will f/u on recs    HTN (hypertension)-will resume norvasc per home meds    S/P CABG x 3-hold aspirin at this time, presumably secondary to brain mets    Diabetes (HCC)-continue SSI coverage as needed  Tobacco abuse -cessation done at bedside -Will continue on nicotine patch as tolerated   DVT prophylaxis: SCD's Code Status: Full Family Communication: Pt in room, family is at bedsid  Status is: Inpatient  Remains inpatient appropriate because:Ongoing diagnostic testing needed not appropriate for outpatient work up and Inpatient level of care appropriate due to severity of illness   Dispo: The patient is from: Home              Anticipated d/c is to: Home              Patient currently is not medically stable to d/c.   Difficult to place patient No       Consultants:   Oncology  Pulmonary  Procedures:     Antimicrobials: Anti-infectives (From admission, onward)   None       Subjective: Denies sob, or chest pain. Eager to go  home  Objective: Vitals:   09/03/20 1851 09/03/20 2320 09/04/20 0334 09/04/20 1400  BP:  (!) 158/66 (!) 153/72 (!) 148/70  Pulse:  77 78 77  Resp:  14 14 18   Temp:  98.4 F (36.9 C) 98.1 F (36.7 C) 98.2 F (36.8 C)  TempSrc:  Oral Oral Oral  SpO2:  96% 96% 98%  Weight: 95 kg     Height: 6' (1.829 m)       Intake/Output Summary (Last 24 hours) at 09/04/2020 1745 Last data filed at 09/04/2020 1700 Gross per 24 hour  Intake 480 ml  Output 2675 ml  Net -2195 ml   Filed Weights   09/03/20 1851  Weight: 95 kg    Examination: General exam: Awake, laying in bed, in nad Respiratory system: Normal respiratory effort, no wheezing Cardiovascular system: regular rate, s1, s2 Gastrointestinal system: Soft, nondistended, positive BS Central nervous system: CN2-12 grossly intact, strength intact Extremities: Perfused, no clubbing Skin: Normal skin turgor, no notable skin lesions seen Psychiatry: Mood normal // no visual hallucinations   Data Reviewed: I have personally reviewed following labs and imaging studies  CBC: Recent Labs  Lab 09/03/20 1018 09/04/20 0544  WBC 16.4* 10.9*  NEUTROABS 12.8*  --   HGB 11.2* 11.7*  HCT 34.4* 37.1*  MCV 85.8 88.5  PLT 321 856   Basic Metabolic Panel: Recent Labs  Lab 09/03/20 1018 09/04/20  0544  NA 135 136  K 4.4 4.5  CL 97* 101  CO2 30 25  GLUCOSE 177* 312*  BUN 20 22  CREATININE 0.92 0.81  CALCIUM 10.1 9.5   GFR: Estimated Creatinine Clearance: 88.5 mL/min (by C-G formula based on SCr of 0.81 mg/dL). Liver Function Tests: Recent Labs  Lab 09/03/20 1018  AST 12*  ALT 15  ALKPHOS 69  BILITOT 0.4  PROT 7.5  ALBUMIN 3.8   No results for input(s): LIPASE, AMYLASE in the last 168 hours. No results for input(s): AMMONIA in the last 168 hours. Coagulation Profile: No results for input(s): INR, PROTIME in the last 168 hours. Cardiac Enzymes: No results for input(s): CKTOTAL, CKMB, CKMBINDEX, TROPONINI in the last 168  hours. BNP (last 3 results) No results for input(s): PROBNP in the last 8760 hours. HbA1C: Recent Labs    09/03/20 1645  HGBA1C 8.3*   CBG: Recent Labs  Lab 09/03/20 1755 09/03/20 2213 09/04/20 0739 09/04/20 1156 09/04/20 1652  GLUCAP 237* 232* 325* 256* 333*   Lipid Profile: No results for input(s): CHOL, HDL, LDLCALC, TRIG, CHOLHDL, LDLDIRECT in the last 72 hours. Thyroid Function Tests: No results for input(s): TSH, T4TOTAL, FREET4, T3FREE, THYROIDAB in the last 72 hours. Anemia Panel: No results for input(s): VITAMINB12, FOLATE, FERRITIN, TIBC, IRON, RETICCTPCT in the last 72 hours. Sepsis Labs: No results for input(s): PROCALCITON, LATICACIDVEN in the last 168 hours.  Recent Results (from the past 240 hour(s))  SARS CORONAVIRUS 2 (TAT 6-24 HRS) Nasopharyngeal Nasopharyngeal Swab     Status: None   Collection Time: 09/03/20  3:17 PM   Specimen: Nasopharyngeal Swab  Result Value Ref Range Status   SARS Coronavirus 2 NEGATIVE NEGATIVE Final    Comment: (NOTE) SARS-CoV-2 target nucleic acids are NOT DETECTED.  The SARS-CoV-2 RNA is generally detectable in upper and lower respiratory specimens during the acute phase of infection. Negative results do not preclude SARS-CoV-2 infection, do not rule out co-infections with other pathogens, and should not be used as the sole basis for treatment or other patient management decisions. Negative results must be combined with clinical observations, patient history, and epidemiological information. The expected result is Negative.  Fact Sheet for Patients: SugarRoll.be  Fact Sheet for Healthcare Providers: https://www.woods-mathews.com/  This test is not yet approved or cleared by the [place] FDA and  has been authorized for detection and/or diagnosis of SARS-CoV-2 by FDA under an Emergency Use Authorization (EUA). This EUA will remain  in effect (meaning this test can be used)  for the duration of the COVID-19 declaration under Se ction 564(b)(1) of the Act, 21 U.S.C. section 360bbb-3(b)(1), unless the authorization is terminated or revoked sooner.  Performed at [facility], [place] [address] [place], [place] [address]      Radiology Studies: MR BRAIN W WO CONTRAST  Result Date: 09/04/2020 CLINICAL DATA:  CNS neoplasm staging EXAM: MRI HEAD WITHOUT AND WITH CONTRAST TECHNIQUE: Multiplanar, multiecho pulse sequences of the brain and surrounding structures were obtained without and with intravenous contrast. CONTRAST:  33mL GADAVIST GADOBUTROL 1 MMOL/ML IV SOLN COMPARISON:  None. FINDINGS: Brain: 2 similar heterogeneously enhancing left cerebral masses with confluent vasogenic edema spreading throughout the left temporal lobe, parietal lobe, and deep white matter tracks. There is local mass effect with 6 mm midline shift. Largest lesion measures 3.2 cm in the inferior and posterior left temporal lobe with peritumoral cyst posteriorly. The smaller is in the left occipital lobe and measures 14 mm. Small  remote bilateral cerebellar infarct. Chronic small vessel ischemia in the cerebral white matter. Vascular: Normal flow voids and vascular enhancements Skull and upper cervical spine: Normal marrow signal Sinuses/Orbits: Bilateral cataract resection. IMPRESSION: Two left cerebral masses consistent with metastatic disease, the larger measuring 3.2 cm in the posterior temporal lobe. Extensive regional vasogenic edema with 6 mm of midline shift. Electronically Signed   By: Monte Fantasia M.D.   On: 09/04/2020 11:40   CT CHEST ABDOMEN PELVIS W CONTRAST  Result Date: 09/03/2020 CLINICAL DATA:  Brain metastases of unknown primary. EXAM: CT CHEST, ABDOMEN, AND PELVIS WITH CONTRAST TECHNIQUE: Multidetector CT imaging of the chest, abdomen and pelvis was performed following the standard protocol during bolus administration of intravenous contrast. CONTRAST:  17mL OMNIPAQUE IOHEXOL  300 MG/ML  SOLN COMPARISON:  None. FINDINGS: CT CHEST FINDINGS Cardiovascular: No acute findings.  Prior CABG noted. Mediastinum/Lymph Nodes: Mild mediastinal lymphadenopathy is seen in the right paratracheal region, with largest lymph node measuring 1.5 cm. Mild right hilar lymphadenopathy is also seen with largest lymph node measuring 1.7 cm. Lungs/Pleura: A masslike opacity is seen in the central and anterior right upper lobe which measures approximately 4.9 by 3.4 cm on image 21/2. Postobstructive atelectasis or pneumonitis is also seen in the anterior right upper lobe. A 9 mm pulmonary nodule is also seen in the lateral and inferior right upper lobe abutting the minor fissure on image 53/4. No evidence pleural effusion. Musculoskeletal:  No suspicious bone lesions identified. CT ABDOMEN AND PELVIS FINDINGS Hepatobiliary: No masses identified. Gallbladder is unremarkable. No evidence of biliary ductal dilatation. Pancreas:  No mass or inflammatory changes. Spleen:  Within normal limits in size and appearance. Adrenals/Urinary tract: Normal adrenal glands. A few small renal cysts are noted, however there is no evidence of renal masses or hydronephrosis. Masses or hydronephrosis. Stomach/Bowel: No evidence of obstruction, inflammatory process, or abnormal fluid collections. Normal appendix visualized. Diverticulosis is seen mainly involving the sigmoid colon, however there is no evidence of diverticulitis. Vascular/Lymphatic: No pathologically enlarged lymph nodes identified. Aortic atherosclerotic calcification noted. 3.3 cm infrarenal abdominal aortic aneurysm is seen. Reproductive:  No mass or other significant abnormality identified. Other:  None. Musculoskeletal:  No suspicious bone lesions identified. IMPRESSION: Mass in the central and anterior right upper lobe measuring approximately 5 cm, with adjacent postobstructive atelectasis or pneumonitis. Differential diagnosis includes primary bronchogenic  carcinoma and metastatic disease. Separate 9 mm pulmonary nodule in lateral right upper lobe adjacent to minor fissure, suspicious for synchronous primary carcinoma or metastatic disease. Mild right hilar and mediastinal lymphadenopathy, consistent with metastatic disease. No evidence of abdominal or pelvic metastatic disease. 3.3 cm infrarenal abdominal aortic aneurysm. Consider follow-up ultrasound every 3 years if appropriate. This recommendation follows ACR consensus guidelines: White Paper of the ACR Incidental Findings Committee II on Vascular Findings. J Am Coll Radiol 2013; 10:789-794. Aortic Atherosclerosis (ICD10-I70.0). Electronically Signed   By: Marlaine Hind M.D.   On: 09/03/2020 20:43    Scheduled Meds: . dexamethasone (DECADRON) injection  10 mg Intravenous Q8H  . docusate sodium  100 mg Oral BID  . insulin aspart  0-9 Units Subcutaneous TID WC  . levETIRAcetam  500 mg Oral BID  . nicotine  14 mg Transdermal Daily  . polyethylene glycol  17 g Oral Daily  . sodium chloride flush  3 mL Intravenous Q12H   Continuous Infusions: . sodium chloride       LOS: 1 day   Marylu Lund, MD [facility] Pager On Amion  If 7PM-7AM, please contact night-coverage 09/04/2020, 5:45 PM

## 2020-09-04 NOTE — Consult Note (Signed)
NAME:  Bryce Powell, MRN:  [id number], DOB:  [birth year], LOS: 2 ADMISSION DATE:  09/03/2020, CONSULTATION DATE:  04/24./22 REFERRING MD:  DR Wyline Copas, CHIEF COMPLAINT:  Lung mass and brain mets   History of Present Illness:   History is provided by review of the records, little bit by the patient, his wife and daughter at the bedside  80 year old male smoker.  No prior history of lung lung cancer screening.  80 pound weight loss in 1 year and intermittent headaches and mental confusion.  No history of hemoptysis.  No chronic blood loss or bleeding.  No shortness of breath.  Reports decline in memory for 1 month and chronic cough.  And then the weight loss.  CT scan of the chest shows nearly 5 cm right upper lobe mass [CT scan personally visualized and right upper lobe airway leading directly into the mass] that appears somewhat necrotic and CCM personal visualization opinion.  CT scan brain showed several metastatic lesions according to the hospitalist and Decadron has been started.  Keppra also started.  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  CT scan chest also shows possible mediastinal adenopathy.  No PET scan available because he is inpatient.  According to the hospitalist interventional radiology recommended bronchoscopy method of biopsy.  Therefore pulmonary has been consulted.  Patient is itching to go home.  At home he is not using any oxygen.  No specific diagnosis of COPD although he is a heavy smoker.  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   reports that he has been smoking cigars. He has a 120.00 pack-year smoking history. He has never used smokeless tobacco.  Past Surgical History:  Procedure  Laterality Date  . CORONARY ARTERY BYPASS GRAFT N/A 03/30/2017   Procedure: CORONARY ARTERY BYPASS GRAFTING (CABG) x Three, using left internal mammary artery and right leg greater saphenous vein harvested endoscopically;  Surgeon: Rexene Alberts, MD;  Location: [facility];  Service: Open Heart Surgery;  Laterality: N/A;  . LEFT HEART CATH AND CORONARY ANGIOGRAPHY N/A 03/29/2017   Procedure: LEFT HEART CATH AND CORONARY ANGIOGRAPHY;  Surgeon: Lorretta Harp, MD;  Location: [facility];  Service: Cardiovascular;  Laterality: N/A;  . TEE WITHOUT CARDIOVERSION N/A 03/30/2017   Procedure: TRANSESOPHAGEAL ECHOCARDIOGRAM (TEE);  Surgeon: Rexene Alberts, MD;  Location: [facility];  Service: Open Heart Surgery;  Laterality: N/A;    Allergies  Allergen Reactions  . Atorvastatin     MUSCLE ACHES     There is no immunization history for the selected administration types on file for this patient.  Family History  Problem Relation Age of Onset  . Diabetes Mother   . Heart attack Mother   . Heart attack Father      Current Facility-Administered Medications:  .  0.9 %  sodium chloride infusion, 250 mL, Intravenous, PRN, Derrill Kay A, MD .  amLODipine (NORVASC) tablet 5 mg, 5 mg, Oral, Daily, Donne Hazel, MD, 5 mg at 09/05/20 1106 .  dexamethasone (DECADRON) injection 10 mg, 10 mg, Intravenous, Q8H, Derrill Kay A, MD, 10 mg at 09/05/20 0907 .  docusate sodium (COLACE) capsule 100 mg, 100 mg, Oral, BID, Donne Hazel, MD,  100 mg at 09/05/20 1106 .  insulin aspart (novoLOG) injection 0-9 Units, 0-9 Units, Subcutaneous, TID WC, Phillips Grout, MD, 7 Units at 09/05/20 [phone] .  levETIRAcetam (KEPPRA) tablet 500 mg, 500 mg, Oral, BID, Derrill Kay A, MD, 500 mg at 09/05/20 1107 .  nicotine (NICODERM CQ - dosed in mg/24 hours) patch 14 mg, 14 mg, Transdermal, Daily, Donne Hazel, MD, 14 mg at 09/05/20 1107 .  polyethylene glycol (MIRALAX / GLYCOLAX) packet 17 g, 17 g, Oral, Daily, Donne Hazel, MD, 17 g at 09/05/20 1106 .  sodium chloride flush (NS) 0.9 % injection 3 mL, 3 mL, Intravenous, Q12H, Derrill Kay A, MD, 3 mL at 09/05/20 1108 .  sodium chloride flush (NS) 0.9 % injection 3 mL, 3 mL, Intravenous, PRN, Phillips Grout, MD, 3 mL at 09/03/20 2134   Significant Hospital Events: Including procedures, antibiotic start and stop dates in addition to other pertinent events   .   Interim History / Subjective:  09/05/2020: Seen at the bedside in bed 1621 at [facility]  Objective   Blood pressure 140/66, pulse 62, temperature (!) 97.3 F (36.3 C), temperature source Oral, resp. rate 18, height 6' (1.829 m), weight 95 kg, SpO2 95 %.        Intake/Output Summary (Last 24 hours) at 09/05/2020 1112 Last data filed at 09/05/2020 0756 Gross per 24 hour  Intake 480 ml  Output 2200 ml  Net -1720 ml   Filed Weights   09/03/20 1851  Weight: 95 kg    Examination: General: Somewhat frail but pleasant elderly gentleman.  Looks somewhat disheveled HENT: No elevated JVP.  No neck nodes Lungs: Mild barrel chest.  Expiration prolonged.  No wheezing no crackles  cardiovascular: Regular rate and rhythm Abdomen: Soft nontender no organomegaly Extremities: No cyanosis.  No edema.  Clubbing present Neuro: Has a wobbly gait but is able to walk.  This is baseline.  Alert and oriented x3. GU: Looks normal  Labs/imaging that I havepersonally reviewed  (right click and "Reselect all SmartList Selections" daily)    LABS    PULMONARY No results for input(s): PHART, PCO2ART, PO2ART, HCO3, TCO2, O2SAT in the last 168 hours.  Invalid input(s): PCO2, PO2  CBC Recent Labs  Lab 09/03/20 1018 09/04/20 0544  HGB 11.2* 11.7*  HCT 34.4* 37.1*  WBC 16.4* 10.9*  PLT 321 310    COAGULATION Recent Labs  Lab 09/05/20 0556  INR 1.0    CARDIAC  No results for input(s): TROPONINI in the last 168 hours. No results for input(s): PROBNP in the last 168  hours.   CHEMISTRY Recent Labs  Lab 09/03/20 1018 09/04/20 0544  NA 135 136  K 4.4 4.5  CL 97* 101  CO2 30 25  GLUCOSE 177* 312*  BUN 20 22  CREATININE 0.92 0.81  CALCIUM 10.1 9.5   Estimated Creatinine Clearance: 88.5 mL/min (by C-G formula based on SCr of 0.81 mg/dL).   LIVER Recent Labs  Lab 09/03/20 1018 09/05/20 0556  AST 12*  --   ALT 15  --   ALKPHOS 69  --   BILITOT 0.4  --   PROT 7.5  --   ALBUMIN 3.8  --   INR  --  1.0     INFECTIOUS No results for input(s): LATICACIDVEN, PROCALCITON in the last 168 hours.   ENDOCRINE CBG (last 3)  Recent Labs    09/04/20 1652 09/04/20 2152 09/05/20 0753  GLUCAP 333* 286*  340*         IMAGING x48h  - image(s) personally visualized  -   highlighted in bold MR BRAIN W WO CONTRAST  Result Date: 09/04/2020 CLINICAL DATA:  CNS neoplasm staging EXAM: MRI HEAD WITHOUT AND WITH CONTRAST TECHNIQUE: Multiplanar, multiecho pulse sequences of the brain and surrounding structures were obtained without and with intravenous contrast. CONTRAST:  72mL GADAVIST GADOBUTROL 1 MMOL/ML IV SOLN COMPARISON:  None. FINDINGS: Brain: 2 similar heterogeneously enhancing left cerebral masses with confluent vasogenic edema spreading throughout the left temporal lobe, parietal lobe, and deep white matter tracks. There is local mass effect with 6 mm midline shift. Largest lesion measures 3.2 cm in the inferior and posterior left temporal lobe with peritumoral cyst posteriorly. The smaller is in the left occipital lobe and measures 14 mm. Small remote bilateral cerebellar infarct. Chronic small vessel ischemia in the cerebral white matter. Vascular: Normal flow voids and vascular enhancements Skull and upper cervical spine: Normal marrow signal Sinuses/Orbits: Bilateral cataract resection. IMPRESSION: Two left cerebral masses consistent with metastatic disease, the larger measuring 3.2 cm in the posterior temporal lobe. Extensive regional vasogenic  edema with 6 mm of midline shift. Electronically Signed   By: Monte Fantasia M.D.   On: 09/04/2020 11:40   CT CHEST ABDOMEN PELVIS W CONTRAST  Result Date: 09/03/2020 CLINICAL DATA:  Brain metastases of unknown primary. EXAM: CT CHEST, ABDOMEN, AND PELVIS WITH CONTRAST TECHNIQUE: Multidetector CT imaging of the chest, abdomen and pelvis was performed following the standard protocol during bolus administration of intravenous contrast. CONTRAST:  137mL OMNIPAQUE IOHEXOL 300 MG/ML  SOLN COMPARISON:  None. FINDINGS: CT CHEST FINDINGS Cardiovascular: No acute findings.  Prior CABG noted. Mediastinum/Lymph Nodes: Mild mediastinal lymphadenopathy is seen in the right paratracheal region, with largest lymph node measuring 1.5 cm. Mild right hilar lymphadenopathy is also seen with largest lymph node measuring 1.7 cm. Lungs/Pleura: A masslike opacity is seen in the central and anterior right upper lobe which measures approximately 4.9 by 3.4 cm on image 21/2. Postobstructive atelectasis or pneumonitis is also seen in the anterior right upper lobe. A 9 mm pulmonary nodule is also seen in the lateral and inferior right upper lobe abutting the minor fissure on image 53/4. No evidence pleural effusion. Musculoskeletal:  No suspicious bone lesions identified. CT ABDOMEN AND PELVIS FINDINGS Hepatobiliary: No masses identified. Gallbladder is unremarkable. No evidence of biliary ductal dilatation. Pancreas:  No mass or inflammatory changes. Spleen:  Within normal limits in size and appearance. Adrenals/Urinary tract: Normal adrenal glands. A few small renal cysts are noted, however there is no evidence of renal masses or hydronephrosis. Masses or hydronephrosis. Stomach/Bowel: No evidence of obstruction, inflammatory process, or abnormal fluid collections. Normal appendix visualized. Diverticulosis is seen mainly involving the sigmoid colon, however there is no evidence of diverticulitis. Vascular/Lymphatic: No pathologically  enlarged lymph nodes identified. Aortic atherosclerotic calcification noted. 3.3 cm infrarenal abdominal aortic aneurysm is seen. Reproductive:  No mass or other significant abnormality identified. Other:  None. Musculoskeletal:  No suspicious bone lesions identified. IMPRESSION: Mass in the central and anterior right upper lobe measuring approximately 5 cm, with adjacent postobstructive atelectasis or pneumonitis. Differential diagnosis includes primary bronchogenic carcinoma and metastatic disease. Separate 9 mm pulmonary nodule in lateral right upper lobe adjacent to minor fissure, suspicious for synchronous primary carcinoma or metastatic disease. Mild right hilar and mediastinal lymphadenopathy, consistent with metastatic disease. No evidence of abdominal or pelvic metastatic disease. 3.3 cm infrarenal abdominal aortic aneurysm. Consider follow-up ultrasound  every 3 years if appropriate. This recommendation follows ACR consensus guidelines: White Paper of the ACR Incidental Findings Committee II on Vascular Findings. J Am Coll Radiol 2013; 10:789-794. Aortic Atherosclerosis (ICD10-I70.0). Electronically Signed   By: Marlaine Hind M.D.   On: 09/03/2020 20:43     Resolved Hospital Problem list   x  Assessment & Plan:   Right upper lobe 4.9 cm lung mass with necrotic features.  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 Benefits of diagnosis but limitations of non-diagnosis also explained. Patient verbalized understanding and wished to proceed.   -Informed Dr. Elsworth Soho will be the inpatient pulmonologist for  09/06/2020  -Placed order to pulmonary triage just to help schedule the procedure ideally for 09/06/2020 Monday  -Check arterial blood gas  -N.p.o. except meds starting 2 AM 09/06/2020  -Continue Decadron and Keppra for cerebral mets according to the hospitalist  -Hold any anticoagulation for potential procedure 09/06/2020 [do not see any on the list]   Likely COPD  -Get inpatient pulmonary function test  -Start long-acting anticholinergic with albuterol as needed  -Check ABG  sMoker  -[id number] to quit  Best practice (right click and "Reselect all SmartList Selections" daily)  Per [facility]ad    SIGNATURE    Dr. Brand Males, M.D., F.C.C.P,  Pulmonary and Critical Care Medicine Staff Physician, [place] Director - Interstitial Lung Disease  Program  Pulmonary [place] at [place], [place], [address]  Pager: [phone], If no answer  OR between  19:00-7:00h: page [phone] Telephone (clinical office): [phone] Telephone (research): [phone]  11:12 AM 09/05/2020

## 2020-09-05 ENCOUNTER — Telehealth: Payer: Self-pay | Admitting: Internal Medicine

## 2020-09-05 DIAGNOSIS — G939 Disorder of brain, unspecified: Secondary | ICD-10-CM | POA: Diagnosis not present

## 2020-09-05 DIAGNOSIS — G936 Cerebral edema: Secondary | ICD-10-CM | POA: Diagnosis not present

## 2020-09-05 DIAGNOSIS — Z794 Long term (current) use of insulin: Secondary | ICD-10-CM | POA: Diagnosis not present

## 2020-09-05 DIAGNOSIS — E0800 Diabetes mellitus due to underlying condition with hyperosmolarity without nonketotic hyperglycemic-hyperosmolar coma (NKHHC): Secondary | ICD-10-CM | POA: Diagnosis not present

## 2020-09-05 LAB — BLOOD GAS, ARTERIAL
Acid-base deficit: 0.9 mmol/L (ref 0.0–2.0)
Bicarbonate: 22.3 mmol/L (ref 20.0–28.0)
FIO2: 21
O2 Saturation: 96.5 %
Patient temperature: 98.6
pCO2 arterial: 33.6 mmHg (ref 32.0–48.0)
pH, Arterial: 7.438 (ref 7.350–7.450)
pO2, Arterial: 93.3 mmHg (ref 83.0–108.0)

## 2020-09-05 LAB — GLUCOSE, CAPILLARY
Glucose-Capillary: 340 mg/dL — ABNORMAL HIGH (ref 70–99)
Glucose-Capillary: 411 mg/dL — ABNORMAL HIGH (ref 70–99)
Glucose-Capillary: 289 mg/dL — ABNORMAL HIGH (ref 70–99)
Glucose-Capillary: 319 mg/dL — ABNORMAL HIGH (ref 70–99)

## 2020-09-05 LAB — GLUCOSE, RANDOM: Glucose, Bld: 460 mg/dL — ABNORMAL HIGH (ref 70–99)

## 2020-09-05 LAB — PROTIME-INR
INR: 1 (ref 0.8–1.2)
Prothrombin Time: 13.6 seconds (ref 11.4–15.2)

## 2020-09-05 MED ORDER — NEPRO/CARBSTEADY PO LIQD
237.0000 mL | Freq: Three times a day (TID) | ORAL | Status: DC
  Administered 2020-09-05 (×2): 237 mL via ORAL
  Filled 2020-09-05 (×5): qty 237

## 2020-09-05 MED ORDER — INSULIN ASPART 100 UNIT/ML ~~LOC~~ SOLN
0.0000 [IU] | Freq: Every day | SUBCUTANEOUS | Status: DC
  Administered 2020-09-05: 4 [IU] via SUBCUTANEOUS

## 2020-09-05 MED ORDER — ALBUTEROL SULFATE HFA 108 (90 BASE) MCG/ACT IN AERS
2.0000 | INHALATION_SPRAY | Freq: Four times a day (QID) | RESPIRATORY_TRACT | Status: DC | PRN
  Filled 2020-09-05: qty 6.7

## 2020-09-05 MED ORDER — REVEFENACIN 175 MCG/3ML IN SOLN
175.0000 ug | Freq: Every day | RESPIRATORY_TRACT | Status: DC
  Administered 2020-09-06: 175 ug via RESPIRATORY_TRACT
  Filled 2020-09-05 (×2): qty 3

## 2020-09-05 MED ORDER — INSULIN ASPART 100 UNIT/ML ~~LOC~~ SOLN
3.0000 [IU] | Freq: Three times a day (TID) | SUBCUTANEOUS | Status: DC
  Administered 2020-09-05: 3 [IU] via SUBCUTANEOUS

## 2020-09-05 MED ORDER — ADULT MULTIVITAMIN W/MINERALS CH: 1.0000 | ORAL_TABLET | Freq: Every day | ORAL | Status: DC

## 2020-09-05 MED ORDER — ENSURE ENLIVE PO LIQD: 237.0000 mL | Freq: Three times a day (TID) | ORAL | Status: DC

## 2020-09-05 MED ORDER — INSULIN ASPART 100 UNIT/ML ~~LOC~~ SOLN
10.0000 [IU] | Freq: Once | SUBCUTANEOUS | Status: AC
  Administered 2020-09-05: 10 [IU] via SUBCUTANEOUS

## 2020-09-05 MED ORDER — INSULIN ASPART 100 UNIT/ML ~~LOC~~ SOLN
0.0000 [IU] | Freq: Three times a day (TID) | SUBCUTANEOUS | Status: DC
  Administered 2020-09-05: 11 [IU] via SUBCUTANEOUS
  Administered 2020-09-06: 4 [IU] via SUBCUTANEOUS
  Administered 2020-09-06: 7 [IU] via SUBCUTANEOUS
  Administered 2020-09-06: 15 [IU] via SUBCUTANEOUS

## 2020-09-05 MED ORDER — INSULIN GLARGINE 100 UNIT/ML ~~LOC~~ SOLN
10.0000 [IU] | Freq: Every day | SUBCUTANEOUS | Status: DC
  Administered 2020-09-05 – 2020-09-06 (×2): 10 [IU] via SUBCUTANEOUS
  Filled 2020-09-05 (×2): qty 0.1

## 2020-09-05 NOTE — Progress Notes (Signed)
PROGRESS NOTE    Bryce Powell  WRU:[id number] DOB: [birth year] DOA: 09/03/2020 PCP: Candi Leash, PA-C    Brief Narrative:  80 yo male smoker, wt loss 80lbs in a year, ams on/off sent as referral to oncology office. ct brain recently shows couple tumors, ct chest lung mass right lung . Pt presented for further workup and evaluation after found to have new brain mets  Assessment & Plan:   Principal Problem:   Brain edema ([place]) Active Problems:   HTN (hypertension)   S/P CABG x 3   Diabetes ([place])  Multiple brain mets with edema -Pt has been continued on decadron scheduled q8h with keppra -Have earlier discussed with Rad Onc who will see in consult -remains neurologically intact  R Lung mass -Reviewed on chest imaging, likley new diagnosis of metastatic lung cancer -Appreciate input by Dr. Marin Olp -Have discussed with IR regarding possible biopsy. IR had recommended Pulmonary consult for bronch -Appreciate assistance by Pulmonary. Plan for bronch tomorrow for biopsy    HTN (hypertension)-continued on norvasc per home meds    S/P CABG x 3-hold aspirin at this time, presumably secondary to brain mets    Diabetes ([place]) -Currently very poorly controlled secondary to concurrent high dosed steroids -Have started daily lantus 10 units and 3 units meal coverage -Have increased SSI coverage to resistant scale  Tobacco abuse -cessation done at bedside -Cont nicotine patch as tolerated   DVT prophylaxis: SCD's Code Status: Full Family Communication: Pt in room, family is at bedsid  Status is: Inpatient  Remains inpatient appropriate because:Ongoing diagnostic testing needed not appropriate for outpatient work up and Inpatient level of care appropriate due to severity of illness  Dispo: The patient is from: Home              Anticipated d/c is to: Home              Patient currently is not medically stable to d/c.   Difficult to place patient No   Consultants:    Oncology  Pulmonary  Radiation Oncology  Procedures:     Antimicrobials: Anti-infectives (From admission, onward)   None      Subjective: Denies sob, still wanting to go home  Objective: Vitals:   09/04/20 1400 09/05/20 0406 09/05/20 1106 09/05/20 1306  BP: (!) 148/70 (!) 135/58 140/66 (!) 149/60  Pulse: 77 62  65  Resp: 18 18  15   Temp: 98.2 F (36.8 C) (!) 97.3 F (36.3 C)  98.3 F (36.8 C)  TempSrc: Oral Oral  Oral  SpO2: 98% 95%  96%  Weight:      Height:        Intake/Output Summary (Last 24 hours) at 09/05/2020 1451 Last data filed at 09/05/2020 1300 Gross per 24 hour  Intake 480 ml  Output 2740 ml  Net -2260 ml   Filed Weights   09/03/20 1851  Weight: 95 kg    Examination: General exam: Conversant, in no acute distress Respiratory system: normal chest rise, clear, no audible wheezing Cardiovascular system: regular rhythm, s1-s2 Gastrointestinal system: Nondistended, nontender, pos BS Central nervous system: No seizures, no tremors Extremities: No cyanosis, no joint deformities Skin: No rashes, no pallor Psychiatry: Affect normal // no auditory hallucinations   Data Reviewed: I have personally reviewed following labs and imaging studies  CBC: Recent Labs  Lab 09/03/20 1018 09/04/20 0544  WBC 16.4* 10.9*  NEUTROABS 12.8*  --   HGB 11.2* 11.7*  HCT 34.4* 37.1*  MCV 85.8 88.5  PLT 321 382   Basic Metabolic Panel: Recent Labs  Lab 09/03/20 1018 09/04/20 0544 09/05/20 1343  NA 135 136  --   K 4.4 4.5  --   CL 97* 101  --   CO2 30 25  --   GLUCOSE 177* 312* 460*  BUN 20 22  --   CREATININE 0.92 0.81  --   CALCIUM 10.1 9.5  --    GFR: Estimated Creatinine Clearance: 88.5 mL/min (by C-G formula based on SCr of 0.81 mg/dL). Liver Function Tests: Recent Labs  Lab 09/03/20 1018  AST 12*  ALT 15  ALKPHOS 69  BILITOT 0.4  PROT 7.5  ALBUMIN 3.8   No results for input(s): LIPASE, AMYLASE in the last 168 hours. No results for  input(s): AMMONIA in the last 168 hours. Coagulation Profile: Recent Labs  Lab 09/05/20 0556  INR 1.0   Cardiac Enzymes: No results for input(s): CKTOTAL, CKMB, CKMBINDEX, TROPONINI in the last 168 hours. BNP (last 3 results) No results for input(s): PROBNP in the last 8760 hours. HbA1C: Recent Labs    09/03/20 1645  HGBA1C 8.3*   CBG: Recent Labs  Lab 09/04/20 1156 09/04/20 1652 09/04/20 2152 09/05/20 0753 09/05/20 1220  GLUCAP 256* 333* 286* 340* 411*   Lipid Profile: No results for input(s): CHOL, HDL, LDLCALC, TRIG, CHOLHDL, LDLDIRECT in the last 72 hours. Thyroid Function Tests: No results for input(s): TSH, T4TOTAL, FREET4, T3FREE, THYROIDAB in the last 72 hours. Anemia Panel: No results for input(s): VITAMINB12, FOLATE, FERRITIN, TIBC, IRON, RETICCTPCT in the last 72 hours. Sepsis Labs: No results for input(s): PROCALCITON, LATICACIDVEN in the last 168 hours.  Recent Results (from the past 240 hour(s))  SARS CORONAVIRUS 2 (TAT 6-24 HRS) Nasopharyngeal Nasopharyngeal Swab     Status: None   Collection Time: 09/03/20  3:17 PM   Specimen: Nasopharyngeal Swab  Result Value Ref Range Status   SARS Coronavirus 2 NEGATIVE NEGATIVE Final    Comment: (NOTE) SARS-CoV-2 target nucleic acids are NOT DETECTED.  The SARS-CoV-2 RNA is generally detectable in upper and lower respiratory specimens during the acute phase of infection. Negative results do not preclude SARS-CoV-2 infection, do not rule out co-infections with other pathogens, and should not be used as the sole basis for treatment or other patient management decisions. Negative results must be combined with clinical observations, patient history, and epidemiological information. The expected result is Negative.  Fact Sheet for Patients: SugarRoll.be  Fact Sheet for Healthcare Providers: https://www.woods-mathews.com/  This test is not yet approved or cleared by the  [place] FDA and  has been authorized for detection and/or diagnosis of SARS-CoV-2 by FDA under an Emergency Use Authorization (EUA). This EUA will remain  in effect (meaning this test can be used) for the duration of the COVID-19 declaration under Se ction 564(b)(1) of the Act, 21 U.S.C. section 360bbb-3(b)(1), unless the authorization is terminated or revoked sooner.  Performed at [facility], [place] [address], [place], [place] [address]      Radiology Studies: MR BRAIN W WO CONTRAST  Result Date: 09/04/2020 CLINICAL DATA:  CNS neoplasm staging EXAM: MRI HEAD WITHOUT AND WITH CONTRAST TECHNIQUE: Multiplanar, multiecho pulse sequences of the brain and surrounding structures were obtained without and with intravenous contrast. CONTRAST:  86mL GADAVIST GADOBUTROL 1 MMOL/ML IV SOLN COMPARISON:  None. FINDINGS: Brain: 2 similar heterogeneously enhancing left cerebral masses with confluent vasogenic edema spreading throughout the left temporal lobe, parietal lobe, and deep white matter tracks. There  is local mass effect with 6 mm midline shift. Largest lesion measures 3.2 cm in the inferior and posterior left temporal lobe with peritumoral cyst posteriorly. The smaller is in the left occipital lobe and measures 14 mm. Small remote bilateral cerebellar infarct. Chronic small vessel ischemia in the cerebral white matter. Vascular: Normal flow voids and vascular enhancements Skull and upper cervical spine: Normal marrow signal Sinuses/Orbits: Bilateral cataract resection. IMPRESSION: Two left cerebral masses consistent with metastatic disease, the larger measuring 3.2 cm in the posterior temporal lobe. Extensive regional vasogenic edema with 6 mm of midline shift. Electronically Signed   By: Monte Fantasia M.D.   On: 09/04/2020 11:40   CT CHEST ABDOMEN PELVIS W CONTRAST  Result Date: 09/03/2020 CLINICAL DATA:  Brain metastases of unknown primary. EXAM: CT CHEST, ABDOMEN, AND PELVIS WITH  CONTRAST TECHNIQUE: Multidetector CT imaging of the chest, abdomen and pelvis was performed following the standard protocol during bolus administration of intravenous contrast. CONTRAST:  166mL OMNIPAQUE IOHEXOL 300 MG/ML  SOLN COMPARISON:  None. FINDINGS: CT CHEST FINDINGS Cardiovascular: No acute findings.  Prior CABG noted. Mediastinum/Lymph Nodes: Mild mediastinal lymphadenopathy is seen in the right paratracheal region, with largest lymph node measuring 1.5 cm. Mild right hilar lymphadenopathy is also seen with largest lymph node measuring 1.7 cm. Lungs/Pleura: A masslike opacity is seen in the central and anterior right upper lobe which measures approximately 4.9 by 3.4 cm on image 21/2. Postobstructive atelectasis or pneumonitis is also seen in the anterior right upper lobe. A 9 mm pulmonary nodule is also seen in the lateral and inferior right upper lobe abutting the minor fissure on image 53/4. No evidence pleural effusion. Musculoskeletal:  No suspicious bone lesions identified. CT ABDOMEN AND PELVIS FINDINGS Hepatobiliary: No masses identified. Gallbladder is unremarkable. No evidence of biliary ductal dilatation. Pancreas:  No mass or inflammatory changes. Spleen:  Within normal limits in size and appearance. Adrenals/Urinary tract: Normal adrenal glands. A few small renal cysts are noted, however there is no evidence of renal masses or hydronephrosis. Masses or hydronephrosis. Stomach/Bowel: No evidence of obstruction, inflammatory process, or abnormal fluid collections. Normal appendix visualized. Diverticulosis is seen mainly involving the sigmoid colon, however there is no evidence of diverticulitis. Vascular/Lymphatic: No pathologically enlarged lymph nodes identified. Aortic atherosclerotic calcification noted. 3.3 cm infrarenal abdominal aortic aneurysm is seen. Reproductive:  No mass or other significant abnormality identified. Other:  None. Musculoskeletal:  No suspicious bone lesions  identified. IMPRESSION: Mass in the central and anterior right upper lobe measuring approximately 5 cm, with adjacent postobstructive atelectasis or pneumonitis. Differential diagnosis includes primary bronchogenic carcinoma and metastatic disease. Separate 9 mm pulmonary nodule in lateral right upper lobe adjacent to minor fissure, suspicious for synchronous primary carcinoma or metastatic disease. Mild right hilar and mediastinal lymphadenopathy, consistent with metastatic disease. No evidence of abdominal or pelvic metastatic disease. 3.3 cm infrarenal abdominal aortic aneurysm. Consider follow-up ultrasound every 3 years if appropriate. This recommendation follows ACR consensus guidelines: White Paper of the ACR Incidental Findings Committee II on Vascular Findings. J Am Coll Radiol 2013; 10:789-794. Aortic Atherosclerosis (ICD10-I70.0). Electronically Signed   By: Marlaine Hind M.D.   On: 09/03/2020 20:43    Scheduled Meds: . amLODipine  5 mg Oral Daily  . dexamethasone (DECADRON) injection  10 mg Intravenous Q8H  . docusate sodium  100 mg Oral BID  . feeding supplement (NEPRO CARB STEADY)  237 mL Oral TID BM  . insulin aspart  0-20 Units Subcutaneous TID WC  .  insulin aspart  0-5 Units Subcutaneous QHS  . insulin aspart  10 Units Subcutaneous Once  . insulin aspart  3 Units Subcutaneous TID WC  . insulin glargine  10 Units Subcutaneous Daily  . levETIRAcetam  500 mg Oral BID  . [START ON 09/06/2020] multivitamin with minerals  1 tablet Oral Daily  . nicotine  14 mg Transdermal Daily  . polyethylene glycol  17 g Oral Daily  . revefenacin  175 mcg Nebulization Daily  . sodium chloride flush  3 mL Intravenous Q12H   Continuous Infusions: . sodium chloride       LOS: 2 days   Marylu Lund, MD [facility] Pager On Amion  If 7PM-7AM, please contact night-coverage 09/05/2020, 2:51 PM

## 2020-09-05 NOTE — Progress Notes (Signed)
I very much appreciate the help from the hospitalist-particularly Dr. Genelle Gather getting Bryce Powell admitted.  This is going to really speed up his work-up.  He actually had his scans all done.  The CT of the body showed the right upper lobe mass which is the primary.  It measures 4.9 x 3.4 cm.  He has mild mediastinal and hilar adenopathy.  There is a 9 mm pulmonary nodule also in the right lung.  There is no evidence of abdominal or pelvic metastatic disease.  He had an MRI of the brain.  He has 2 left cerebral masses.  Largest measures 3.2 cm.  The smaller in the left occipital lobe measures 1.4 cm.  He has a 6 mm midline shift.  He has a normal CEA.  I think the next step is probably going to get pulmonary to see about a bronchoscopy.  I would have to think that the can do transbronchial biopsies.  As always, he wants to go home.  I told him that it is incredibly important that he stay in the hospital so that we can get everybody to see him.  I told him that his wife wants him in the hospital to help with our evaluation.  His prealbumin is 26.  This is actually quite good.  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  I suppose small cell would also be a possibility but this just has the appearance of a bronchogenic carcinoma, that is squamous cell.  He needs radiation oncology to see him.  I cannot imagine that we are looking at anything that is surgically amenable for the brain, particularly since he has the large right lung mass.  On his physical exam, his temperature is 97.3.  Pulse 62.  Blood pressure 135/58.  His lungs are with some wheezes on the right side.  He has decent air movement bilaterally.  Cardiac exam regular rate and rhythm.  Abdomen is soft.  He has decent bowel sounds.  He has no palpable liver or spleen tip.  Extremities shows no clubbing, cyanosis or edema.  He moves all extremities well.  Neurological exam shows no focal neurological deficits.  He  still may have a little bit of disorientation.  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  We really need to get pulmonary to see him to see about a bronchoscopy.  We need radiation oncology to see him for radiation therapy to these brain mets.  I do not know if you would qualify for stereotactic radiosurgery.  The occipital lesion clearly is a candidate.  The temporal 1 might be too large for stereotactic radiosurgery.  I know that he is getting outstanding care from all staff of [facility].  I very much appreciate their help.  Bryce Haw, MD  Bryce Powell 3:17

## 2020-09-05 NOTE — Progress Notes (Signed)
Wife and daughter at bedside. They spoke with Dr. Chase Caller.

## 2020-09-05 NOTE — Progress Notes (Signed)
Initial Nutrition Assessment  DOCUMENTATION CODES:   Not applicable  INTERVENTION:   Nepro Shake po TID, each supplement provides 425 kcal and 19 grams protein  MVI po daily   Pt at high refeed risk; recommend monitor potassium, magnesium and phosphorus labs daily until stable  NUTRITION DIAGNOSIS:   Increased nutrient needs related to cancer and cancer related treatments as evidenced by estimated needs.  GOAL:   Patient will meet greater than or equal to 90% of their needs  MONITOR:   PO intake,Supplement acceptance,Labs,Weight trends,Skin,I & O's  REASON FOR ASSESSMENT:   Malnutrition Screening Tool    ASSESSMENT:   80 y/o male with h/o DM, PAF, NSTEMI, HTN and CABG x 3 who is admitted with new lung cancer and brain metastasis   RD working remotely.  Unable to speak with pt by phone r/t AMS. Per family report, pt with weight loss and poor oral intake pta. Family reports recent significant weight loss of ~80lbs. Per chart, pt appears weight stable since 2019 if the weights documented in chart are correct. Pt documented to be eating 100% of meals in hospital. RD will add supplements and MVI to help pt meet his estimated needs. Pt is likely at refeed risk. RD will obtain nutrition related history and exam at follow-up.   Medications reviewed and include: dexamethasone, colace, insulin, MVI, nicotine, miralax  Labs reviewed: wbc 10.9(H) cbgs- 340, 411 x 24 hrs AIC 8.3(H)- 4/22   NUTRITION - FOCUSED PHYSICAL EXAM: Unable to perform at this time   Diet Order:   Diet Order            Diet NPO time specified Except for: Sips with Meds  Diet effective ____           Diet heart healthy/carb modified Room service appropriate? Yes; Fluid consistency: Thin  Diet effective now                EDUCATION NEEDS:   Not appropriate for education at this time  Skin:  Skin Assessment: Reviewed RN Assessment  Last BM:  4/22  Height:   Ht Readings from Last 1  Encounters:  09/03/20 6' (1.829 m)    Weight:   Wt Readings from Last 1 Encounters:  09/03/20 95 kg    Ideal Body Weight:  80.9 kg  BMI:  Body mass index is 28.4 kg/m.  Estimated Nutritional Needs:   Kcal:  2300-2600kcal/day  Protein:  115-130g/day  Fluid:  2.0-2.3L/day  Koleen Distance MS, RD, LDN Please refer to [facility] for RD and/or RD on-call/weekend/after hours pager

## 2020-09-05 NOTE — Telephone Encounter (Signed)
PATIENT: Bryce Betty GENDER: male MRN: [id number] DOB: 1940-08-16 ADDRESS: [address] [place] [address]    Please schedule the following for Dr Kara Mead - patient is inpatient:  Diagnosis: lung mass Procedure: Video bronchocoopy, flexible bronchoscopy with Transbronchial biopsy andEBUS Envisia Classifer Transbronchial biopsy: NO Anesthesia: general Do you need Fluro? yes Size of Scope:large Pre-med nebulized lidocaine: no Priority: For Monday 09/06/20 at [facility] or Tued 09/07/20 at [facility]. Patient is in patient Location: [place] Does patient have OSA? np DM? np Or Latex allergy? no Medication Restriction: npo excepot meds. No anticoagulation Anticoagulate/Antiplatelet: none currently Pre-op Labs Ordered: already done Imaging request: none       MISCELLANEOUS KEY INSTRUCTIONS    Please coordinate Pre-op COVID Testing   Please let Dr Elsworth Soho know via reply phone message on Epic  Thank you     Key patient medical info     Allergy History:  Allergies  Allergen Reactions  . Atorvastatin     MUSCLE ACHES     No current facility-administered medications for this visit. No current outpatient medications on file.  Facility-Administered Medications Ordered in Other Visits:  .  0.9 %  sodium chloride infusion, 250 mL, Intravenous, PRN, Derrill Kay A, MD .  albuterol (VENTOLIN HFA) 108 (90 Base) MCG/ACT inhaler 2 puff, 2 puff, Inhalation, Q6H PRN, Chase Caller, Sabian Kuba, MD .  amLODipine (NORVASC) tablet 5 mg, 5 mg, Oral, Daily, Donne Hazel, MD, 5 mg at 09/05/20 1106 .  dexamethasone (DECADRON) injection 10 mg, 10 mg, Intravenous, Q8H, Derrill Kay A, MD, 10 mg at 09/05/20 0907 .  docusate sodium (COLACE) capsule 100 mg, 100 mg, Oral, BID, Donne Hazel, MD, 100 mg at 09/05/20 1106 .  insulin aspart (novoLOG) injection 0-9 Units, 0-9 Units, Subcutaneous, TID WC, Phillips Grout, MD, 7 Units at 09/05/20 [phone] .  levETIRAcetam (KEPPRA) tablet  500 mg, 500 mg, Oral, BID, Derrill Kay A, MD, 500 mg at 09/05/20 1107 .  nicotine (NICODERM CQ - dosed in mg/24 hours) patch 14 mg, 14 mg, Transdermal, Daily, Donne Hazel, MD, 14 mg at 09/05/20 1107 .  polyethylene glycol (MIRALAX / GLYCOLAX) packet 17 Powell, 17 Powell, Oral, Daily, Donne Hazel, MD, 17 Powell at 09/05/20 1106 .  revefenacin (YUPELRI) nebulizer solution 175 mcg, 175 mcg, Nebulization, Daily, Tabitha Tupper, MD .  sodium chloride flush (NS) 0.9 % injection 3 mL, 3 mL, Intravenous, Q12H, Derrill Kay A, MD, 3 mL at 09/05/20 1108 .  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    has a past surgical history that includes LEFT HEART CATH AND CORONARY ANGIOGRAPHY (N/A, 03/29/2017); Coronary artery bypass graft (N/A, 03/30/2017); and TEE without cardioversion (N/A, 03/30/2017).   SIGNATURE    Dr. Brand Males, M.D., F.C.C.P,  Pulmonary and Critical Care Medicine Staff Physician, [place] Director - Interstitial Lung Disease  Program  Pulmonary [place] [facility] [place], [place], [address]  Pager: [phone], If no answer or between  15:00h - 7:00h: call [phone] Telephone: [phone]  11:18 AM 09/05/2020

## 2020-09-06 ENCOUNTER — Inpatient Hospital Stay (HOSPITAL_COMMUNITY): Payer: Medicare Other | Admitting: Anesthesiology

## 2020-09-06 ENCOUNTER — Encounter (HOSPITAL_COMMUNITY)
Admission: AD | Disposition: A | Discharge status: Home / Self Care | Payer: Self-pay | Source: Ambulatory Visit | Attending: Internal Medicine

## 2020-09-06 ENCOUNTER — Ambulatory Visit
Admit: 2020-09-06 | Discharge: 2020-09-06 | Disposition: A | Discharge status: Home / Self Care | Payer: Medicare Other | Source: Ambulatory Visit | Attending: Radiation Oncology | Admitting: Radiation Oncology

## 2020-09-06 ENCOUNTER — Encounter (HOSPITAL_COMMUNITY): Payer: Self-pay | Admitting: Family Medicine

## 2020-09-06 DIAGNOSIS — C7931 Secondary malignant neoplasm of brain: Principal | ICD-10-CM

## 2020-09-06 DIAGNOSIS — E0800 Diabetes mellitus due to underlying condition with hyperosmolarity without nonketotic hyperglycemic-hyperosmolar coma (NKHHC): Secondary | ICD-10-CM | POA: Diagnosis not present

## 2020-09-06 DIAGNOSIS — Z794 Long term (current) use of insulin: Secondary | ICD-10-CM | POA: Diagnosis not present

## 2020-09-06 DIAGNOSIS — C3411 Malignant neoplasm of upper lobe, right bronchus or lung: Secondary | ICD-10-CM | POA: Insufficient documentation

## 2020-09-06 DIAGNOSIS — G936 Cerebral edema: Secondary | ICD-10-CM | POA: Diagnosis not present

## 2020-09-06 DIAGNOSIS — C3490 Malignant neoplasm of unspecified part of unspecified bronchus or lung: Principal | ICD-10-CM

## 2020-09-06 DIAGNOSIS — F17200 Nicotine dependence, unspecified, uncomplicated: Secondary | ICD-10-CM | POA: Insufficient documentation

## 2020-09-06 HISTORY — PX: BRONCHIAL BIOPSY: SHX5109

## 2020-09-06 HISTORY — PX: BRONCHIAL WASHINGS: SHX5105

## 2020-09-06 HISTORY — PX: ENDOBRONCHIAL ULTRASOUND: SHX5096

## 2020-09-06 HISTORY — PX: BRONCHIAL NEEDLE ASPIRATION BIOPSY: SHX5106

## 2020-09-06 HISTORY — PX: BRONCHIAL BRUSHINGS: SHX5108

## 2020-09-06 HISTORY — PX: VIDEO BRONCHOSCOPY: SHX5072

## 2020-09-06 LAB — CBC
HCT: 31.6 % — ABNORMAL LOW (ref 39.0–52.0)
Hemoglobin: 10.2 g/dL — ABNORMAL LOW (ref 13.0–17.0)
MCH: 28.1 pg (ref 26.0–34.0)
MCHC: 32.3 g/dL (ref 30.0–36.0)
MCV: 87.1 fL (ref 80.0–100.0)
Platelets: 327 10*3/uL (ref 150–400)
RBC: 3.63 MIL/uL — ABNORMAL LOW (ref 4.22–5.81)
RDW: 17.1 % — ABNORMAL HIGH (ref 11.5–15.5)
WBC: 13.2 10*3/uL — ABNORMAL HIGH (ref 4.0–10.5)
nRBC: 0 % (ref 0.0–0.2)

## 2020-09-06 LAB — COMPREHENSIVE METABOLIC PANEL
ALT: 31 U/L (ref 0–44)
AST: 21 U/L (ref 15–41)
Albumin: 2.9 g/dL — ABNORMAL LOW (ref 3.5–5.0)
Alkaline Phosphatase: 71 U/L (ref 38–126)
Anion gap: 9 (ref 5–15)
BUN: 33 mg/dL — ABNORMAL HIGH (ref 8–23)
CO2: 27 mmol/L (ref 22–32)
Calcium: 9.4 mg/dL (ref 8.9–10.3)
Chloride: 99 mmol/L (ref 98–111)
Creatinine, Ser: 0.9 mg/dL (ref 0.61–1.24)
GFR, Estimated: >60 mL/min (ref >60)
Glucose, Bld: 316 mg/dL — ABNORMAL HIGH (ref 70–99)
Potassium: 4.2 mmol/L (ref 3.5–5.1)
Sodium: 135 mmol/L (ref 135–145)
Total Bilirubin: 0.3 mg/dL (ref 0.3–1.2)
Total Protein: 6.8 g/dL (ref 6.5–8.1)

## 2020-09-06 LAB — GLUCOSE, CAPILLARY
Glucose-Capillary: 308 mg/dL — ABNORMAL HIGH (ref 70–99)
Glucose-Capillary: 242 mg/dL — ABNORMAL HIGH (ref 70–99)
Glucose-Capillary: 163 mg/dL — ABNORMAL HIGH (ref 70–99)

## 2020-09-06 LAB — RETICULOCYTES
Immature Retic Fract: 22.1 % — ABNORMAL HIGH (ref 2.3–15.9)
RBC.: 3.62 MIL/uL — ABNORMAL LOW (ref 4.22–5.81)
Retic Count, Absolute: 73.8 10*3/uL (ref 19.0–186.0)
Retic Ct Pct: 2 % (ref 0.4–3.1)

## 2020-09-06 LAB — FERRITIN: Ferritin: 365 ng/mL — ABNORMAL HIGH (ref 24–336)

## 2020-09-06 LAB — IRON AND TIBC
Iron: 108 ug/dL (ref 45–182)
Saturation Ratios: 37 % (ref 17.9–39.5)
TIBC: 288 ug/dL (ref 250–450)
UIBC: 180 ug/dL

## 2020-09-06 SURGERY — ENDOBRONCHIAL ULTRASOUND (EBUS)
Anesthesia: General

## 2020-09-06 MED ORDER — INSULIN GLARGINE 100 UNIT/ML SOLOSTAR PEN: 14.0000 [IU] | PEN_INJECTOR | Freq: Every day | SUBCUTANEOUS | 0 refills | Status: AC

## 2020-09-06 MED ORDER — LIDOCAINE 2% (20 MG/ML) 5 ML SYRINGE
INTRAMUSCULAR | Status: DC | PRN
  Administered 2020-09-06: 40 mg via INTRAVENOUS

## 2020-09-06 MED ORDER — INSULIN ASPART 100 UNIT/ML ~~LOC~~ SOLN
4.0000 [IU] | Freq: Once | SUBCUTANEOUS | Status: AC
  Administered 2020-09-06: 4 [IU] via SUBCUTANEOUS

## 2020-09-06 MED ORDER — INSULIN ASPART 100 UNIT/ML ~~LOC~~ SOLN
5.0000 [IU] | Freq: Three times a day (TID) | SUBCUTANEOUS | Status: DC
  Administered 2020-09-06: 5 [IU] via SUBCUTANEOUS

## 2020-09-06 MED ORDER — ROCURONIUM BROMIDE 10 MG/ML (PF) SYRINGE
PREFILLED_SYRINGE | INTRAVENOUS | Status: DC | PRN
  Administered 2020-09-06: 70 mg via INTRAVENOUS

## 2020-09-06 MED ORDER — ONDANSETRON HCL 4 MG/2ML IJ SOLN
INTRAMUSCULAR | Status: DC | PRN
  Administered 2020-09-06: 4 mg via INTRAVENOUS

## 2020-09-06 MED ORDER — INSULIN ASPART 100 UNIT/ML ~~LOC~~ SOLN
SUBCUTANEOUS | Status: AC
  Filled 2020-09-06: qty 1

## 2020-09-06 MED ORDER — AMOXICILLIN-POT CLAVULANATE 875-125 MG PO TABS: 1.0000 | ORAL_TABLET | Freq: Two times a day (BID) | ORAL | 0 refills | Status: AC

## 2020-09-06 MED ORDER — INSULIN STARTER KIT- PEN NEEDLES (ENGLISH)
1.0000 | Freq: Once | Status: AC
  Administered 2020-09-06: 1
  Filled 2020-09-06: qty 1

## 2020-09-06 MED ORDER — INSULIN GLARGINE 100 UNIT/ML ~~LOC~~ SOLN: 14.0000 [IU] | Freq: Every day | SUBCUTANEOUS | Status: DC

## 2020-09-06 MED ORDER — LACTATED RINGERS IV SOLN: INTRAVENOUS | Status: DC

## 2020-09-06 MED ORDER — AMOXICILLIN-POT CLAVULANATE 875-125 MG PO TABS
1.0000 | ORAL_TABLET | Freq: Two times a day (BID) | ORAL | Status: DC
  Administered 2020-09-06: 1 via ORAL
  Filled 2020-09-06: qty 1

## 2020-09-06 MED ORDER — FENTANYL CITRATE (PF) 100 MCG/2ML IJ SOLN
INTRAMUSCULAR | Status: AC
  Filled 2020-09-06: qty 2

## 2020-09-06 MED ORDER — FENTANYL CITRATE (PF) 100 MCG/2ML IJ SOLN
INTRAMUSCULAR | Status: DC | PRN
  Administered 2020-09-06 (×2): 50 ug via INTRAVENOUS

## 2020-09-06 MED ORDER — LIP MEDEX EX OINT
TOPICAL_OINTMENT | CUTANEOUS | Status: AC
  Administered 2020-09-06: 1
  Filled 2020-09-06: qty 7

## 2020-09-06 MED ORDER — SUGAMMADEX SODIUM 500 MG/5ML IV SOLN
INTRAVENOUS | Status: DC | PRN
  Administered 2020-09-06: 300 mg via INTRAVENOUS

## 2020-09-06 MED ORDER — DEXAMETHASONE 4 MG PO TABS: 4.0000 mg | ORAL_TABLET | Freq: Three times a day (TID) | ORAL | 0 refills | Status: AC

## 2020-09-06 MED ORDER — INSULIN PEN NEEDLE 31G X 5 MM MISC: 1.0000 | Freq: Four times a day (QID) | 0 refills | Status: DC

## 2020-09-06 MED ORDER — INSULIN LISPRO (1 UNIT DIAL) 100 UNIT/ML (KWIKPEN): 5.0000 [IU] | PEN_INJECTOR | Freq: Three times a day (TID) | SUBCUTANEOUS | 0 refills | Status: AC

## 2020-09-06 MED ORDER — LEVETIRACETAM 500 MG PO TABS: 500.0000 mg | ORAL_TABLET | Freq: Two times a day (BID) | ORAL | 0 refills | Status: AC

## 2020-09-06 MED ORDER — EPHEDRINE SULFATE-NACL 50-0.9 MG/10ML-% IV SOSY
PREFILLED_SYRINGE | INTRAVENOUS | Status: DC | PRN
  Administered 2020-09-06 (×2): 7.5 mg via INTRAVENOUS

## 2020-09-06 MED ORDER — PROPOFOL 10 MG/ML IV BOLUS
INTRAVENOUS | Status: DC | PRN
  Administered 2020-09-06: 100 mg via INTRAVENOUS

## 2020-09-06 NOTE — Telephone Encounter (Signed)
I scheduled already, thanks

## 2020-09-06 NOTE — Discharge Instructions (Signed)
Aspirin and Your Heart Aspirin is a medicine that prevents the platelets in your blood from sticking together. Platelets are the cells that your blood uses for clotting. Aspirin can be used to help reduce the risk of blood clots, heart attacks, and other heart-related problems. What are the risks? Daily use of aspirin can cause side effects. Some of these include:  Bleeding. Bleeding can be minor or serious. An example of minor bleeding is bleeding from a cut, and the bleeding does not stop. An example of more serious bleeding is stomach bleeding or, rarely, bleeding into the brain. Your risk of bleeding increases if you are also taking NSAIDs, such as ibuprofen.  Increased bruising.  Upset stomach.  An allergic reaction. People who have growths inside the nose (nasal polyps) have an increased risk of developing an aspirin allergy. How to use aspirin to care for your heart  Take aspirin only as told by your health care provider. Make sure that you understand how much to take and what form to take. The two forms of aspirin are: ? Non-enteric-coated.This type of aspirin does not have a coating and is absorbed quickly. This type of aspirin also comes in a chewable form. ? Enteric-coated. This type of aspirin has a coating that releases the medicine very slowly. Enteric-coated aspirin might cause less stomach upset than non-enteric-coated aspirin. This type of aspirin should not be chewed or crushed.  Work with your health care provider to find out whether it is safe and beneficial for you to take aspirin daily. Taking aspirin daily may be helpful if: ? You have had a heart attack or chest pain, or you are at risk for a heart attack. ? You have a condition in which certain heart vessels are blocked (coronary artery disease), and you have had a procedure to treat it. Examples are:  Open-heart surgery, such as coronary artery bypass surgery (CABG).  Coronary angioplasty,which is done to widen a  blood vessel of your heart.  Having a small mesh tube, or stent, placed in your coronary artery. ? You have had certain types of stroke or a mini-stroke known as a transient ischemic attack (TIA). ? You have a narrowing of the arteries that supply the limbs (peripheral artery disease, or PAD). ? You have long-term (chronic) heart rhythm problems, such as atrial fibrillation, and your health care provider thinks aspirin may help. ? You have valve disease or have had surgery on a valve. ? You are considered at increased risk of developing coronary artery disease or PAD.   Follow these instructions at home Medicines  Take over-the-counter and prescription medicines only as told by your health care provider.  If you are taking blood thinners: ? Talk with your health care provider before you take any medicines that contain aspirin or NSAIDs, such as ibuprofen. These medicines increase your risk for dangerous bleeding. ? Take your medicine exactly as told, at the same time every day. ? Avoid activities that could cause injury or bruising, and follow instructions about how to prevent falls. ? Wear a medical alert bracelet or carry a card that lists what medicines you take. General instructions  Do not drink alcohol if: ? Your health care provider tells you not to drink. ? You are pregnant, may be pregnant, or are planning to become pregnant.  If you drink alcohol: ? Limit how much you use to:  0-1 drink a day for women.  0-2 drinks a day for men. ? Be aware of how  much alcohol is in your drink. In the [place] one drink equals one 12 oz bottle of beer (355 mL), one 5 oz glass of wine (148 mL), or one 1 oz glass of hard liquor (44 mL).  Keep all follow-up visits as told by your health care provider. This is important. Where to find more information  The American Heart Association: www.heart.org Contact a health care provider if you have:  Unusual bleeding or bruising.  Stomach pain or  nausea.  Ringing in your ears.  An allergic reaction that causes hives, itchy skin, or swelling of the lips, tongue, or face. Get help right away if:  You notice that your bowel movements are bloody, or dark red or black in color.  You vomit or cough up blood.  You have blood in your urine.  You cough, breathe loudly (wheeze), or feel short of breath.  You have chest pain, especially if the pain spreads to your arms, back, neck, or jaw.  You have a headache with confusion. You have any symptoms of a stroke. "BE FAST" is an easy way to remember the main warning signs of a stroke:  B - Balance. Signs are dizziness, sudden trouble walking, or loss of balance.  E - Eyes. Signs are trouble seeing or a sudden change in vision.  F - Face. Signs are sudden weakness or numbness of the face, or the face or eyelid drooping on one side.  A - Arms. Signs are weakness or numbness in an arm. This happens suddenly and usually on one side of the body.  S - Speech. Signs are sudden trouble speaking, slurred speech, or trouble understanding what people say.  T - Time. Time to call emergency services. Write down what time symptoms started. You have other signs of a stroke, such as:  A sudden, severe headache with no known cause.  Nausea or vomiting.  Seizure. These symptoms may represent a serious problem that is an emergency. Do not wait to see if the symptoms will go away. Get medical help right away. Call your local emergency services (911 in the [place] Do not drive yourself to the hospital. Summary  Aspirin use can help reduce the risk of blood clots, heart attacks, and other heart-related problems.  Daily use of aspirin can cause side effects.  Take aspirin only as told by your health care provider. Make sure that you understand how much to take and what form to take.  Your health care provider will help you determine whether it is safe and beneficial for you to take aspirin  daily. This information is not intended to replace advice given to you by your health care provider. Make sure you discuss any questions you have with your health care provider. Document Revised: 02/03/2019 Document Reviewed: 02/03/2019 Elsevier Patient Education  2021 [place].   Cerebral Edema, Adult Cerebral edema is swelling (edema) in the brain. This condition is caused by a buildup of fluid in the brain tissue because of a brain injury or other health problem. The buildup of fluid increases pressure on the brain cells and blood vessels, which can lead to brain damage and even cell death. Cerebral edema is a medical emergency that is treated at the hospital. The long-term effects will vary depending on the amount of damage to the brain. What are the causes? Common causes of this condition include:  A brain injury from head trauma.  Lack of oxygen to the brain, which can happen for various reasons, such  as being at high altitudes.  Bleeding inside or around the brain.  A blocked supply of blood to part of the brain (stroke).  Infection from a virus, bacteria, or parasite.  Very high blood pressure (malignant hypertension).  Carbon monoxide poisoning. Carbon monoxide is a gas that has no color or odor and is released when burning certain fuels.  A brain tumor or other lesion in the brain.  Diabetic ketoacidosis (DKA), which is a severe complication of diabetes. What are the signs or symptoms? Symptoms of this condition depend on the cause. The timing and severity of the symptoms will also vary. Symptoms may include:  A headache with or without a stiff neck.  Confusion, tiredness (fatigue), or losing consciousness.  Nausea or vomiting.  Feeling dizzy.  Being unsteady or having difficulty with balance.  Vision changes, such as blurred vision.  Numbness or tingling in part of your body.  Feeling weak or being unable to move part of your body or face.  Trouble  speaking. In severe cases, symptoms may include:  A seizure.  Trouble breathing.  Unresponsiveness. How is this diagnosed? This condition is diagnosed based on:  Your symptoms, medical history, and history of any recent events such as trauma or traveling at high altitudes.  A physical exam, including a check of how well your nervous system is working (neurological exam).  Tests, which may include: ? Blood tests. ? Imaging tests, such as:  An MRI scan.  A CT scan.  An angiogram. This is a type of brain X-ray done after having dye injected into an artery. ? Intracranial pressure monitoring. This tracks the amount of pressure inside the skull. It may be done using:  A device that is placed inside the skull by a surgeon.  An extraventricular drain. This is a small, thin tube (catheter) that helps to remove extra fluid from the brain. How is this treated? Treatment for this condition aims to reduce swelling from too much fluid in the brain. The treatment approach depends on the cause. Possible treatments include:  Giving IV fluids that contain a sugar (mannitol) that draws fluid from the brain.  Using a ventilator machine for help with breathing to control carbon dioxide levels (controlled hyperventilation).  Giving medicine to: ? Increase urination (diuretic) to remove excess fluid. ? Reduce inflammation and swelling in the brain (steroid). ? Cause deep sleep (sedation).  Raising (elevating) your head when you are lying down. This decreases the pressure in the brain.  Doing surgery to lower the pressure in your brain. This may include: ? Placing an extraventricular drain into your brain to remove extra fluid. ? Removing a part of the skull bone for a short time (craniectomy) to relieve pressure inside the skull. Treatment will also include managing any health condition that caused the edema. Follow these instructions at home: What you need to do at home depends on how  severe your cerebral edema is. Follow the instructions that your health care provider gives you. Activity  Rest as told by your health care provider.  Return to your normal activities as told by your health care provider. Ask your health care provider what activities are safe for you. Lifestyle  Do not drink alcohol if: ? Your health care provider tells you not to drink. ? You are pregnant, may be pregnant, or are planning to become pregnant.  Do not use any products that contain nicotine or tobacco, such as cigarettes, e-cigarettes, and chewing tobacco. If you need help quitting,  ask your health care provider. General instructions  Learn as much as you can about your condition, and work closely with your team of health care providers.  Manage any other health conditions you may have.  Follow instructions from your health care provider about eating or drinking restrictions.  Take over-the-counter and prescription medicines only as told by your health care provider.  Keep all follow-up visits as told by your health care provider. This is important. This may include working with a physical therapist or other specialists.   Contact a health care provider if:  Your symptoms do not improve in the time frame that your medical team told you to expect.  Your symptoms are slowly getting worse. Get help right away if:  You have any sudden changes in your symptoms, such as: ? A severe headache, especially if it is new or different from before. ? A fever. ? Severe nausea and vomiting. ? Weakness in one area of the face, arms, or legs. ? Vision loss or increased trouble seeing. ? Confusion. ? Lethargy, or feeling very tired. ? Not being able to speak or walk. ? A seizure. These symptoms may represent a serious problem that is an emergency. Do not wait to see if the symptoms will go away. Get medical help right away. Call your local emergency services (911 in the [place] Do not drive  yourself to the hospital. Summary  Cerebral edema is swelling in the brain. It can be caused by various things, such as head trauma, infection, or tumors.  Cerebral edema is a medical emergency that is treated at the hospital.  The long-term effects of this condition will vary depending on the amount of damage to the brain.  Keep all follow-up visits, and work closely with your team of health care providers. This information is not intended to replace advice given to you by your health care provider. Make sure you discuss any questions you have with your health care provider. Document Revised: 01/22/2019 Document Reviewed: 01/22/2019 Elsevier Patient Education  2021 [place] Lung cancer is an abnormal growth of cancerous cells that forms a mass (malignant tumor) in a lung. There are several types of lung cancer. The types are based on the appearance of the tumor cells. The two most common types are:  Non-small cell lung cancer. This type of lung cancer is the most common type. Non-small cell lung cancers include squamous cell carcinoma, adenocarcinoma, and large cell carcinoma.  Small cell lung cancer. In this type of lung cancer, abnormal cells are smaller than those of non-small cell lung cancer. Small cell lung cancer gets worse (progresses) faster than non-small cell lung cancer. What are the causes? The most common cause of lung cancer is smoking tobacco. The second most common cause is exposure to a chemical called radon. What increases the risk? You are more likely to develop this condition if:  You smoke tobacco.  You have been exposed to: ? Secondhand tobacco smoke. ? Radon gas. ? Uranium. ? Asbestos. ? Arsenic in drinking water. ? Air pollution.  You have a family or personal history of lung cancer.  You have had lung radiation therapy in the past.  You are older than age 12. What are the signs or symptoms? In the early stages, you may not have  any symptoms. As the cancer progresses, symptoms may include:  A lasting cough, possibly with blood.  Fatigue.  Unexplained weight loss.  Shortness of breath.  Loud breathing (  wheezing).  Chest pain.  Loss of appetite. Symptoms of advanced lung cancer include:  Hoarseness.  Bone or joint pain.  Weakness.  Change in the structure of the fingernails (clubbing), so that the nail looks like an upside-down spoon.  Swelling of the face or arms.  Inability to move the face (paralysis).  Drooping eyelids. How is this diagnosed? This condition may be diagnosed based on:  Your symptoms and medical history.  A physical exam.  A chest X-ray.  A CT scan.  Blood tests.  Sputum tests.  Removal of a sample of lung tissue (lung biopsy) for testing. Your cancer will be assessed (staged) to determine how severe it is and how much it has spread (metastasized). How is this treated? Treatment depends on the type and stage of your cancer. Treatment may include one or more of the following:  Surgery to remove as much of the cancer as possible. Lymph nodes in the area may be removed and tested for cancer as well.  Medicines that kill cancer cells (chemotherapy).  High-energy rays that kill cancer cells (radiation therapy).  Targeted therapy. This targets specific parts of cancer cells and the area around them to block the growth and spread of the cancer. Targeted therapy can help limit the damage to healthy cells. Follow these instructions at home: Eating and drinking  Some of your treatments might affect your appetite. If you are having problems eating, or if you do not have an appetite, meet with a dietitian.  If you have side effects that affect your appetite, it may help to: ? Eat smaller meals and snacks often. ? Drink high-nutrition and high-calorie shakes or supplements. ? Eat bland and soft foods that are easy to eat. ? Avoid eating foods that are hot, spicy, or hard  to swallow. General instructions  Do not use any products that contain nicotine or tobacco, such as cigarettes and e-cigarettes. If you need help quitting, ask your health care provider.  Do not drink alcohol.  If you are admitted to the hospital, make sure your cancer specialist (oncologist) is aware. Your cancer may affect your treatment for other conditions.  Take over-the-counter and prescription medicines only as told by your health care provider.  Consider joining a support group for people who have been diagnosed with lung cancer.  Work with your health care provider to manage any side effects of treatment.  Keep all follow-up visits as told by your health care provider. This is important.   Where to find more information  American Cancer Society: https://www.cancer.[facility]): https://www.cancer.gov Contact a health care provider if you:  Lose weight without trying.  Have a persistent cough and wheezing.  Feel short of breath.  Get tired easily.  Have bone or joint pain.  Have difficulty swallowing.  Notice that your voice is changing or getting hoarse.  Have pain that does not get better with medicine. Get help right away if you:  Cough up blood.  Have new breathing problems.  Have chest pain.  Have a fever.  Have swelling in an ankle, leg, or arm, or the face or neck.  Have paralysis in your face.  Are very confused.  Have a drooping eyelid. Summary  Lung cancer is an abnormal growth of cancerous cells that forms a mass (malignant tumor) in a lung.  There are several types of lung cancer. The types are based on the appearance of the tumor cells. The two most common types are non-small  cell and small cell.  The most common cause of lung cancer is smoking tobacco.  Early symptoms include a lasting cough, possibly with blood, and fatigue, unexplained weight loss, and shortness of breath.  After diagnosis, treatment  depends on the type and stage of your cancer. This information is not intended to replace advice given to you by your health care provider. Make sure you discuss any questions you have with your health care provider. Document Revised: 01/01/2020 Document Reviewed: 01/01/2020 Elsevier Patient Education  2021 [place] [employer] Management Taking your medicines correctly is an important part of managing or preventing medical problems. Make sure you know what disease or condition your medicine is treating, and how and when to take it. If you do not take your medicine correctly, it may not work well and may cause unpleasant side effects, including serious health problems. What should I do when I am taking medicines?  Read all the labels and inserts that come with your medicines. Review the information often.  Talk with your pharmacist if you get a refill and notice a change in the size, color, or shape of your medicines.  Know the potential side effects for each medicine that you take.  Try to get all your medicines from the same pharmacy. The pharmacist will have all your information and will understand how your medicines will affect each other (interact).  Tell your health care provider about all your medicines, including over-the-counter medicines, vitamins, and herbal or dietary supplements. He or she will make sure that nothing will interact with any of your prescribed medicines.   How can I take my medicines safely?  Take medicines only as told by your health care provider. ? Do not take more of your medicine than instructed. ? Do not take anyone else's medicines. ? Do not share your medicines with others. ? Do not stop taking your medicines unless your health care provider tells you to do so. ? You may need to avoid alcohol or certain foods or liquids when taking certain medicines. Follow your health care provider's instructions.  Do not split, mash, or chew your  medicines unless your health care provider tells you to do so. Tell your health care provider if you have trouble swallowing your medicines.  For liquid medicine, use the dosing container that was provided. How should I organize my medicines? Know your medicines  Know what each of your medicines looks like. This includes size, color, and shape. Tell your health care provider if you are having trouble recognizing all the medicines that you are taking.  If you cannot tell your medicines apart because they look similar, keep them in original bottles.  If you cannot read the labels on the bottles, tell your pharmacist to put your medicines in containers with large print.  Review your medicines and your schedule with family members, a friend, or a caregiver. Use a pill organizer  Use a tool to organize your medicine schedule. Tools include a weekly pillbox, a written chart, a notebook, or a calendar.  Your tool should help you remember the following things about each medicine: ? The name of the medicine. ? The amount (dose) to take. ? The schedule. This is the day and time the medicine should be taken. ? The appearance. This includes color, shape, size, and stamp. ? How to take your medicines. This includes instructions to take them with food, without food, with fluids, or with other medicines.  Create reminders for  taking your medicines. Use sticky notes, or alarms on your watch, mobile device, or phone calendar.  You may choose to use a more advanced management system. These systems have storage, alarms, and visual and audio prompts.  Some medicines can be taken on an "as-needed" basis. These include medicines for nausea or pain. If you take an as-needed medicine, write down the name and dose, as well as the date and time that you took it.   How should I plan for travel?  Take your pillbox, medicines, and organization system with you when traveling.  Have your medicines refilled before  you travel. This will ensure that you do not run out of your medicines while you are away from home.  Always carry an updated list of your medicines with you. If there is an emergency, a first responder can quickly see what medicines you are taking.  Do not pack your medicines in checked luggage in case your luggage is lost or delayed.  If any of your medicines is considered a controlled substance, make sure you bring a letter from your health care provider with you. How should I store and discard my medicines? For safe storage:  Store medicines in a cool, dry area away from light, or as directed by your health care provider. Do not store medicines in the bathroom. Heat and humidity will affect them.  Do not store your medicines with other chemicals, or with medicines for pets or other household members.  Keep medicines away from children and pets. Do not leave them on counters or bedside tables. Store them in high cabinets or on high shelves. For safe disposal:  Check expiration dates regularly. Do not take expired medicines. Discard medicines that are older than the expiration date.  Learn a safe way to dispose of your medicines. You may: ? Use a local government, hospital, or pharmacy medicine-take-back program. ? Mix the medicines with inedible substances, put them in a sealed bag or empty container, and throw them in the trash. What should I remember?  Tell your health care provider if you: ? Experience side effects. ? Have new symptoms. ? Have other concerns about taking your medicines.  Review your medicines regularly with your health care provider. Other medicines, diet, medical conditions, weight changes, and daily habits can all affect how medicines work. Ask if you need to continue taking each medicine, and discuss how well each one is working.  Refill your medicines early to avoid running out of them.  In case of an accidental overdose, call your local [place] at [phone] or visit your local emergency department immediately. This is important. Summary  Taking your medicines correctly is an important part of managing or preventing medical problems.  You need to make sure that you understand what you are taking a medicine for, as well as how and when you need to take it.  Know your medicines and use a pill organizer to help you take your medicines correctly.  In case of an accidental overdose, call your local [place] at [phone] or visit your local emergency department immediately. This is important. This information is not intended to replace advice given to you by your health care provider. Make sure you discuss any questions you have with your health care provider. Document Revised: 04/26/2017 Document Reviewed: 04/26/2017 Elsevier Patient Education  2021 [place] Keep this form as a record of your medicines. Always keep this form with you.  Name:  Address:  Phone number:  Birth date:  Allergic to: ? Description of allergic reaction: How to use this form  If you need help to fill out this form, ask your health care provider, nurse, or pharmacist.  Take this form to all: ? Health care provider visits. ? Medical tests, such as lab, X-ray, MRI, or CT scan. ? Hospital and clinic visits, including ER, inpatient admission, surgeries or procedures, and outpatient visits.  Update this form when changes are made to your medicines. Draw a line through any medicine that was stopped, and write down the date when it was stopped. Also write down: ? The name of the health care provider(s) who prescribed each medicine for you. ? The reason you take each medicine.  When you are discharged from the hospital, you will get an updated form. Take the new form with you the next time you visit your health care provider. General instructions for medicine use  Take over-the-counter and  prescription medicines only as told by your health care provider.  Know what side effects to expect, if any. Contact your health care provider if you have side effects, new symptoms, or other concerns.  Review your medicines regularly with your health care provider. Ask if you need to continue to take each medicine, and discuss how well each medicine is working.  Read all the labels and inserts that come with your medicines.  Get all your medicines at one pharmacy. The pharmacist will have all your information and will know how your medicines work together (interact). Talk with your pharmacist if: ? Your medicine seems to be a different size, color, or shape than what you usually take. ? You have trouble telling your medicines apart. List of medicines List all prescription and over-the-counter medicines that you take. Include information about the medicine, including the name of the medicine and how much you need to take (dose). It is also helpful to include directions for how and when to take the medicine (dosage), such as instructions to take the medicine at a certain time or with or without food. Include medicines that you take only when you need them. If you need more space, make a copy of this blank form. 1. Name of medicine/dose: ____________________________/_________ ? Date prescribed: _________ Date stopped:_________ ? Directions for taking this medicine: ________________________________ ? Reason for taking:________________________________ ? Health care provider name:________________________________ ? Comments: ________________________________ 2. Name of medicine/dose: ____________________________/_________ ? Date prescribed: _________ Date stopped:_________ ? Directions for taking this medicine:________________________________ ? Reason for taking:________________________________ ? Health care provider  name:________________________________ ? Comments:________________________________ 3. Name of medicine/dose: ____________________________/_________ ? Date prescribed:_________Date stopped:_________ ? Directions for taking this medicine:________________________________ ? Reason for taking:________________________________ ? Health care provider name:________________________________ ? Comments:________________________________ 4. Name of medicine/dose: ____________________________/_________ ? Date prescribed:_________ Date stopped:_________ ? Directions for taking this medicine: ________________________________ ? Reason for taking:________________________________ ? Health care provider name: ________________________________ ? Comments:________________________________ 5. Name of medicine/dose: ____________________________/_________ ? Date prescribed:_________ Date stopped:_________ ? Directions for taking this medicine:________________________________ ? Reason for taking:________________________________ ? Health care provider name:________________________________ ? Comments:________________________________ 6. Name of medicine/dose: ____________________________/_________ ? Date prescribed:_________Date stopped:_________ ? Directions for taking this medicine:________________________________ ? Reason for taking:________________________________ ? Health care provider name:________________________________ ? Comments:________________________________ This information is not intended to replace advice given to you by your health care provider. Make sure you discuss any questions you have with your health care provider. Document Revised: 06/16/2019 Document Reviewed: 04/26/2017 Elsevier Patient Education  2021 [employer].   Medicine Refill at the Emergency Department We have refilled  your medicine today. However, it is best for you to get refills through your primary health care provider's  office. In the future, please plan ahead so you do not need to get refills from the emergency department. If we refilled a medicine that you take daily for a chronic problem (maintenance medicine), you may have received only enough to get you by until you are able to see your regular health care provider. This information is not intended to replace advice given to you by your health care provider. Make sure you discuss any questions you have with your health care provider. Document Revised: 05/14/2017 Document Reviewed: 05/14/2017 Elsevier Patient Education  2021 [employer].

## 2020-09-06 NOTE — Consult Note (Signed)
Radiation Oncology         [phone]) [phone] ________________________________  Name: Bryce Powell        MRN: [id number]  Date of Service: 09/06/20 DOB: [birth year]  [id number], Remo Lipps    REFERRING PHYSICIAN: Dr. Earlie Counts  DIAGNOSIS: The primary encounter diagnosis was Lung cancer metastatic to brain [facility]). 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   HISTORY OF PRESENT ILLNESS: Bryce Powell is a 80 y.o. male seen at the request of Dr. Wyline Copas for newly noted disease in the brain and likely stage IV lung cancer. The patient has had steady decline over the last year including up to an 80 pound weight loss and progressive shortness of breath, and weakness in his legs and an episode of incontinence. He was seen by PCP and some type of imaging was done that sent him to the ED on 08/26/20 at [facility]. CT of the head showed a 3.2 cm mass in the left temporal lobe.  He was offered surgery by neurosurgery in the ED, and decided to forego this and leave the hospital.  He did not get admitted.  He presented however on 09/03/2020 to [facility] ED. His symptoms were evaluated and he was offered CT chest abdomen pelvis which revealed a mass in the central and anterior right upper lobe measuring up to 5 cm with adjacent postobstructive atelectasis/pneumonitis as well as right hilar and mediastinal adenopathy, no additional evidence of metastases were identified in the abdomen or pelvis.  An MRI with and without contrast on 09/04/2020 showed upwards of of a 3.2 cm lesion in the left temporal lobe with peritumoral cyst posteriorly measuring 14 mm.  Mass-effect with a 6 mm midline shift and confluent vasogenic edema was identified.  He was started on Keppra and is currently on dexamethasone injection.  His case was discussed in brain oncology conference this morning, and further  work-up is recommended.  He is scheduled to undergo bronchoscopy this afternoon with Dr. Elsworth Soho.  We anticipate options of stereotactic radiosurgery [facility]) versus whole brain pending histology.  He is contacted along with his family today to discuss early company for [facility] about his treatment options.      PREVIOUS RADIATION THERAPY: No   PAST MEDICAL HISTORY:  Past Medical History:  Diagnosis Date  . Current smoker   . Diabetes ([place])   . HTN (hypertension)   . PAF (paroxysmal atrial fibrillation) ([place])    post CABG  . 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 Tobacco abuse        PAST SURGICAL HISTORY: Past Surgical History:  Procedure Laterality Date  . CORONARY ARTERY BYPASS GRAFT N/A 03/30/2017   Procedure: CORONARY ARTERY BYPASS GRAFTING (CABG) x Three, using left internal mammary artery and right leg greater saphenous vein harvested endoscopically;  Surgeon: Rexene Alberts, MD;  Location: [place];  Service: Open Heart Surgery;  Laterality: N/A;  . LEFT HEART CATH AND CORONARY ANGIOGRAPHY N/A 03/29/2017   Procedure: LEFT HEART CATH AND CORONARY ANGIOGRAPHY;  Surgeon: Lorretta Harp, MD;  Location: [place] [facility];  Service: Cardiovascular;  Laterality: N/A;  . TEE WITHOUT CARDIOVERSION N/A 03/30/2017   Procedure: TRANSESOPHAGEAL ECHOCARDIOGRAM (TEE);  Surgeon: Rexene Alberts, MD;  Location: [place];  Service: Open Heart Surgery;  Laterality: N/A;     FAMILY HISTORY:  Family History  Problem Relation Age of Onset  . Diabetes Mother   . Heart attack Mother   . Heart attack Father      SOCIAL HISTORY:  reports that he has been smoking cigars. He has a 120.00 pack-year smoking history. He has never used smokeless tobacco. He reports that he does not drink alcohol and does not use drugs. The patient is married and lives in [place]. His daughter Lynelle Smoke is involved in his care as well.     ALLERGIES: Atorvastatin   MEDICATIONS:  Current Facility-Administered Medications  Medication Dose Route Frequency Provider Last Rate Last Admin  . 0.9 %  sodium chloride infusion  250 mL Intravenous PRN Derrill Kay A, MD      . albuterol (VENTOLIN HFA) 108 (90 Base) MCG/ACT inhaler 2 puff  2 puff Inhalation Q6H PRN Brand Males, MD      . amLODipine (NORVASC) tablet 5 mg  5 mg Oral Daily Donne Hazel, MD   5 mg at 09/05/20 1106  . dexamethasone (DECADRON) injection 10 mg  10 mg Intravenous Q8H Derrill Kay A, MD   10 mg at 09/06/20 0349  . docusate sodium (COLACE) capsule 100 mg  100 mg Oral BID Donne Hazel, MD   100 mg at 09/05/20 2113  . feeding supplement (NEPRO CARB STEADY) liquid 237 mL  237 mL Oral TID BM Donne Hazel, MD   237 mL at 09/05/20 2112  . insulin aspart (novoLOG) injection 0-20 Units  0-20 Units Subcutaneous TID WC Donne Hazel, MD   11 Units at 09/05/20 1813  . insulin aspart (novoLOG) injection 0-5 Units  0-5 Units Subcutaneous QHS Donne Hazel, MD   4 Units at 09/05/20 2112  . insulin aspart (novoLOG) injection 3 Units  3 Units Subcutaneous TID WC Donne Hazel, MD   3 Units at 09/05/20 1814  . insulin glargine (LANTUS) injection 10 Units  10 Units Subcutaneous Daily Donne Hazel, MD   10 Units at 09/05/20 1823  . levETIRAcetam (KEPPRA) tablet 500 mg  500 mg Oral BID Derrill Kay A, MD   500 mg at 09/05/20 2113  . multivitamin with minerals tablet 1 tablet  1 tablet Oral Daily Donne Hazel, MD      . nicotine (NICODERM CQ - dosed in mg/24 hours) patch 14 mg  14 mg Transdermal Daily Donne Hazel, MD   14 mg at 09/05/20 1107  . polyethylene glycol (MIRALAX / GLYCOLAX) packet 17 g  17 g Oral Daily Donne Hazel, MD   17 g at 09/05/20 1106  . revefenacin (YUPELRI) nebulizer solution 175 mcg  175 mcg Nebulization Daily Ramaswamy, Murali, MD      . sodium chloride flush (NS) 0.9 % injection 3 mL  3 mL Intravenous Q12H Derrill Kay A,  MD   3 mL at 09/05/20 2113  . sodium chloride flush (NS) 0.9 % injection 3 mL  3 mL Intravenous PRN Phillips Grout, MD   3 mL at 09/03/20 2134     REVIEW OF SYSTEMS: On review of systems, the patient reports that he is doing well. He has had shortness of breath and dry cough but not hemoptysis. He has not needed oxygen. He has had a retinal detachment that was recently repaired. He's aware and not confused.      PHYSICAL EXAM:  Wt Readings from Last 3  Encounters:  09/06/20 197 lb 8.5 oz (89.6 kg)  08/19/20 201 lb (91.2 kg)  08/03/20 201 lb (91.2 kg)   Temp Readings from Last 3 Encounters:  09/06/20 97.6 F (36.4 C) (Oral)  09/03/20 98.1 F (36.7 C) (Oral)  07/23/20 98.3 F (36.8 C) (Oral)   BP Readings from Last 3 Encounters:  09/06/20 (!) 166/56  09/03/20 (!) 126/44  08/19/20 (!) 126/58   Pulse Readings from Last 3 Encounters:  09/06/20 (!) 54  09/03/20 88  08/19/20 82   Pain Assessment Pain Score: 0-No pain/10  Unable to assesss.   ECOG = 2  0 - Asymptomatic (Fully active, able to carry on all predisease activities without restriction)  1 - Symptomatic but completely ambulatory (Restricted in physically strenuous activity but ambulatory and able to carry out work of a light or sedentary nature. For example, light housework, office work)  2 - Symptomatic, <50% in bed during the day (Ambulatory and capable of all self care but unable to carry out any work activities. Up and about more than 50% of waking hours)  3 - Symptomatic, >50% in bed, but not bedbound (Capable of only limited self-care, confined to bed or chair 50% or more of waking hours)  4 - Bedbound (Completely disabled. Cannot carry on any self-care. Totally confined to bed or chair)  5 - Death   Eustace Pen MM, Creech RH, Tormey DC, et al. [phone]). "Toxicity and response criteria of the [facility]". [place] Oncol. 5 (6): 649-55    LABORATORY DATA:  Lab Results  Component  Value Date   WBC 13.2 (H) 09/06/2020   HGB 10.2 (L) 09/06/2020   HCT 31.6 (L) 09/06/2020   MCV 87.1 09/06/2020   PLT 327 09/06/2020   Lab Results  Component Value Date   NA 135 09/06/2020   K 4.2 09/06/2020   CL 99 09/06/2020   CO2 27 09/06/2020   Lab Results  Component Value Date   ALT 31 09/06/2020   AST 21 09/06/2020   ALKPHOS 71 09/06/2020   BILITOT 0.3 09/06/2020      RADIOGRAPHY: MR BRAIN W WO CONTRAST  Result Date: 09/04/2020 CLINICAL DATA:  CNS neoplasm staging EXAM: MRI HEAD WITHOUT AND WITH CONTRAST TECHNIQUE: Multiplanar, multiecho pulse sequences of the brain and surrounding structures were obtained without and with intravenous contrast. CONTRAST:  58mL GADAVIST GADOBUTROL 1 MMOL/ML IV SOLN COMPARISON:  None. FINDINGS: Brain: 2 similar heterogeneously enhancing left cerebral masses with confluent vasogenic edema spreading throughout the left temporal lobe, parietal lobe, and deep white matter tracks. There is local mass effect with 6 mm midline shift. Largest lesion measures 3.2 cm in the inferior and posterior left temporal lobe with peritumoral cyst posteriorly. The smaller is in the left occipital lobe and measures 14 mm. Small remote bilateral cerebellar infarct. Chronic small vessel ischemia in the cerebral white matter. Vascular: Normal flow voids and vascular enhancements Skull and upper cervical spine: Normal marrow signal Sinuses/Orbits: Bilateral cataract resection. IMPRESSION: Two left cerebral masses consistent with metastatic disease, the larger measuring 3.2 cm in the posterior temporal lobe. Extensive regional vasogenic edema with 6 mm of midline shift. Electronically Signed   By: Monte Fantasia M.D.   On: 09/04/2020 11:40   CT CHEST ABDOMEN PELVIS W CONTRAST  Result Date: 09/03/2020 CLINICAL DATA:  Brain metastases of unknown primary. EXAM: CT CHEST, ABDOMEN, AND PELVIS WITH CONTRAST TECHNIQUE: Multidetector CT imaging of the chest, abdomen and pelvis was  performed following the  standard protocol during bolus administration of intravenous contrast. CONTRAST:  144mL OMNIPAQUE IOHEXOL 300 MG/ML  SOLN COMPARISON:  None. FINDINGS: CT CHEST FINDINGS Cardiovascular: No acute findings.  Prior CABG noted. Mediastinum/Lymph Nodes: Mild mediastinal lymphadenopathy is seen in the right paratracheal region, with largest lymph node measuring 1.5 cm. Mild right hilar lymphadenopathy is also seen with largest lymph node measuring 1.7 cm. Lungs/Pleura: A masslike opacity is seen in the central and anterior right upper lobe which measures approximately 4.9 by 3.4 cm on image 21/2. Postobstructive atelectasis or pneumonitis is also seen in the anterior right upper lobe. A 9 mm pulmonary nodule is also seen in the lateral and inferior right upper lobe abutting the minor fissure on image 53/4. No evidence pleural effusion. Musculoskeletal:  No suspicious bone lesions identified. CT ABDOMEN AND PELVIS FINDINGS Hepatobiliary: No masses identified. Gallbladder is unremarkable. No evidence of biliary ductal dilatation. Pancreas:  No mass or inflammatory changes. Spleen:  Within normal limits in size and appearance. Adrenals/Urinary tract: Normal adrenal glands. A few small renal cysts are noted, however there is no evidence of renal masses or hydronephrosis. Masses or hydronephrosis. Stomach/Bowel: No evidence of obstruction, inflammatory process, or abnormal fluid collections. Normal appendix visualized. Diverticulosis is seen mainly involving the sigmoid colon, however there is no evidence of diverticulitis. Vascular/Lymphatic: No pathologically enlarged lymph nodes identified. Aortic atherosclerotic calcification noted. 3.3 cm infrarenal abdominal aortic aneurysm is seen. Reproductive:  No mass or other significant abnormality identified. Other:  None. Musculoskeletal:  No suspicious bone lesions identified. IMPRESSION: Mass in the central and anterior right upper lobe measuring  approximately 5 cm, with adjacent postobstructive atelectasis or pneumonitis. Differential diagnosis includes primary bronchogenic carcinoma and metastatic disease. Separate 9 mm pulmonary nodule in lateral right upper lobe adjacent to minor fissure, suspicious for synchronous primary carcinoma or metastatic disease. Mild right hilar and mediastinal lymphadenopathy, consistent with metastatic disease. No evidence of abdominal or pelvic metastatic disease. 3.3 cm infrarenal abdominal aortic aneurysm. Consider follow-up ultrasound every 3 years if appropriate. This recommendation follows ACR consensus guidelines: White Paper of the ACR Incidental Findings Committee II on Vascular Findings. J Am Coll Radiol 2013; 10:789-794. Aortic Atherosclerosis (ICD10-I70.0). Electronically Signed   By: Marlaine Hind M.D.   On: 09/03/2020 20:43   US ARTERIAL ABI (SCREENING LOWER EXTREMITY)  Result Date: 08/26/2020 CLINICAL DATA:  Pain in both feet and history of diabetes, hypertension and smoking. EXAM: NONINVASIVE PHYSIOLOGIC VASCULAR STUDY OF BILATERAL LOWER EXTREMITIES TECHNIQUE: Evaluation of both lower extremities were performed at rest, including calculation of ankle-brachial indices with single level Doppler, pressure and pulse volume recording. COMPARISON:  None. FINDINGS: Right ABI:  0.73 Left ABI:  0.64 Right Lower Extremity: Monophasic posterior tibial and dorsalis pedis waveforms distally. Left Lower Extremity: Monophasic posterior tibial and dorsalis pedis waveforms distally. 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 Consider further evaluation with CT angiography of the abdominal aorta with bilateral iliofemoral runoff for direct imaging of the arterial vasculature. Electronically Signed   By: Aletta Edouard M.D.   On: 08/26/2020 12:03       IMPRESSION/PLAN: 1. 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 Dr. Ceasar Lund aware of the patient's case which was discussed in multidisciplinary brain oncology conference. The plan is to continue his work up and to determine his tumor type. At this juncture, if he desires treatment, he would be a candidate for preoperative  SRS, SRS alone, or hospice based care. I discussed each of the modalities of treatment as well as whole brain styler treatment if that was indicated. He does not wish to have brain surgery, but would consider radiotherapy. He is aware of the need for a 3T MRI scan as well if this is NSCLC to proceed with SRS style therapy, and that if this were small cell, then whole brain radiation would be the standard of care. We discussed  the risks, benefits, short, and long term effects of radiotherapy. We will follow along with him and see him in another week or so pending pathology.   In a visit lasting 60 minutes, greater than 50% of the time was spent by phone discussing the patient's condition, in preparation for the discussion, and coordinating the patient's care.     Carola Rhine, [facility]   **Disclaimer: This note was dictated with voice recognition software. Similar sounding words can inadvertently be transcribed and this note may contain transcription errors which may not have been corrected upon publication of note.**

## 2020-09-06 NOTE — Anesthesia Preprocedure Evaluation (Signed)
Anesthesia Evaluation  Patient identified by MRN, date of birth, ID band Patient awake and Patient confused    Reviewed: Allergy & Precautions, NPO status , Patient's Chart, lab work & pertinent test results  Airway Mallampati: II  TM Distance: >3 FB     Dental  (+) Edentulous Upper, Edentulous Lower   Pulmonary Current Smoker and Patient abstained from smoking.,    + rhonchi  + decreased breath sounds      Cardiovascular hypertension,  Rhythm:Regular Rate:Normal     Neuro/Psych    GI/Hepatic   Endo/Other  diabetes  Renal/GU      Musculoskeletal   Abdominal   Peds  Hematology   Anesthesia Other Findings   Reproductive/Obstetrics                             Anesthesia Physical Anesthesia Plan  ASA: III  Anesthesia Plan: General   Post-op Pain Management:    Induction: Intravenous  PONV Risk Score and Plan: Ondansetron and Dexamethasone  Airway Management Planned: Oral ETT  Additional Equipment:   Intra-op Plan:   Post-operative Plan: Extubation in OR  Informed Consent:   Plan Discussed with: CRNA and Anesthesiologist  Anesthesia Plan Comments:         Anesthesia Quick Evaluation

## 2020-09-06 NOTE — Care Management Important Message (Signed)
Important Message  Patient Details IM Letter given to the Patient. Name: Bryce Powell MRN: [id number] Date of Birth: [birth year]   Medicare Important Message Given:  Yes     [name] 09/06/2020, 10:53 AM

## 2020-09-06 NOTE — Discharge Summary (Signed)
Physician Discharge Summary  Bryce Powell WEX:[id number] DOB: 1940-08-10 DOA: 09/03/2020  PCP: Candi Leash, PA-C  Admit date: 09/03/2020 Discharge date: 09/06/2020  Admitted From: Home Disposition:  Home  Recommendations for Outpatient Follow-up:  1. Follow up with PCP in 2-3 weeks 2. Follow up with Oncology as scheduled 3. Follow up with Radiation Oncology as scheduled  Discharge Condition:Stable CODE STATUS:Full Diet recommendation: Diabetic   Brief/Interim Summary: 80 yo male smoker, wt loss 80lbs in a year, ams on/off sent as referral to oncology office. ct brain recently shows couple tumors, ct chest lung mass right lung . Pt presented for further workup and evaluation after found to have new brain mets  Discharge Diagnoses:  Principal Problem:   Brain edema ([place]) Active Problems:   HTN (hypertension)   S/P CABG x 3   Diabetes ([place])   Lung cancer metastatic to brain ([place])  Multiple brain mets with edema -Pt was continued on decadron scheduled q8h with keppra -Consulted Rad Onc, who will follow up with patient as outpatient -Pt to continue on decadron 4mg  TID with keppra on d/c -Currently neurologically intact  R Lung mass -Reviewed on chest imaging, likley new diagnosis of metastatic lung cancer -Appreciate input by Dr. Marin Olp -Have consulted Pulmonary, appreciate assistance. Pt is now s/p bronch with biopsy -Discussed with Pulmonary. Ample mucoid secretions noted, thus recommendation for 14 days of augmentin following bronchoscopy -Pt to follow up with Oncology as outpatient  HTN (hypertension)-continued norvasc per home meds  S/P CABG x 3-Aspirin was initially held, likely secondary to brain mets  Diabetes ([facility])-very poorly controlled, likely made worse with concurrent steroids per above.  -Per diabetic coordinator, will continue pt on lantus 14 units with 5 units meal coverage  Tobacco abuse -cessation done at bedside -nicotine patch  continued during hospital stay   Discharge Instructions   Allergies as of 09/06/2020      Reactions   Atorvastatin    MUSCLE ACHES       Medication List    STOP taking these medications   glipiZIDE 10 MG 24 hr tablet Commonly known as: GLUCOTROL XL   lidocaine 5 % Commonly known as: Lidoderm   metFORMIN 1000 MG tablet Commonly known as: GLUCOPHAGE   predniSONE 5 MG (21) Tbpk tablet Commonly known as: STERAPRED UNI-PAK 21 TAB   traMADol 50 MG tablet Commonly known as: ULTRAM     TAKE these medications   acetaminophen 500 MG tablet Commonly known as: TYLENOL Take 2 tablets (1,000 mg total) by mouth every 6 (six) hours. What changed:   when to take this  reasons to take this   allopurinol 300 MG tablet Commonly known as: ZYLOPRIM Take 1 tablet (300 mg total) by mouth daily.   amLODipine 5 MG tablet Commonly known as: NORVASC Take 5 mg by mouth daily.   amoxicillin-clavulanate 875-125 MG tablet Commonly known as: AUGMENTIN Take 1 tablet by mouth every 12 (twelve) hours for 14 days.   dexamethasone 4 MG tablet Commonly known as: DECADRON Take 1 tablet (4 mg total) by mouth 3 (three) times daily.   insulin glargine 100 UNIT/ML Solostar Pen Commonly known as: LANTUS Inject 14 Units into the skin daily.   insulin lispro 100 UNIT/ML KwikPen Commonly known as: HumaLOG KwikPen Inject 5 Units into the skin 3 (three) times daily.   Insulin Pen Needle 31G X 5 MM Misc 1 Device by Does not apply route QID. For use with insulin pens   levETIRAcetam 500 MG tablet Commonly known as:  KEPPRA Take 1 tablet (500 mg total) by mouth 2 (two) times daily.   linaclotide 145 MCG Caps capsule Commonly known as: LINZESS Take 145 mcg by mouth daily before breakfast.   losartan 50 MG tablet Commonly known as: COZAAR Take 50 mg daily by mouth.   nitroGLYCERIN 0.4 MG SL tablet Commonly known as: NITROSTAT Place 1 tablet (0.4 mg total) under the tongue every 5 (five)  minutes as needed for chest pain.   rosuvastatin 5 MG tablet Commonly known as: CRESTOR TAKE 1 TABLET BY MOUTH  DAILY       Follow-up Information    Candi Leash, PA-C. Schedule an appointment as soon as possible for a visit in 2 week(s).   Specialty: Physician Assistant Contact information: [place] [address] [phone]        Herminio Commons, MD .   Specialty: Cardiology Contact information: [place] [address] [phone]        [facility] Follow up.   Specialty: Radiation Oncology Why: as scheduled Contact information: [address] [id number] [place] [address] [phone]       Volanda [facility], MD Follow up.   Specialty: Oncology Why: as scheduled Contact information: [address] [place] [address] [phone]              Allergies  Allergen Reactions  . Atorvastatin     MUSCLE ACHES     Consultations:  Oncology  Pulmonary  IR  Rad Onc  Procedures/Studies: MR BRAIN W WO CONTRAST  Result Date: 09/04/2020 CLINICAL DATA:  CNS neoplasm staging EXAM: MRI HEAD WITHOUT AND WITH CONTRAST TECHNIQUE: Multiplanar, multiecho pulse sequences of the brain and surrounding structures were obtained without and with intravenous contrast. CONTRAST:  49mL GADAVIST GADOBUTROL 1 MMOL/ML IV SOLN COMPARISON:  None. FINDINGS: Brain: 2 similar heterogeneously enhancing left cerebral masses with confluent vasogenic edema spreading throughout the left temporal lobe, parietal lobe, and deep white matter tracks. There is local mass effect with 6 mm midline shift. Largest lesion measures 3.2 cm in the inferior and posterior left temporal lobe with peritumoral cyst posteriorly. The smaller is in the left occipital lobe and measures 14 mm. Small remote bilateral cerebellar infarct. Chronic small vessel ischemia in the cerebral white matter. Vascular:  Normal flow voids and vascular enhancements Skull and upper cervical spine: Normal marrow signal Sinuses/Orbits: Bilateral cataract resection. IMPRESSION: Two left cerebral masses consistent with metastatic disease, the larger measuring 3.2 cm in the posterior temporal lobe. Extensive regional vasogenic edema with 6 mm of midline shift. Electronically Signed   By: Monte Fantasia M.D.   On: 09/04/2020 11:40   CT CHEST ABDOMEN PELVIS W CONTRAST  Result Date: 09/03/2020 CLINICAL DATA:  Brain metastases of unknown primary. EXAM: CT CHEST, ABDOMEN, AND PELVIS WITH CONTRAST TECHNIQUE: Multidetector CT imaging of the chest, abdomen and pelvis was performed following the standard protocol during bolus administration of intravenous contrast. CONTRAST:  137mL OMNIPAQUE IOHEXOL 300 MG/ML  SOLN COMPARISON:  None. FINDINGS: CT CHEST FINDINGS Cardiovascular: No acute findings.  Prior CABG noted. Mediastinum/Lymph Nodes: Mild mediastinal lymphadenopathy is seen in the right paratracheal region, with largest lymph node measuring 1.5 cm. Mild right hilar lymphadenopathy is also seen with largest lymph node measuring 1.7 cm. Lungs/Pleura: A masslike opacity is seen in the central and anterior right upper lobe which measures approximately 4.9 by 3.4 cm on image 21/2. Postobstructive atelectasis or pneumonitis is  also seen in the anterior right upper lobe. A 9 mm pulmonary nodule is also seen in the lateral and inferior right upper lobe abutting the minor fissure on image 53/4. No evidence pleural effusion. Musculoskeletal:  No suspicious bone lesions identified. CT ABDOMEN AND PELVIS FINDINGS Hepatobiliary: No masses identified. Gallbladder is unremarkable. No evidence of biliary ductal dilatation. Pancreas:  No mass or inflammatory changes. Spleen:  Within normal limits in size and appearance. Adrenals/Urinary tract: Normal adrenal glands. A few small renal cysts are noted, however there is no evidence of renal masses or  hydronephrosis. Masses or hydronephrosis. Stomach/Bowel: No evidence of obstruction, inflammatory process, or abnormal fluid collections. Normal appendix visualized. Diverticulosis is seen mainly involving the sigmoid colon, however there is no evidence of diverticulitis. Vascular/Lymphatic: No pathologically enlarged lymph nodes identified. Aortic atherosclerotic calcification noted. 3.3 cm infrarenal abdominal aortic aneurysm is seen. Reproductive:  No mass or other significant abnormality identified. Other:  None. Musculoskeletal:  No suspicious bone lesions identified. IMPRESSION: Mass in the central and anterior right upper lobe measuring approximately 5 cm, with adjacent postobstructive atelectasis or pneumonitis. Differential diagnosis includes primary bronchogenic carcinoma and metastatic disease. Separate 9 mm pulmonary nodule in lateral right upper lobe adjacent to minor fissure, suspicious for synchronous primary carcinoma or metastatic disease. Mild right hilar and mediastinal lymphadenopathy, consistent with metastatic disease. No evidence of abdominal or pelvic metastatic disease. 3.3 cm infrarenal abdominal aortic aneurysm. Consider follow-up ultrasound every 3 years if appropriate. This recommendation follows ACR consensus guidelines: White Paper of the ACR Incidental Findings Committee II on Vascular Findings. J Am Coll Radiol 2013; 10:789-794. Aortic Atherosclerosis (ICD10-I70.0). Electronically Signed   By: Marlaine Hind M.D.   On: 09/03/2020 20:43   US ARTERIAL ABI (SCREENING LOWER EXTREMITY)  Result Date: 08/26/2020 CLINICAL DATA:  Pain in both feet and history of diabetes, hypertension and smoking. EXAM: NONINVASIVE PHYSIOLOGIC VASCULAR STUDY OF BILATERAL LOWER EXTREMITIES TECHNIQUE: Evaluation of both lower extremities were performed at rest, including calculation of ankle-brachial indices with single level Doppler, pressure and pulse volume recording. COMPARISON:  None. FINDINGS: Right  ABI:  0.73 Left ABI:  0.64 Right Lower Extremity: Monophasic posterior tibial and dorsalis pedis waveforms distally. Left Lower Extremity: Monophasic posterior tibial and dorsalis pedis waveforms distally. 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 Consider further evaluation with CT angiography of the abdominal aorta with bilateral iliofemoral runoff for direct imaging of the arterial vasculature. Electronically Signed   By: Aletta Edouard M.D.   On: 08/26/2020 12:03     Subjective: Very eager to go home  Discharge Exam: Vitals:   09/06/20 1523 09/06/20 1545  BP: (!) 153/55 (!) 144/57  Pulse: 69 61  Resp: 19 18  Temp: 97.6 F (36.4 C) (!) 97.5 F (36.4 C)  SpO2: 94% 94%   Vitals:   09/06/20 1504 09/06/20 1510 09/06/20 1523 09/06/20 1545  BP: (!) 162/80 (!) 141/50 (!) 153/55 (!) 144/57  Pulse: 81 81 69 61  Resp: 19 (!) 21 19 18   Temp:   97.6 F (36.4 C) (!) 97.5 F (36.4 C)  TempSrc:   Oral Oral  SpO2: 100% 93% 94% 94%  Weight:      Height:        General: Pt is alert, awake, not in acute distress Cardiovascular: RRR, S1/S2 +, no rubs, no gallops Respiratory: CTA bilaterally, no wheezing, no rhonchi Abdominal: Soft, NT, ND, bowel sounds +  Extremities: no edema, no cyanosis   The results of significant diagnostics from this hospitalization (including imaging, microbiology, ancillary and laboratory) are listed below for reference.     Microbiology: Recent Results (from the past 240 hour(s))  SARS CORONAVIRUS 2 (TAT 6-24 HRS) Nasopharyngeal Nasopharyngeal Swab     Status: None   Collection Time: 09/03/20  3:17 PM   Specimen: Nasopharyngeal Swab  Result Value Ref Range Status   SARS Coronavirus 2 NEGATIVE NEGATIVE Final    Comment: (NOTE) SARS-CoV-2 target nucleic acids are NOT DETECTED.  The SARS-CoV-2 RNA is generally detectable in  upper and lower respiratory specimens during the acute phase of infection. Negative results do not preclude SARS-CoV-2 infection, do not rule out co-infections with other pathogens, and should not be used as the sole basis for treatment or other patient management decisions. Negative results must be combined with clinical observations, patient history, and epidemiological information. The expected result is Negative.  Fact Sheet for Patients: SugarRoll.be  Fact Sheet for Healthcare Providers: https://www.woods-mathews.com/  This test is not yet approved or cleared by the [place] FDA and  has been authorized for detection and/or diagnosis of SARS-CoV-2 by FDA under an Emergency Use Authorization (EUA). This EUA will remain  in effect (meaning this test can be used) for the duration of the COVID-19 declaration under Se ction 564(b)(1) of the Act, 21 U.S.C. section 360bbb-3(b)(1), unless the authorization is terminated or revoked sooner.  Performed at [facility], [place] [address], [place], [place] [address]      Labs: BNP (last 3 results) No results for input(s): BNP in the last 8760 hours. Basic Metabolic Panel: Recent Labs  Lab 09/03/20 1018 09/04/20 0544 09/05/20 1343 09/06/20 0408  NA 135 136  --  135  K 4.4 4.5  --  4.2  CL 97* 101  --  99  CO2 30 25  --  27  GLUCOSE 177* 312* 460* 316*  BUN 20 22  --  33*  CREATININE 0.92 0.81  --  0.90  CALCIUM 10.1 9.5  --  9.4   Liver Function Tests: Recent Labs  Lab 09/03/20 1018 09/06/20 0408  AST 12* 21  ALT 15 31  ALKPHOS 69 71  BILITOT 0.4 0.3  PROT 7.5 6.8  ALBUMIN 3.8 2.9*   No results for input(s): LIPASE, AMYLASE in the last 168 hours. No results for input(s): AMMONIA in the last 168 hours. CBC: Recent Labs  Lab 09/03/20 1018 09/04/20 0544 09/06/20 0408  WBC 16.4* 10.9* 13.2*  NEUTROABS 12.8*  --   --   HGB 11.2* 11.7* 10.2*  HCT 34.4* 37.1* 31.6*   MCV 85.8 88.5 87.1  PLT 321 310 327   Cardiac Enzymes: No results for input(s): CKTOTAL, CKMB, CKMBINDEX, TROPONINI in the last 168 hours. BNP: Invalid input(s): POCBNP CBG: Recent Labs  Lab 09/05/20 1220 09/05/20 1721 09/05/20 2106 09/06/20 0751 09/06/20 1156  GLUCAP 411* 289* 319* 308* 242*   D-Dimer No results for input(s): DDIMER in the last 72 hours. Hgb A1c No results for input(s): HGBA1C in the last 72 hours. Lipid Profile No results for input(s): CHOL, HDL, LDLCALC, TRIG, CHOLHDL, LDLDIRECT in the last 72 hours. Thyroid function studies No results for input(s): TSH, T4TOTAL, T3FREE, THYROIDAB in the last 72 hours.  Invalid input(s): FREET3 Anemia work up Recent Labs    09/06/20 0928  FERRITIN 365*  TIBC 288  IRON 108   Urinalysis No results found for: COLORURINE, APPEARANCEUR, [place], [place], [place],  HGBUR, BILIRUBINUR, KETONESUR, PROTEINUR, UROBILINOGEN, NITRITE, LEUKOCYTESUR Sepsis Labs Invalid input(s): PROCALCITONIN,  WBC,  LACTICIDVEN Microbiology Recent Results (from the past 240 hour(s))  SARS CORONAVIRUS 2 (TAT 6-24 HRS) Nasopharyngeal Nasopharyngeal Swab     Status: None   Collection Time: 09/03/20  3:17 PM   Specimen: Nasopharyngeal Swab  Result Value Ref Range Status   SARS Coronavirus 2 NEGATIVE NEGATIVE Final    Comment: (NOTE) SARS-CoV-2 target nucleic acids are NOT DETECTED.  The SARS-CoV-2 RNA is generally detectable in upper and lower respiratory specimens during the acute phase of infection. Negative results do not preclude SARS-CoV-2 infection, do not rule out co-infections with other pathogens, and should not be used as the sole basis for treatment or other patient management decisions. Negative results must be combined with clinical observations, patient history, and epidemiological information. The expected result is Negative.  Fact Sheet for Patients: SugarRoll.be  Fact Sheet for Healthcare  Providers: https://www.woods-mathews.com/  This test is not yet approved or cleared by the [place] FDA and  has been authorized for detection and/or diagnosis of SARS-CoV-2 by FDA under an Emergency Use Authorization (EUA). This EUA will remain  in effect (meaning this test can be used) for the duration of the COVID-19 declaration under Se ction 564(b)(1) of the Act, 21 U.S.C. section 360bbb-3(b)(1), unless the authorization is terminated or revoked sooner.  Performed at [facility], [place] [address] [place], [place] [address]    Time spent: 30 min  SIGNED:   Marylu Lund, MD  [facility] 09/06/2020, 5:22 PM  If 7PM-7AM, please contact night-coverage

## 2020-09-06 NOTE — Telephone Encounter (Signed)
Noted. Nothing further needed. 

## 2020-09-06 NOTE — Anesthesia Procedure Notes (Signed)
Procedure Name: Intubation Date/Time: 09/06/2020 1:55 PM Performed by: Lavina Hamman, CRNA Pre-anesthesia Checklist: Patient identified, Emergency Drugs available, Suction available, Patient being monitored and Timeout performed Patient Re-evaluated:Patient Re-evaluated prior to induction Oxygen Delivery Method: Circle system utilized Preoxygenation: Pre-oxygenation with 100% oxygen Induction Type: IV induction Ventilation: Mask ventilation without difficulty Laryngoscope Size: Mac and 4 Grade View: Grade I Tube type: Oral Tube size: 9.0 mm Number of attempts: 1 Airway Equipment and Method: Stylet Placement Confirmation: ETT inserted through vocal cords under direct vision,  positive ETCO2,  CO2 detector and breath sounds checked- equal and bilateral Secured at: 22 cm Tube secured with: Tape Dental Injury: Teeth and Oropharynx as per pre-operative assessment

## 2020-09-06 NOTE — Telephone Encounter (Signed)
IF patient is inpatient The office does not schedule these. The floor nurse would be scheduling these.

## 2020-09-06 NOTE — Progress Notes (Signed)
Inpatient Diabetes Program Recommendations  AACE/ADA: New Consensus Statement on Inpatient Glycemic Control (2015)  Target Ranges:  Prepandial:   less than 140 mg/dL      Peak postprandial:   less than 180 mg/dL (1-2 hours)      Critically ill patients:  140 - 180 mg/dL   Lab Results  Component Value Date   GLUCAP 308 (H) 09/06/2020   HGBA1C 8.3 (H) 09/03/2020    Review of Glycemic Control  Diabetes history: DM2 Outpatient Diabetes medications: glipizide 10 mg QD, metformin 1000 mg BID Current orders for Inpatient glycemic control: Lantus 10 units QD, Novolog 0-20 units TID with meals and 0-5 HS + 3 units TID  On Decadron 10 mg Q8H. Blood sugars today: 316, 306 mg/dL  Inpatient Diabetes Program Recommendations:     Increase Lantus to 14 units QD Increase Novolog to 5 units TID with meals if eating > 50% meal  Will follow glucose trends.   Thank you. Lorenda Peck, RD, LDN, CDE Inpatient Diabetes Coordinator [phone]

## 2020-09-06 NOTE — Progress Notes (Signed)
Discharge instructions explained to daughter and wife. Prescriptions given to the daughter in case there's a problem with it being called in to pharmacy. Starter insulin pen kit given to daughter, she will be giving the injections. Next due medication times written on discharge papers. Daughter instructed on how to use insulin pen. Because we didn't have any insulin pens she gave him his insulin with a syringe in his Right upper Abdomen. She did very well. Daughter states she used to use an insulin pen along time ago so she is familiar with how to use it. Multiple educational papers about diabetes given to the family. Patient discharged via wheelchair to family.

## 2020-09-06 NOTE — Progress Notes (Signed)
Bryce Powell is doing okay.  As always, he wants to try to go home.  It sounds like he may have a bronchoscopy today so we will try to get a tissue diagnosis.  His blood sugars are quite high because of the steroids.  We will have to get Radiation Oncology to see him so they can plan for radiation therapy.  He may have actual radiosurgery.  There is no pain.  Therapy is a little bit anemic.  This might be from the underlying malignancy.  He may be anemia of chronic disease.  His renal function is doing okay.  He seems to be eating decent.  He is having no nausea or vomiting.  His temperature is 97.6.  Pulse 54.  Blood pressure 166/56.  His lungs are clear.  There may be some decreased over on the right side.  Cardiac exam regular rate and rhythm.  Abdomen is soft.  Bowel sounds are present.  Neurological exam shows no focal deficits.  He is maybe a little bit disoriented but pleasant.  He has good movement of his extremities.  Again, we have to get a tissue diagnosis.  He has to be seen by Radiation Oncology.  He lives up in, I think, [place].  I suspect that if he needs treatment, he will get radiation at the [facility] in [place].  As far as systemic therapy, he could always go to the [facility] in [place] or to the [facility] in [facility] in [place].   Lattie Haw, MD  Acts 14:13

## 2020-09-06 NOTE — Anesthesia Postprocedure Evaluation (Signed)
Anesthesia Post Note  Patient: Prestin Munch  Procedure(s) Performed: ENDOBRONCHIAL ULTRASOUND (Bilateral ) VIDEO BRONCHOSCOPY WITHOUT FLUORO (N/A ) BRONCHIAL WASHINGS BRONCHIAL NEEDLE ASPIRATION BIOPSIES BRONCHIAL BRUSHINGS BRONCHIAL BIOPSIES     Patient location during evaluation: Endoscopy Anesthesia Type: General Level of consciousness: awake and alert Pain management: pain level controlled Vital Signs Assessment: post-procedure vital signs reviewed and stable Respiratory status: spontaneous breathing, nonlabored ventilation, respiratory function stable and patient connected to nasal cannula oxygen Cardiovascular status: blood pressure returned to baseline and stable Postop Assessment: no apparent nausea or vomiting Anesthetic complications: no   No complications documented.  Last Vitals:  Vitals:   09/06/20 1510 09/06/20 1523  BP: (!) 141/50 (!) 153/55  Pulse: 81 69  Resp: (!) 21 19  Temp:  36.4 C  SpO2: 93% 94%    Last Pain:  Vitals:   09/06/20 1523  TempSrc: Oral  PainSc: 0-No pain                 Seini Lannom,Dravyn COKER

## 2020-09-06 NOTE — Transfer of Care (Signed)
Immediate Anesthesia Transfer of Care Note  Patient: Rolf Fells  Procedure(s) Performed: ENDOBRONCHIAL ULTRASOUND (Bilateral ) VIDEO BRONCHOSCOPY WITHOUT FLUORO (N/A ) BRONCHIAL WASHINGS BRONCHIAL NEEDLE ASPIRATION BIOPSIES BRONCHIAL BRUSHINGS BRONCHIAL BIOPSIES  Patient Location: PACU  Anesthesia Type:General  Level of Consciousness: awake  Airway & Oxygen Therapy: Patient Spontanous Breathing and Patient connected to face mask oxygen  Post-op Assessment: Report given to RN and Post -op Vital signs reviewed and stable  Post vital signs: Reviewed and stable  Last Vitals:  Vitals Value Taken Time  BP 162/80 09/06/20 1503  Temp    Pulse 77 09/06/20 1507  Resp 23 09/06/20 1507  SpO2 95 % 09/06/20 1507  Vitals shown include unvalidated device data.  Last Pain:  Vitals:   09/06/20 1242  TempSrc: Oral  PainSc: 0-No pain         Complications: No complications documented.

## 2020-09-06 NOTE — Progress Notes (Signed)
NAME:  Bryce Powell, MRN:  [id number], DOB:  1941-02-04, LOS: 3 ADMISSION DATE:  09/03/2020, CONSULTATION DATE:  04/24./22 REFERRING MD:  DR Wyline Copas, CHIEF COMPLAINT:  Lung mass and brain mets   History of Present Illness:    80 year old male smoker.  No prior history of lung lung cancer screening.  80 pound weight loss in 1 year and intermittent headaches and mental confusion.  No history of hemoptysis.  No chronic blood loss or bleeding.  No shortness of breath.  Reports decline in memory for 1 month and chronic cough.  And then the weight loss.  CT scan of the chest shows nearly 5 cm right upper lobe mass [CT scan personally visualized and right upper lobe airway leading directly into the mass] that appears somewhat necrotic and CCM personal visualization opinion.  CT scan brain showed several metastatic lesions according to the hospitalist and Decadron has been started.  Keppra also started.  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  CT scan chest also shows possible mediastinal adenopathy.  No PET scan available because he is inpatient.  According to the hospitalist interventional radiology recommended bronchoscopy method of biopsy.  Therefore pulmonary has been consulted.  Patient is itching to go home.  At home he is not using any oxygen.  No specific diagnosis of COPD although he is a heavy smoker.  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   reports that he has been smoking cigars. He has a 120.00 pack-year smoking history. He has never used smokeless tobacco.  Past Surgical History:  Procedure Laterality Date  . CORONARY ARTERY BYPASS GRAFT N/A 03/30/2017   Procedure: CORONARY ARTERY BYPASS GRAFTING (CABG) x  Three, using left internal mammary artery and right leg greater saphenous vein harvested endoscopically;  Surgeon: Rexene Alberts, MD;  Location: [facility];  Service: Open Heart Surgery;  Laterality: N/A;  . LEFT HEART CATH AND CORONARY ANGIOGRAPHY N/A 03/29/2017   Procedure: LEFT HEART CATH AND CORONARY ANGIOGRAPHY;  Surgeon: Lorretta Harp, MD;  Location: [facility];  Service: Cardiovascular;  Laterality: N/A;  . TEE WITHOUT CARDIOVERSION N/A 03/30/2017   Procedure: TRANSESOPHAGEAL ECHOCARDIOGRAM (TEE);  Surgeon: Rexene Alberts, MD;  Location: [facility];  Service: Open Heart Surgery;  Laterality: N/A;    Allergies  Allergen Reactions  . Atorvastatin     MUSCLE ACHES     There is no immunization history for the selected administration types on file for this patient.  Family History  Problem Relation Age of Onset  . Diabetes Mother   . Heart attack Mother   . Heart attack Father      Current Facility-Administered Medications:  .  0.9 %  sodium chloride infusion, 250 mL, Intravenous, PRN, Derrill Kay A, MD .  albuterol (VENTOLIN HFA) 108 (90 Base) MCG/ACT inhaler 2 puff, 2 puff, Inhalation, Q6H PRN, Chase Caller, Murali, MD .  amLODipine (NORVASC) tablet 5 mg, 5 mg, Oral, Daily, Donne Hazel, MD, 5 mg at 09/05/20 1106 .  dexamethasone (DECADRON) injection 10 mg, 10 mg, Intravenous, Q8H, Derrill Kay A, MD, 10 mg at 09/06/20 0349 .  docusate sodium (COLACE) capsule 100 mg, 100 mg, Oral, BID, Donne Hazel, MD,  100 mg at 09/05/20 2113 .  feeding supplement (NEPRO CARB STEADY) liquid 237 mL, 237 mL, Oral, TID BM, Donne Hazel, MD, 237 mL at 09/05/20 2112 .  insulin aspart (novoLOG) injection 0-20 Units, 0-20 Units, Subcutaneous, TID WC, Donne Hazel, MD, 11 Units at 09/05/20 1813 .  insulin aspart (novoLOG) injection 0-5 Units, 0-5 Units, Subcutaneous, QHS, Donne Hazel, MD, 4 Units at 09/05/20 2112 .  insulin aspart (novoLOG) injection 3 Units, 3 Units, Subcutaneous,  TID WC, Donne Hazel, MD, 3 Units at 09/05/20 1814 .  insulin glargine (LANTUS) injection 10 Units, 10 Units, Subcutaneous, Daily, Donne Hazel, MD, 10 Units at 09/05/20 1823 .  levETIRAcetam (KEPPRA) tablet 500 mg, 500 mg, Oral, BID, Derrill Kay A, MD, 500 mg at 09/05/20 2113 .  lip balm (CARMEX) ointment, , , ,  .  multivitamin with minerals tablet 1 tablet, 1 tablet, Oral, Daily, Donne Hazel, MD .  nicotine (NICODERM CQ - dosed in mg/24 hours) patch 14 mg, 14 mg, Transdermal, Daily, Donne Hazel, MD, 14 mg at 09/05/20 1107 .  polyethylene glycol (MIRALAX / GLYCOLAX) packet 17 g, 17 g, Oral, Daily, Donne Hazel, MD, 17 g at 09/05/20 1106 .  revefenacin (YUPELRI) nebulizer solution 175 mcg, 175 mcg, Nebulization, Daily, Ramaswamy, Murali, MD .  sodium chloride flush (NS) 0.9 % injection 3 mL, 3 mL, Intravenous, Q12H, Derrill Kay A, MD, 3 mL at 09/05/20 2113 .  sodium chloride flush (NS) 0.9 % injection 3 mL, 3 mL, Intravenous, PRN, Phillips Grout, MD, 3 mL at 09/03/20 2134   Significant Hospital Events: Including procedures, antibiotic start and stop dates in addition to other pertinent events   .   Interim History / Subjective:   Denies chest pain, dyspnea or hemoptysis. Wife and daughter at bedside. Afebrile  Objective   Blood pressure (!) 166/56, pulse (!) 54, temperature 97.6 F (36.4 C), temperature source Oral, resp. rate 15, height 6' (1.829 m), weight 89.6 kg, SpO2 98 %.        Intake/Output Summary (Last 24 hours) at 09/06/2020 0847 Last data filed at 09/06/2020 0725 Gross per 24 hour  Intake 300 ml  Output 2820 ml  Net -2520 ml   Filed Weights   09/03/20 1851 09/06/20 0520  Weight: 95 kg 89.6 kg    Examination: General: Somewhat frail but pleasant elderly gentleman.  Looks somewhat disheveled HENT: No elevated JVP.  No neck nodes Lungs: No accessory muscle use, decreased breath sounds bilateral, barrel chest cardiovascular: S1-S2 regular, no  murmur , midline sternotomy scar Abdomen: Soft nontender no organomegaly Extremities: No cyanosis.  No edema.  Clubbing present Neuro: Has a wobbly gait but is able to walk.  This is baseline.  Alert and oriented x3. GU: Looks normal  Labs/imaging that I havepersonally reviewed  (right click and "Reselect all SmartList Selections" daily)   ABG reviewed, no hypercarbia  LABS    PULMONARY Recent Labs  Lab 09/05/20 1107  PHART 7.438  PCO2ART 33.6  PO2ART 93.3  HCO3 22.3  O2SAT 96.5    CBC Recent Labs  Lab 09/03/20 1018 09/04/20 0544 09/06/20 0408  HGB 11.2* 11.7* 10.2*  HCT 34.4* 37.1* 31.6*  WBC 16.4* 10.9* 13.2*  PLT 321 310 327    COAGULATION Recent Labs  Lab 09/05/20 0556  INR 1.0    CARDIAC  No results for input(s): TROPONINI in the last 168 hours. No results for input(s): PROBNP in the last 168 hours.  CHEMISTRY Recent Labs  Lab 09/03/20 1018 09/04/20 0544 09/05/20 1343 09/06/20 0408  NA 135 136  --  135  K 4.4 4.5  --  4.2  CL 97* 101  --  99  CO2 30 25  --  27  GLUCOSE 177* 312* 460* 316*  BUN 20 22  --  33*  CREATININE 0.92 0.81  --  0.90  CALCIUM 10.1 9.5  --  9.4   Estimated Creatinine Clearance: 73 mL/min (by C-G formula based on SCr of 0.9 mg/dL).   LIVER Recent Labs  Lab 09/03/20 1018 09/05/20 0556 09/06/20 0408  AST 12*  --  21  ALT 15  --  31  ALKPHOS 69  --  71  BILITOT 0.4  --  0.3  PROT 7.5  --  6.8  ALBUMIN 3.8  --  2.9*  INR  --  1.0  --      INFECTIOUS No results for input(s): LATICACIDVEN, PROCALCITON in the last 168 hours.   ENDOCRINE CBG (last 3)  Recent Labs    09/05/20 1721 09/05/20 2106 09/06/20 0751  GLUCAP 289* 319* 308*         IMAGING x48h  - image(s) personally visualized  -   highlighted in bold MR BRAIN W WO CONTRAST  Result Date: 09/04/2020 CLINICAL DATA:  CNS neoplasm staging EXAM: MRI HEAD WITHOUT AND WITH CONTRAST TECHNIQUE: Multiplanar, multiecho pulse sequences of the brain  and surrounding structures were obtained without and with intravenous contrast. CONTRAST:  100mL GADAVIST GADOBUTROL 1 MMOL/ML IV SOLN COMPARISON:  None. FINDINGS: Brain: 2 similar heterogeneously enhancing left cerebral masses with confluent vasogenic edema spreading throughout the left temporal lobe, parietal lobe, and deep white matter tracks. There is local mass effect with 6 mm midline shift. Largest lesion measures 3.2 cm in the inferior and posterior left temporal lobe with peritumoral cyst posteriorly. The smaller is in the left occipital lobe and measures 14 mm. Small remote bilateral cerebellar infarct. Chronic small vessel ischemia in the cerebral white matter. Vascular: Normal flow voids and vascular enhancements Skull and upper cervical spine: Normal marrow signal Sinuses/Orbits: Bilateral cataract resection. IMPRESSION: Two left cerebral masses consistent with metastatic disease, the larger measuring 3.2 cm in the posterior temporal lobe. Extensive regional vasogenic edema with 6 mm of midline shift. Electronically Signed   By: Monte Fantasia M.D.   On: 09/04/2020 11:40     Resolved Hospital Problem list   x  Assessment & Plan:   Right upper lobe 4.9 cm lung mass with necrotic features.    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 -Expect to see endobronchial lesion right upper lobe and we will also perform TB NA of 4R/paratracheal and 7R right hilar lymph node -The various options of biopsy including bronchoscopy, CT guided needle aspiration and surgical biopsy were discussed.The risks of each procedure including coughing, bleeding and the  chances of lung puncture requiring chest tube were discussed in great detail. The benefits & alternatives including serial follow up were also discussed.   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 Meanwhile continue Decadron  -Daughter wants to continue care at cancer center at [place] to defer PFTs as outpatient. -Smoking cessation strongly emphasized  SIGNATURE   Kara Mead MD. [place] Flood.  Pulmonary & Critical care Pager : [phone]  If no response to pager , please call [phone] until 7 pm After 7:00 pm call Elink  [phone]      8:47 AM 09/06/2020

## 2020-09-06 NOTE — Op Note (Addendum)
  Name:  [name] MRN:  [id number] DOB:  July 11, 1940  PROCEDURE NOTE  Procedure(s): Flexible bronchoscopy [phone]) Brushing [phone]) of the RUL & RLL Bronchial alveolar lavage (72620) of the RUL Endobronchial biopsy (35597) of the RUL  Endobronchial ultrasound (41638) Transbronchial needle aspiration (45364) of the 4R LN  Indications:  RUL mass + mediastinal lymphadenopathy + brain lesions  Consent:  Written informed consent was obtained prior to the procedure. The risks of the procedure including coughing, bleeding and the small chance of lung puncture requiring chest tube were discussed in great detail. The benefits & alternatives including serial follow up were also discussed.  Anesthesia:  General endotracheal.  Procedure summary:  Appropriate equipment was assembled.  The patient was  identified as Bryce Powell. Interim history obtained and brought to the operating room. Safety timeout was performed. The patient was placed supine on the operating table, airway established and general anesthesia administered by Anesthesia team.   After the appropriate level of anesthesia was assured, flexible video bronchoscope was lubricated and inserted through the endotracheal tube.    Airway examination was performed bilaterally to subsegmental level.  Copious purulent phlegm suctioned from RUL  mucosa appeared normal and endobronchial lesion was identified obstructing anterior sub segmentof RUL. A white lesion was also noted in superior segment of RLL  Endobronchial ultrasound video bronchoscope was then lubricated and inserted through the endotracheal tube. Surveillance of the mediastinal and and bilateral hilar lymph node stations was performed.  Pathologically enlarged lymph nodes were noted at 4R.  Endobronchial ultrasound guided transbronchial needle aspiration of 4R  (passes x 4) was performed, after which EBUS bronchoscope was withdrawn.  Flexible video bronchoscope was used again  to perform brushings of RUL & RLL lesions & random endobronchial mucosal biopsies.  After ensuring hemostasis , the bronchoscope was withdrawn.  The patient was extubated in endoscopy  and transferred to recovery area.   Specimens sent: Bronchial alveolar lavage specimen of the RUL for  microbiology and cytology. Brushings of RUL & RLL TBNA of 4R  for cytology Endobronchial biopsy for path   Complications:  No immediate complications were noted.  Hemodynamic parameters and oxygenation remained stable throughout the procedure.  Estimated blood loss:  Less then 10 mL.  Recommend - rad onc consult Augmentin x 14 days for post obstructive pneumonia   Kara Mead MD. FCCP. [facility] Pager [phone] If no response call [phone]   09/06/2020 2:51 PM

## 2020-09-07 ENCOUNTER — Other Ambulatory Visit: Payer: Self-pay | Admitting: Radiation Therapy

## 2020-09-07 ENCOUNTER — Encounter (HOSPITAL_COMMUNITY): Payer: Self-pay | Admitting: Pulmonary Disease

## 2020-09-07 ENCOUNTER — Telehealth: Payer: Self-pay

## 2020-09-07 DIAGNOSIS — C7931 Secondary malignant neoplasm of brain: Secondary | ICD-10-CM

## 2020-09-07 LAB — SURGICAL PATHOLOGY

## 2020-09-07 LAB — CYTOLOGY - NON PAP

## 2020-09-07 LAB — ERYTHROPOIETIN: Erythropoietin: 5.2 m[IU]/mL (ref 2.6–18.5)

## 2020-09-07 NOTE — Telephone Encounter (Signed)
No 09/03/20 los noted   [employer]

## 2020-09-08 ENCOUNTER — Ambulatory Visit
Admission: RE | Admit: 2020-09-08 | Discharge: 2020-09-08 | Disposition: A | Discharge status: Home / Self Care | Payer: Medicare Other | Source: Ambulatory Visit | Attending: Radiation Oncology | Admitting: Radiation Oncology

## 2020-09-08 ENCOUNTER — Other Ambulatory Visit: Payer: Self-pay

## 2020-09-08 ENCOUNTER — Other Ambulatory Visit: Payer: Self-pay | Admitting: Radiation Therapy

## 2020-09-08 ENCOUNTER — Encounter: Payer: Self-pay | Admitting: Radiation Oncology

## 2020-09-08 DIAGNOSIS — C7931 Secondary malignant neoplasm of brain: Secondary | ICD-10-CM

## 2020-09-08 MED ORDER — GADOBENATE DIMEGLUMINE 529 MG/ML IV SOLN
18.0000 mL | Freq: Once | INTRAVENOUS | Status: AC | PRN
  Administered 2020-09-08: 18 mL via INTRAVENOUS

## 2020-09-08 NOTE — Progress Notes (Addendum)
Radiation Oncology         [phone] ________________________________  Initial Outpatient Consultation - Conducted via telephone due to current COVID-19 concerns for limiting patient exposure  I spoke with the patient to conduct this consult visit via telephone to spare the patient unnecessary potential exposure in the healthcare setting during the current COVID-19 pandemic. The patient was notified in advance and was offered a [place] meeting to allow for face to face communication but unfortunately reported that they did not have the appropriate resources/technology to support such a visit and instead preferred to proceed with a telephone consult.     Name: Bryce Powell        MRN: [id number]  Date of Service: 09/08/2020 DOB: 1940-10-27  TG:[id number], Roma Schanz, PA-C     REFERRING PHYSICIAN: Candi Leash, PA-C   DIAGNOSIS: The encounter diagnosis was Metastasis to brain [facility]).   HISTORY OF PRESENT ILLNESS: Bryce Powell is a 80 y.o. male consulted in the hospital on 09/06/20 at the request of Dr. Wyline Copas for newly noted disease in the brain and likely stage IV lung cancer. The patient has had steady decline over the last year including up to an 80 pound weight loss and progressive shortness of breath, and weakness in his legs and an episode of incontinence. He was seen by PCP and some type of imaging was done that sent him to the ED on 08/26/20 at [facility]. CT of the head showed a 3.2 cm mass in the left temporal lobe.  He was offered surgery by neurosurgery in the ED, and decided to forego this and leave the hospital.  He did not get admitted.  He presented however on 09/03/2020 to [facility] ED. His symptoms were evaluated and he was offered CT chest abdomen pelvis which revealed a mass in the central and anterior right upper lobe measuring up to 5 cm with adjacent postobstructive atelectasis/pneumonitis as well as right hilar and mediastinal adenopathy, no additional evidence of  metastases were identified in the abdomen or pelvis.  An MRI with and without contrast on 09/04/2020 showed upwards of of a 3.2 cm lesion in the left temporal lobe with peritumoral cyst posteriorly measuring 14 mm.  Mass-effect with a 6 mm midline shift and confluent vasogenic edema was identified.  He was started on Keppra and is currently on dexamethasone injection.  His case was discussed in brain oncology conference this morning, and further work-up is recommended.  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 He is contacted again today to discuss fractionated stereotacic radiosurgery [facility]) as he has declined surgical resection.   PREVIOUS RADIATION THERAPY: No   PAST MEDICAL HISTORY:  Past Medical History:  Diagnosis Date  . Current smoker   . Diabetes ([place])   . HTN (hypertension)   . PAF (paroxysmal atrial fibrillation) ([place])    post CABG  . 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 Sleep apnea   . Tobacco abuse        PAST SURGICAL HISTORY: Past Surgical History:  Procedure Laterality Date  . BRONCHIAL BIOPSY  09/06/2020   Procedure: BRONCHIAL BIOPSIES;  Surgeon: Rigoberto Noel, MD;  Location: [facility];  Service: Cardiopulmonary;;  . BRONCHIAL BRUSHINGS  09/06/2020   Procedure: BRONCHIAL BRUSHINGS;  Surgeon: Rigoberto Noel, MD;  Location: [facility];  Service: Cardiopulmonary;;  . BRONCHIAL NEEDLE ASPIRATION BIOPSY  09/06/2020   Procedure: BRONCHIAL NEEDLE ASPIRATION BIOPSIES;  Surgeon: Rigoberto Noel, MD;  Location: [facility];  Service: Cardiopulmonary;;  . BRONCHIAL WASHINGS  09/06/2020   Procedure: BRONCHIAL WASHINGS;  Surgeon: Rigoberto Noel, MD;  Location: [facility];  Service: Cardiopulmonary;;  BAL  . CORONARY ARTERY BYPASS GRAFT N/A 03/30/2017   Procedure: CORONARY ARTERY BYPASS GRAFTING (CABG) x Three, using left internal  mammary artery and right leg greater saphenous vein harvested endoscopically;  Surgeon: Rexene Alberts, MD;  Location: [place];  Service: Open Heart Surgery;  Laterality: N/A;  . ENDOBRONCHIAL ULTRASOUND Bilateral 09/06/2020   Procedure: ENDOBRONCHIAL ULTRASOUND;  Surgeon: Rigoberto Noel, MD;  Location: [facility];  Service: Cardiopulmonary;  Laterality: Bilateral;  . LEFT HEART CATH AND CORONARY ANGIOGRAPHY N/A 03/29/2017   Procedure: LEFT HEART CATH AND CORONARY ANGIOGRAPHY;  Surgeon: Lorretta Harp, MD;  Location: [place] [facility];  Service: Cardiovascular;  Laterality: N/A;  . TEE WITHOUT CARDIOVERSION N/A 03/30/2017   Procedure: TRANSESOPHAGEAL ECHOCARDIOGRAM (TEE);  Surgeon: Rexene Alberts, MD;  Location: [place];  Service: Open Heart Surgery;  Laterality: N/A;  . VIDEO BRONCHOSCOPY N/A 09/06/2020   Procedure: VIDEO BRONCHOSCOPY WITHOUT FLUORO;  Surgeon: Rigoberto Noel, MD;  Location: [facility];  Service: Cardiopulmonary;  Laterality: N/A;     FAMILY HISTORY:  Family History  Problem Relation Age of Onset  . Diabetes Mother   . Heart attack Mother   . Heart attack Father      SOCIAL HISTORY:  reports that he has been smoking cigars. He has a 120.00 pack-year smoking history. He has never used smokeless tobacco. He reports that he does not drink alcohol and does not use drugs.   ALLERGIES: Atorvastatin   MEDICATIONS:  Current Outpatient Medications  Medication Sig Dispense Refill  . acetaminophen (TYLENOL) 500 MG tablet Take 2 tablets (1,000 mg total) by mouth every 6 (six) hours. (Patient taking differently: Take 1,000 mg by mouth every 6 (six) hours as needed for moderate pain.) 30 tablet 0  . allopurinol (ZYLOPRIM) 300 MG tablet Take 1 tablet (300 mg total) by mouth daily. 30 tablet 5  . amLODipine (NORVASC) 5 MG tablet Take 5 mg by mouth daily.    Marland Kitchen amoxicillin-clavulanate (AUGMENTIN) 875-125 MG tablet Take 1 tablet by mouth every 12 (twelve) hours for 14 days. 28  tablet 0  . dexamethasone (DECADRON) 4 MG tablet Take 1 tablet (4 mg total) by mouth 3 (three) times daily. 90 tablet 0  . insulin glargine (LANTUS) 100 UNIT/ML Solostar Powell Inject 14 Units into the skin daily. 15 mL 0  . insulin lispro (HUMALOG KWIKPEN) 100 UNIT/ML KwikPen Inject 5 Units into the skin 3 (three) times daily. 15 mL 0  . Insulin Powell Needle 31G X 5 MM MISC 1 Device by Does not apply route QID. For use with insulin pens 100 each 0  . levETIRAcetam (KEPPRA) 500 MG tablet Take 1 tablet (500 mg total) by mouth 2 (two) times daily. 60 tablet 0  . linaclotide (LINZESS) 145 MCG CAPS capsule Take 145 mcg by mouth daily before breakfast.    . losartan (COZAAR) 50 MG tablet Take 50 mg daily by mouth.    . rosuvastatin (CRESTOR) 5 MG tablet TAKE 1 TABLET BY MOUTH  DAILY 90 tablet 1  . nitroGLYCERIN (NITROSTAT) 0.4 MG SL tablet Place 1 tablet (0.4 mg total) under the tongue every 5 (five) minutes as needed for chest  pain. 25 tablet 3   No current facility-administered medications for this encounter.     REVIEW OF SYSTEMS: On review of systems, the patient reports that he is doing well overall. He is taking his dexamethasone and tolerating this well. No episodes of confusion, seizure activity, or loss of control of movement or sensation are noted. No other progressive breathing concerns or hemoptysis is noted.      PHYSICAL EXAM:  Unable to assess due to encounter type.   ECOG = 1  0 - Asymptomatic (Fully active, able to carry on all predisease activities without restriction)  1 - Symptomatic but completely ambulatory (Restricted in physically strenuous activity but ambulatory and able to carry out work of a light or sedentary nature. For example, light housework, office work)  2 - Symptomatic, <50% in bed during the day (Ambulatory and capable of all self care but unable to carry out any work activities. Up and about more than 50% of waking hours)  3 - Symptomatic, >50% in bed, but  not bedbound (Capable of only limited self-care, confined to bed or chair 50% or more of waking hours)  4 - Bedbound (Completely disabled. Cannot carry on any self-care. Totally confined to bed or chair)  5 - Death   Bryce Powell MM, Creech RH, Tormey DC, et al. [phone]). "Toxicity and response criteria of the [facility] Group". [place] Oncol. 5 (6): 649-55    LABORATORY DATA:  Lab Results  Component Value Date   WBC 13.2 (H) 09/06/2020   HGB 10.2 (L) 09/06/2020   HCT 31.6 (L) 09/06/2020   MCV 87.1 09/06/2020   PLT 327 09/06/2020   Lab Results  Component Value Date   NA 135 09/06/2020   K 4.2 09/06/2020   CL 99 09/06/2020   CO2 27 09/06/2020   Lab Results  Component Value Date   ALT 31 09/06/2020   AST 21 09/06/2020   ALKPHOS 71 09/06/2020   BILITOT 0.3 09/06/2020      RADIOGRAPHY: MR BRAIN W WO CONTRAST  Result Date: 09/04/2020 CLINICAL DATA:  CNS neoplasm staging EXAM: MRI HEAD WITHOUT AND WITH CONTRAST TECHNIQUE: Multiplanar, multiecho pulse sequences of the brain and surrounding structures were obtained without and with intravenous contrast. CONTRAST:  2m GADAVIST GADOBUTROL 1 MMOL/ML IV SOLN COMPARISON:  None. FINDINGS: Brain: 2 similar heterogeneously enhancing left cerebral masses with confluent vasogenic edema spreading throughout the left temporal lobe, parietal lobe, and deep white matter tracks. There is local mass effect with 6 mm midline shift. Largest lesion measures 3.2 cm in the inferior and posterior left temporal lobe with peritumoral cyst posteriorly. The smaller is in the left occipital lobe and measures 14 mm. Small remote bilateral cerebellar infarct. Chronic small vessel ischemia in the cerebral white matter. Vascular: Normal flow voids and vascular enhancements Skull and upper cervical spine: Normal marrow signal Sinuses/Orbits: Bilateral cataract resection. IMPRESSION: Two left cerebral masses consistent with metastatic disease, the larger  measuring 3.2 cm in the posterior temporal lobe. Extensive regional vasogenic edema with 6 mm of midline shift. Electronically Signed   By: JMonte FantasiaM.D.   On: 09/04/2020 11:40   CT CHEST ABDOMEN PELVIS W CONTRAST  Result Date: 09/03/2020 CLINICAL DATA:  Brain metastases of unknown primary. EXAM: CT CHEST, ABDOMEN, AND PELVIS WITH CONTRAST TECHNIQUE: Multidetector CT imaging of the chest, abdomen and pelvis was performed following the standard protocol during bolus administration of intravenous contrast. CONTRAST:  1045mOMNIPAQUE IOHEXOL 300 MG/ML  SOLN COMPARISON:  None. FINDINGS: CT CHEST FINDINGS Cardiovascular: No acute findings.  Prior CABG noted. Mediastinum/Lymph Nodes: Mild mediastinal lymphadenopathy is seen in the right paratracheal region, with largest lymph node measuring 1.5 cm. Mild right hilar lymphadenopathy is also seen with largest lymph node measuring 1.7 cm. Lungs/Pleura: A masslike opacity is seen in the central and anterior right upper lobe which measures approximately 4.9 by 3.4 cm on image 21/2. Postobstructive atelectasis or pneumonitis is also seen in the anterior right upper lobe. A 9 mm pulmonary nodule is also seen in the lateral and inferior right upper lobe abutting the minor fissure on image 53/4. No evidence pleural effusion. Musculoskeletal:  No suspicious bone lesions identified. CT ABDOMEN AND PELVIS FINDINGS Hepatobiliary: No masses identified. Gallbladder is unremarkable. No evidence of biliary ductal dilatation. Pancreas:  No mass or inflammatory changes. Spleen:  Within normal limits in size and appearance. Adrenals/Urinary tract: Normal adrenal glands. A few small renal cysts are noted, however there is no evidence of renal masses or hydronephrosis. Masses or hydronephrosis. Stomach/Bowel: No evidence of obstruction, inflammatory process, or abnormal fluid collections. Normal appendix visualized. Diverticulosis is seen mainly involving the sigmoid colon, however  there is no evidence of diverticulitis. Vascular/Lymphatic: No pathologically enlarged lymph nodes identified. Aortic atherosclerotic calcification noted. 3.3 cm infrarenal abdominal aortic aneurysm is seen. Reproductive:  No mass or other significant abnormality identified. Other:  None. Musculoskeletal:  No suspicious bone lesions identified. IMPRESSION: Mass in the central and anterior right upper lobe measuring approximately 5 cm, with adjacent postobstructive atelectasis or pneumonitis. Differential diagnosis includes primary bronchogenic carcinoma and metastatic disease. Separate 9 mm pulmonary nodule in lateral right upper lobe adjacent to minor fissure, suspicious for synchronous primary carcinoma or metastatic disease. Mild right hilar and mediastinal lymphadenopathy, consistent with metastatic disease. No evidence of abdominal or pelvic metastatic disease. 3.3 cm infrarenal abdominal aortic aneurysm. Consider follow-up ultrasound every 3 years if appropriate. This recommendation follows ACR consensus guidelines: White Paper of the ACR Incidental Findings Committee II on Vascular Findings. J Am Coll Radiol 2013; 10:789-794. Aortic Atherosclerosis (ICD10-I70.0). Electronically Signed   By: Marlaine Hind M.D.   On: 09/03/2020 20:43   US ARTERIAL ABI (SCREENING LOWER EXTREMITY)  Result Date: 08/26/2020 CLINICAL DATA:  Pain in both feet and history of diabetes, hypertension and smoking. EXAM: NONINVASIVE PHYSIOLOGIC VASCULAR STUDY OF BILATERAL LOWER EXTREMITIES TECHNIQUE: Evaluation of both lower extremities were performed at rest, including calculation of ankle-brachial indices with single level Doppler, pressure and pulse volume recording. COMPARISON:  None. FINDINGS: Right ABI:  0.73 Left ABI:  0.64 Right Lower Extremity: Monophasic posterior tibial and dorsalis pedis waveforms distally. Left Lower Extremity: Monophasic posterior tibial and dorsalis pedis waveforms distally. 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 Consider further evaluation with CT angiography of the abdominal aorta with bilateral iliofemoral runoff for direct imaging of the arterial vasculature. Electronically Signed   By: Aletta Edouard M.D.   On: 08/26/2020 12:03       IMPRESSION/PLAN: 1. 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 Dr. Lisbeth Renshaw discusses the pathology findings and reviews the nature of metastatic disease. He offers the patient fractionated SRS over three fractions as the patient is not interested in preoperative SRS. He is aware of the need for the 3T MRI scan this afternoon to proceed with treatment planning and to ensure he is a good candidate for targeted radiosurgery.  We discussed the risks, benefits, short, and long term effects of radiotherapy, as well as the curative intent in the brain, but is also in agreement that he understands that stage IV disease is difficult to control systemically. He will meet with Dr. Marin Olp soon to follow up on systemic options, and the patient is interested in proceeding in the meantime with SRS. He will meet with Dr. Marcello Moores as well in the near future who will be a part of the treatment team for his [facility] treatment.. Dr. Lisbeth Renshaw discusses the delivery and logistics of radiotherapy and we will see the patient on Friday for simulation. His first fraction is scheduled for next Thursday. He is also aware that additional palliative radiotherapy could be considered if his breathing worsened or if he developed hemoptysis.     Given current concerns for patient exposure during the COVID-19 pandemic, this encounter was conducted via telephone.  The patient has provided two factor identification and has given verbal consent for this type of encounter and has been advised to only accept a meeting of this type in a secure  network environment. The time spent during this encounter was 60 minutes including preparation, discussion, and coordination of the patient's care. The attendants for this meeting include Dr. Lisbeth Renshaw, Hayden Pedro  and Dory Peru and his wife and daughter. During the encounter,  Dr. Lisbeth Renshaw, was located at [facility] Radiation Oncology Department. Shona Simpson was located remotely at home. Lain Tetterton was located at home with his family.    The above documentation reflects my direct findings during this shared patient visit. Please see the separate note by Dr. Lisbeth Renshaw on this date for the remainder of the patient's plan of care.    Carola Rhine, [facility]   **Disclaimer: This note was dictated with voice recognition software. Similar sounding words can inadvertently be transcribed and this note may contain transcription errors which may not have been corrected upon publication of note.**

## 2020-09-08 NOTE — Addendum Note (Signed)
Encounter addended by: Hayden Pedro, PA-C on: 09/08/2020 1:04 PM  Actions taken: Clinical Note Signed

## 2020-09-09 ENCOUNTER — Encounter: Payer: Self-pay | Admitting: *Deleted

## 2020-09-09 LAB — ACID FAST SMEAR (AFB, MYCOBACTERIA): Acid Fast Smear: NEGATIVE

## 2020-09-09 NOTE — Progress Notes (Signed)
Per Dr Dicie Beam request, oncoType MAP testing sent on specimen  [id number] DOS 09/06/2020  Path results also routed to Worthy Flank PA-C for radiation planning.   Oncology Nurse Navigator Documentation  Oncology Nurse Navigator Flowsheets 09/09/2020  Abnormal Finding Date -  Confirmed Diagnosis Date 09/06/2020  Diagnosis Status Confirmed Diagnosis Complete  Navigator Follow Up Date: -  Navigator Follow Up Reason: -  Navigator Location [facility]  Referral Date to RadOnc/MedOnc -  Navigator Encounter Type Molecular Studies  Patient Visit Type MedOnc  Treatment Phase Pre-Tx/Tx Discussion  Barriers/Navigation Needs Coordination of Care;Education  Education -  Interventions Coordination of Care  Acuity Level 2-Minimal Needs (1-2 Barriers Identified)  Coordination of Care Other  Education Method -  Support Groups/Services Friends and Family  Time Spent with Patient 28

## 2020-09-09 NOTE — Progress Notes (Signed)
Has armband been applied?  Yes  Does patient have an allergy to IV contrast dye?:    Has patient ever received premedication for IV contrast dye?:   Does patient take metformin?: No  If patient does take metformin when was the last dose: n/a  Date of lab work: 09/06/2020 BUN: 33 CR: 0.90 eGfr: >60  IV site: Left AC  Has IV site been added to flowsheet?

## 2020-09-10 ENCOUNTER — Ambulatory Visit
Admission: RE | Admit: 2020-09-10 | Discharge: 2020-09-10 | Disposition: A | Discharge status: Home / Self Care | Payer: Medicare Other | Source: Ambulatory Visit | Attending: Radiation Oncology | Admitting: Radiation Oncology

## 2020-09-10 ENCOUNTER — Other Ambulatory Visit: Payer: Self-pay

## 2020-09-10 VITALS — BP 109/52 | HR 63 | Temp 97.0°F | Resp 18 | Ht 72.0 in | Wt 190.4 lb

## 2020-09-10 DIAGNOSIS — F17200 Nicotine dependence, unspecified, uncomplicated: Secondary | ICD-10-CM | POA: Diagnosis not present

## 2020-09-10 DIAGNOSIS — C3490 Malignant neoplasm of unspecified part of unspecified bronchus or lung: Secondary | ICD-10-CM

## 2020-09-10 DIAGNOSIS — C3411 Malignant neoplasm of upper lobe, right bronchus or lung: Secondary | ICD-10-CM | POA: Diagnosis present

## 2020-09-10 DIAGNOSIS — C7931 Secondary malignant neoplasm of brain: Secondary | ICD-10-CM | POA: Diagnosis not present

## 2020-09-10 MED ORDER — SODIUM CHLORIDE 0.9% FLUSH
10.0000 mL | Freq: Once | INTRAVENOUS | Status: AC
  Administered 2020-09-10: 10 mL via INTRAVENOUS

## 2020-09-13 ENCOUNTER — Inpatient Hospital Stay: Payer: Medicare Other | Attending: Radiation Oncology

## 2020-09-13 LAB — ANAEROBIC CULTURE W GRAM STAIN

## 2020-09-14 ENCOUNTER — Encounter: Payer: Self-pay | Admitting: *Deleted

## 2020-09-14 ENCOUNTER — Encounter: Payer: Self-pay | Admitting: Radiation Oncology

## 2020-09-14 NOTE — Progress Notes (Signed)
Called and went over pathology results per Dr Elsworth Soho with patient's wife, Cherylann Parr, per [facility]. All questions answered and wife expressed full understanding. Wife stated patient is currently on augmentin 875 for 14 days that was written on 09/06/20 by Dr Wyline Copas (per chart). Dr Elsworth Soho do you still want augmentin script sent in?  Dr Elsworth Soho please advise.

## 2020-09-14 NOTE — Progress Notes (Signed)
  Radiation Oncology         [phone]) [phone] ________________________________  Name: Bryce Powell  TDV:[id number]  Date of Service: 09/14/20  DOB: [birth year]   Steroid Taper Instructions  You are note currently taking medication to prevent stomach irritation with your steroids. Please begin taking over the counter Prilosec (Omeprazole) or Nexium (esomeprazole magnesium) once a day.  You currently have a prescription for Dexamethasone 4 mg Tablets.   Beginning 09/20/20 Take a 4 mg tablet twice a day  Beginning 09/27/20: Take 1/2 of a tablet (which is 2 mg) twice a day  Beginning 10/04/20: Take 1/2 of a tablet (which is 2 mg) once a day  Beginning 10/11/20: Take 1/2 of a tablet (which is 2 mg) every other day and stop on 10/18/20.   Please call our office if you have any headaches, visual changes, uncontrolled movements, nausea or vomiting.

## 2020-09-14 NOTE — Progress Notes (Signed)
Called patient and spoke to his wife. Patient is starting radiation this week and will need follow up with medical oncology. Spoke with the wife about transferring care to [facility] as this location is much closer to patient's home. Wife states they don't want to transfer care and wish to continue coming to [facility].  Appointment made for follow up. Wife is aware of date and time.   Patient also mentions that the patient fell this morning. She states he didn't injure himself but it was very difficult for her to handle the fall, as she didn't have any help. Encouraged her to talk with family about having someone there to help her with ADLs and ambulation with patient. Also encouraged her to call 911 if the patient fell again and she was alone and unable to help.  Oncology Nurse Navigator Documentation  Oncology Nurse Navigator Flowsheets 09/14/2020  Abnormal Finding Date -  Confirmed Diagnosis Date -  Diagnosis Status -  Phase of Treatment Radiation  Navigator Follow Up Date: 09/29/2020  Navigator Follow Up Reason: Follow-up Appointment  Navigator Location [facility]  Referral Date to RadOnc/MedOnc -  Navigator Encounter Type Telephone  Telephone Asess Navigation Needs;Outgoing Call  Patient Visit Type MedOnc  Treatment Phase Pre-Tx/Tx Discussion  Barriers/Navigation Needs Coordination of Care;Education  Education Other  Interventions Coordination of Care;Education;Psycho-Social Support  Acuity Level 2-Minimal Needs (1-2 Barriers Identified)  Coordination of Care Appts  Education Method Verbal  Support Groups/Services Friends and Family  Time Spent with Patient 30

## 2020-09-15 DIAGNOSIS — C7931 Secondary malignant neoplasm of brain: Secondary | ICD-10-CM | POA: Insufficient documentation

## 2020-09-15 DIAGNOSIS — C3490 Malignant neoplasm of unspecified part of unspecified bronchus or lung: Secondary | ICD-10-CM | POA: Diagnosis present

## 2020-09-16 ENCOUNTER — Ambulatory Visit
Admission: RE | Admit: 2020-09-16 | Discharge: 2020-09-16 | Disposition: A | Discharge status: Home / Self Care | Payer: Medicare Other | Source: Ambulatory Visit | Attending: Radiation Oncology | Admitting: Radiation Oncology

## 2020-09-16 ENCOUNTER — Other Ambulatory Visit: Payer: Self-pay

## 2020-09-16 DIAGNOSIS — C3490 Malignant neoplasm of unspecified part of unspecified bronchus or lung: Secondary | ICD-10-CM | POA: Diagnosis not present

## 2020-09-16 NOTE — Progress Notes (Addendum)
Bryce Powell rested with [name] for 30 minutes following his SRS treatment.  Patient denies headache, dizziness, nausea, diplopia or ringing in the ears. Denies fatigue. Patient without complaints. Understands to avoid strenuous activity for the next 24 hours and call [phone] with needs.  Wife and daughter with him.    BP 133/60 (BP Location: Right Arm, Patient Position: Sitting, Cuff Size: Large)   Pulse 70   Temp (!) 97.5 F (36.4 C)   Resp 20   SpO2 99%    Bryce Powell, BSN

## 2020-09-16 NOTE — Op Note (Signed)
Name: Bryce Powell    MRN: [id number]   Date: 09/16/2020    DOB: 03/13/1941   STEREOTACTIC RADIOSURGERY OPERATIVE NOTE  PRE-OPERATIVE DIAGNOSIS:  Metastatic brain disease  POST-OPERATIVE DIAGNOSIS:  Metastatic brain disease  PROCEDURE:   1. Stereotactic Radiosurgery using TrueBeam Linac device to complex lesion (3.5 cm) 2. Stereotactic Radiosurgery using TrueBeam Linac device to additional simple lesion  SURGEON:  Duffy Rhody, MD  RADIATION ONCOLOGIST: Kyung Rudd, MD  TECHNIQUE:  The patient underwent a radiation treatment planning session in the radiation oncology simulation suite under the care of the radiation oncology physician and physicist.  I participated closely in the radiation treatment planning afterwards. The patient underwent planning CT which was fused to 3T high resolution MRI with 1 mm axial slices.  These images were fused on the planning system.  We contoured the gross target volumes and subsequently expanded this to yield the Planning Target Volume for both the large left temporal lobe lesion and the smaller left occipital lobe lesion. I actively participated in the planning process.  I helped to define and review the target contours and also the contours of the optic pathway, eyes, brainstem and selected nearby organs at risk.  All the dose constraints for critical structures were reviewed and compared to AAPM Task Group 101.  The prescription dose conformity was reviewed.  I approved the plan electronically.    Accordingly, Bryce Powell  was brought to the TrueBeam stereotactic radiation treatment linac and placed in the custom immobilization mask.  The patient was aligned according to the IR fiducial markers with BrainLab Exactrac, then orthogonal x-rays were used in ExacTrac with the 6DOF robotic table and the shifts were made to align the patient  Bryce Powell received stereotactic radiosurgery to a prescription dose of 20 Gy to the occipital lesion.  He also  underwent the first of 3 fractions to the large left temporal lesion, with plan for a total of 27 Gy.  He will return for the 2nd and 3rd fractions to this lesion in the upcoming days.  The detailed description of the procedure is recorded in the radiation oncology procedure note.  I was present for the duration of the procedure.  DISPOSITION:   Following delivery, the patient was transported to nursing in stable condition and monitored for possible acute effects to be discharged to home in stable condition with follow-up in one month.  Duffy Rhody, MD [facility]

## 2020-09-16 NOTE — Progress Notes (Signed)
Patient already received augmentin script at discharge from hospital, Dr Elsworth Soho is aware. Wife of patient stated patient is taking medication. Nothing further needed at this time.

## 2020-09-17 NOTE — Progress Notes (Signed)
Called and updated wife, Lorene per DPR that new script for Augmentin not currently needed per verbal from Dr Elsworth Soho due to patient already on medication. Wife expressed full understanding. Nothing further needed at this time.

## 2020-09-20 ENCOUNTER — Other Ambulatory Visit: Payer: Self-pay

## 2020-09-20 ENCOUNTER — Ambulatory Visit
Admission: RE | Admit: 2020-09-20 | Discharge: 2020-09-20 | Disposition: A | Discharge status: Home / Self Care | Payer: Medicare Other | Source: Ambulatory Visit | Attending: Radiation Oncology | Admitting: Radiation Oncology

## 2020-09-20 VITALS — BP 119/60 | HR 67 | Temp 96.8°F | Resp 18

## 2020-09-20 DIAGNOSIS — C7931 Secondary malignant neoplasm of brain: Secondary | ICD-10-CM

## 2020-09-20 DIAGNOSIS — C3490 Malignant neoplasm of unspecified part of unspecified bronchus or lung: Secondary | ICD-10-CM | POA: Diagnosis not present

## 2020-09-20 NOTE — Progress Notes (Signed)
Mr. Gustafson rested with [name] for 15 minutes following his SRS treatment.  Patient denies headache, dizziness, nausea, diplopia or ringing in the ears. Denies fatigue. Patient without complaints. Understands to avoid strenuous activity for the next 24 hours and call [phone] with needs.  Patient taken to waiting room to meet family, no concerns voiced.    BP 119/60   Pulse 67   Temp (!) 96.8 F (36 C)   Resp 18   SpO2 99%   Keamber Macfadden M. [name], BSN

## 2020-09-22 ENCOUNTER — Encounter: Payer: Self-pay | Admitting: Radiation Oncology

## 2020-09-22 ENCOUNTER — Other Ambulatory Visit: Payer: Self-pay

## 2020-09-22 ENCOUNTER — Ambulatory Visit
Admission: RE | Admit: 2020-09-22 | Discharge: 2020-09-22 | Disposition: A | Discharge status: Home / Self Care | Payer: Medicare Other | Source: Ambulatory Visit | Attending: Radiation Oncology | Admitting: Radiation Oncology

## 2020-09-22 DIAGNOSIS — C3490 Malignant neoplasm of unspecified part of unspecified bronchus or lung: Secondary | ICD-10-CM | POA: Diagnosis not present

## 2020-09-22 NOTE — Progress Notes (Signed)
Bryce Powell rested with [name] for 15 minutes following his SRS treatment.  Patient denies headache, dizziness, nausea, diplopia or ringing in the ears. Denies fatigue. Patient without complaints. Understands to avoid strenuous activity for the next 24 hours and call [phone] with needs.  Patient taken to waiting room to meet family, no concerns voiced.    BP (!) 128/58   Pulse 63   Temp (!) 96.9 F (36.1 C)   Resp 18   SpO2 99%   Bryce Powell M. [name], BSN

## 2020-09-27 LAB — CULTURE, FUNGUS WITHOUT SMEAR

## 2020-09-28 ENCOUNTER — Other Ambulatory Visit: Payer: Self-pay | Admitting: *Deleted

## 2020-09-28 DIAGNOSIS — C3490 Malignant neoplasm of unspecified part of unspecified bronchus or lung: Secondary | ICD-10-CM

## 2020-09-28 DIAGNOSIS — C7931 Secondary malignant neoplasm of brain: Secondary | ICD-10-CM

## 2020-09-29 ENCOUNTER — Other Ambulatory Visit: Payer: Self-pay

## 2020-09-29 ENCOUNTER — Inpatient Hospital Stay: Payer: Medicare Other | Attending: Hematology & Oncology

## 2020-09-29 ENCOUNTER — Telehealth: Payer: Self-pay

## 2020-09-29 ENCOUNTER — Inpatient Hospital Stay (HOSPITAL_BASED_OUTPATIENT_CLINIC_OR_DEPARTMENT_OTHER): Payer: Medicare Other | Admitting: Hematology & Oncology

## 2020-09-29 ENCOUNTER — Encounter: Payer: Self-pay | Admitting: Hematology & Oncology

## 2020-09-29 VITALS — BP 104/53 | HR 91 | Temp 97.9°F | Resp 18 | Wt 194.0 lb

## 2020-09-29 DIAGNOSIS — C3490 Malignant neoplasm of unspecified part of unspecified bronchus or lung: Secondary | ICD-10-CM | POA: Diagnosis not present

## 2020-09-29 DIAGNOSIS — Z79899 Other long term (current) drug therapy: Secondary | ICD-10-CM | POA: Insufficient documentation

## 2020-09-29 DIAGNOSIS — C3411 Malignant neoplasm of upper lobe, right bronchus or lung: Secondary | ICD-10-CM | POA: Diagnosis present

## 2020-09-29 DIAGNOSIS — C7931 Secondary malignant neoplasm of brain: Secondary | ICD-10-CM | POA: Diagnosis not present

## 2020-09-29 DIAGNOSIS — Z5112 Encounter for antineoplastic immunotherapy: Secondary | ICD-10-CM | POA: Diagnosis present

## 2020-09-29 DIAGNOSIS — Z7189 Other specified counseling: Secondary | ICD-10-CM | POA: Diagnosis not present

## 2020-09-29 DIAGNOSIS — C779 Secondary and unspecified malignant neoplasm of lymph node, unspecified: Secondary | ICD-10-CM | POA: Diagnosis not present

## 2020-09-29 HISTORY — DX: Other specified counseling: Z71.89

## 2020-09-29 LAB — COMPREHENSIVE METABOLIC PANEL
ALT: 28 U/L (ref 0–44)
AST: 14 U/L — ABNORMAL LOW (ref 15–41)
Albumin: 3.5 g/dL (ref 3.5–5.0)
Alkaline Phosphatase: 97 U/L (ref 38–126)
Anion gap: 6 (ref 5–15)
BUN: 33 mg/dL — ABNORMAL HIGH (ref 8–23)
CO2: 30 mmol/L (ref 22–32)
Calcium: 9.2 mg/dL (ref 8.9–10.3)
Chloride: 95 mmol/L — ABNORMAL LOW (ref 98–111)
Creatinine, Ser: 1.13 mg/dL (ref 0.61–1.24)
GFR, Estimated: >60 mL/min (ref >60)
Glucose, Bld: 164 mg/dL — ABNORMAL HIGH (ref 70–99)
Potassium: 4.7 mmol/L (ref 3.5–5.1)
Sodium: 131 mmol/L — ABNORMAL LOW (ref 135–145)
Total Bilirubin: 0.4 mg/dL (ref 0.3–1.2)
Total Protein: 6.2 g/dL — ABNORMAL LOW (ref 6.5–8.1)

## 2020-09-29 LAB — CBC WITH DIFFERENTIAL (CANCER CENTER ONLY)
Abs Immature Granulocytes: 0.26 10*3/uL — ABNORMAL HIGH (ref 0.00–0.07)
Basophils Absolute: 0 10*3/uL (ref 0.0–0.1)
Basophils Relative: 0 %
Eosinophils Absolute: 0 10*3/uL (ref 0.0–0.5)
Eosinophils Relative: 0 %
HCT: 37.1 % — ABNORMAL LOW (ref 39.0–52.0)
Hemoglobin: 12.5 g/dL — ABNORMAL LOW (ref 13.0–17.0)
Immature Granulocytes: 2 %
Lymphocytes Relative: 8 %
Lymphs Abs: 1.1 10*3/uL (ref 0.7–4.0)
MCH: 28.7 pg (ref 26.0–34.0)
MCHC: 33.7 g/dL (ref 30.0–36.0)
MCV: 85.1 fL (ref 80.0–100.0)
Monocytes Absolute: 0.7 10*3/uL (ref 0.1–1.0)
Monocytes Relative: 5 %
Neutro Abs: 12.2 10*3/uL — ABNORMAL HIGH (ref 1.7–7.7)
Neutrophils Relative %: 85 %
Platelet Count: 187 10*3/uL (ref 150–400)
RBC: 4.36 MIL/uL (ref 4.22–5.81)
RDW: 18.3 % — ABNORMAL HIGH (ref 11.5–15.5)
WBC Count: 14.2 10*3/uL — ABNORMAL HIGH (ref 4.0–10.5)
nRBC: 0 % (ref 0.0–0.2)

## 2020-09-29 NOTE — Telephone Encounter (Signed)
appts made per 09/29/20 los, pt to gain sch at [facility]

## 2020-09-29 NOTE — Progress Notes (Signed)
Hematology and Oncology Follow Up Visit  Langley Ingalls [id number] 1940-10-29 80 y.o. 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 Callow is in for his first office visit.  I saw him in consultation at [facility] back in April.  At that time, he presented with some brain metastasis.  Had a large right upper lobe lesion.  This was subsequently biopsied via bronchoscopy.  The bronchoscopy was done on 09/06/2020.  The pathology report ([id number]) showed metastatic squamous cell carcinoma.  We did do molecular studies.  There is no actionable mutations.  However, the tumor had a high level of PD-L1.  He subsequently has undergone radiosurgery.  He seemed to tolerate this pretty well.  He is definitely alert.  He is quite animated.  He has had no problems with pain.  He is eating well.  I am sure the Decadron that he is on is helping with this.  He has had no issues with nausea or vomiting.  He has had no problems with cough.  There is no hemoptysis.  There is no change in bowel or bladder habits.  There is no issues with rashes.  He has had no visual difficulties.  There is no mouth sores.  Overall, I would say his performance status is ECOG 1.  Medications:  Current Outpatient Medications:  .  acetaminophen (TYLENOL) 500 MG tablet, Take 2 tablets (1,000 mg total) by mouth every 6 (six) hours. (Patient taking differently: Take 1,000 mg by mouth every 6 (six) hours as needed for moderate pain.), Disp: 30 tablet, Rfl: 0 .  allopurinol (ZYLOPRIM) 300 MG tablet, Take 1 tablet (300 mg total) by mouth daily., Disp: 30 tablet, Rfl: 5 .  amLODipine (NORVASC) 5 MG tablet, Take 5 mg by mouth daily., Disp: , Rfl:  .  dexamethasone (DECADRON) 4 MG  tablet, Take 1 tablet (4 mg total) by mouth 3 (three) times daily., Disp: 90 tablet, Rfl: 0 .  esomeprazole (NEXIUM) 20 MG capsule, Take 20 mg by mouth daily., Disp: , Rfl:  .  glipiZIDE (GLUCOTROL XL) 10 MG 24 hr tablet, Take 10 mg by mouth daily., Disp: , Rfl:  .  insulin glargine (LANTUS) 100 UNIT/ML Solostar Pen, Inject 14 Units into the skin daily., Disp: 15 mL, Rfl: 0 .  insulin lispro (HUMALOG KWIKPEN) 100 UNIT/ML KwikPen, Inject 5 Units into the skin 3 (three) times daily., Disp: 15 mL, Rfl: 0 .  levETIRAcetam (KEPPRA) 500 MG tablet, Take 1 tablet (500 mg total) by mouth 2 (two) times daily., Disp: 60 tablet, Rfl: 0 .  linaclotide (LINZESS) 145 MCG CAPS capsule, Take 145 mcg by mouth daily before breakfast., Disp: , Rfl:  .  losartan (COZAAR) 50 MG tablet, Take 50 mg daily by mouth., Disp: , Rfl:  .  metFORMIN (GLUCOPHAGE) 1000 MG tablet, Take 1 tablet by mouth 2 (two) times daily., Disp: , Rfl:  .  nitroGLYCERIN (NITROSTAT) 0.4 MG SL tablet, Place 1 tablet (0.4 mg total) under the tongue every 5 (five) minutes as needed for chest pain., Disp: 25 tablet, Rfl: 3 .  RELION PEN NEEDLE 31G/8MM 31G X 8 MM MISC, USE AS DIRECTED FIVE TIMES DAILY, Disp: , Rfl:  .  rosuvastatin (CRESTOR) 5 MG tablet, TAKE 1 TABLET  BY MOUTH  DAILY, Disp: 90 tablet, Rfl: 1  Allergies:  Allergies  Allergen Reactions  . Atorvastatin     MUSCLE ACHES     Past Medical History, Surgical history, Social history, and Family History were reviewed and updated.  Review of Systems: Review of Systems  Constitutional: Negative.   HENT:  Negative.   Eyes: Negative.   Respiratory: Negative.   Cardiovascular: Negative.   Gastrointestinal: Negative.   Endocrine: Negative.   Genitourinary: Negative.    Musculoskeletal: Negative.   Skin: Negative.   Neurological: Negative.   Hematological: Negative.   Psychiatric/Behavioral: Negative.     Physical Exam:  weight is 194 lb (88 kg). His oral temperature is 97.9 F  (36.6 C). His blood pressure is 104/53 (abnormal) and his pulse is 91. His respiration is 18 and oxygen saturation is 92%.   Wt Readings from Last 3 Encounters:  09/29/20 194 lb (88 kg)  09/10/20 190 lb 6 oz (86.4 kg)  09/06/20 197 lb 8.5 oz (89.6 kg)    Physical Exam Vitals reviewed.  HENT:     Head: Normocephalic and atraumatic.  Eyes:     Pupils: Pupils are equal, round, and reactive to light.  Cardiovascular:     Rate and Rhythm: Normal rate and regular rhythm.     Heart sounds: Normal heart sounds.  Pulmonary:     Effort: Pulmonary effort is normal.     Breath sounds: Normal breath sounds.  Abdominal:     General: Bowel sounds are normal.     Palpations: Abdomen is soft.  Musculoskeletal:        General: No tenderness or deformity. Normal range of motion.     Cervical back: Normal range of motion.  Lymphadenopathy:     Cervical: No cervical adenopathy.  Skin:    General: Skin is warm and dry.     Findings: No erythema or rash.  Neurological:     Mental Status: He is alert and oriented to person, place, and time.  Psychiatric:        Behavior: Behavior normal.        Thought Content: Thought content normal.        Judgment: Judgment normal.     Lab Results  Component Value Date   WBC 14.2 (H) 09/29/2020   HGB 12.5 (L) 09/29/2020   HCT 37.1 (L) 09/29/2020   MCV 85.1 09/29/2020   PLT 187 09/29/2020     Chemistry      Component Value Date/Time   NA 131 (L) 09/29/2020 1252   NA 139 04/27/2017 0857   K 4.7 09/29/2020 1252   CL 95 (L) 09/29/2020 1252   CO2 30 09/29/2020 1252   BUN 33 (H) 09/29/2020 1252   BUN 24 04/27/2017 0857   CREATININE 1.13 09/29/2020 1252   CREATININE 0.92 09/03/2020 1018      Component Value Date/Time   CALCIUM 9.2 09/29/2020 1252   ALKPHOS 97 09/29/2020 1252   AST 14 (L) 09/29/2020 1252   AST 12 (L) 09/03/2020 1018   ALT 28 09/29/2020 1252   ALT 15 09/03/2020 1018   BILITOT 0.4 09/29/2020 1252   BILITOT 0.4 09/03/2020 1018       Impression and Plan: Mr. Gutridge is a very nice 80 year old white male.  He has metastatic bronchogenic carcinoma.  This is a squamous cell cancer.  He underwent radiosurgery for the CNS metastasis.  We now we will see about trying to treat him systemically.  He seems to  be in good shape.  He has a decent performance status.  He is eating well.  He has great support from his family.  I think that immunotherapy would not be a bad idea for him.  He has a very high TMB level.  I think this would be reasonable.  It would not mean a chemotherapy.  You will need to have a Port-A-Cath placed.  We will have to see about getting this in for him.  I talked to he and his family about immunotherapy.  I think this would be tolerable.  I think that he might have a wire child for diarrhea.  We talked about this.  I told them that if he starts having diarrhea to let us know immediately.  We also talked about hypothyroidism.  We will check his thyroid levels.  Again, I think this would be a reasonable choice with using immunotherapy given his age and overall performance status.  We will see about trying to started in a week.  I will give him 4 cycles of combination immunotherapy and then we will repeat his CT scan.  At some point, we also have to monitor his CNS disease.  I would like to see him back when we start his second cycle of immunotherapy.   Volanda Napoleon, MD 5/18/20222:20 PM

## 2020-09-29 NOTE — Progress Notes (Signed)
START OFF PATHWAY REGIMEN - Non-Small Cell Lung   [id number]:Ipilimumab 1 mg/kg + Nivolumab 3 mg/kg q21 Days x 4 Cycles (Induction):   A cycle is every 21 days:     Nivolumab      Ipilimumab   **Always confirm dose/schedule in your pharmacy ordering system**  Patient Characteristics: Stage IV Metastatic, Squamous, Molecular Analysis Completed, Alteration Present and Targeted Therapy Exhausted or EGFR Exon 20 Insertion or KRAS G12C Present, and No Prior Chemo/Immunotherapy or No Alteration Present, PS = 2, Initial  Chemotherapy/Immunotherapy, PD-L1 Expression Positive ? 50% (TPS) Therapeutic Status: Stage IV Metastatic Histology: Squamous Cell Molecular Analysis Results: No Alteration Present ECOG Performance Status: 2 Chemotherapy/Immunotherapy Line of Therapy: Initial Chemotherapy/Immunotherapy PD-L1 Expression Status: PD-L1 Positive ? 50% (TPS) Intent of Therapy: Non-Curative / Palliative Intent, Discussed with Patient

## 2020-09-30 ENCOUNTER — Other Ambulatory Visit: Payer: Self-pay | Admitting: *Deleted

## 2020-09-30 ENCOUNTER — Other Ambulatory Visit: Payer: Self-pay | Admitting: Radiology

## 2020-09-30 ENCOUNTER — Encounter: Payer: Self-pay | Admitting: *Deleted

## 2020-09-30 DIAGNOSIS — C3490 Malignant neoplasm of unspecified part of unspecified bronchus or lung: Secondary | ICD-10-CM

## 2020-09-30 DIAGNOSIS — C7931 Secondary malignant neoplasm of brain: Secondary | ICD-10-CM

## 2020-09-30 NOTE — Progress Notes (Signed)
  Patient Name: Bryce Powell MRN: [id number] DOB: [birth year] Referring Physician: [facility] Date of Service: 09/22/2020 [facility],                                                         End Of Treatment Note  Diagnoses: C79.31-Secondary malignant neoplasm of brain  Cancer Staging:  Stage IV, NSCLC, squamous cell carcinoma of the RUL with brain metastases  Intent: Palliative  Radiation Treatment Dates: 09/16/2020 through 09/22/2020 Site Technique Total Dose (Gy) Dose per Fx (Gy) Completed Fx Beam Energies  Brain: Brain [id number]  IMRT 27/27 9 3/3 6XFFF  Brain: [id number]  IMRT 9/9 9 1/1 6XFFF   Narrative: The patient tolerated radiation therapy relatively well. He did have taper instructions at the conclusion of treatment.   Plan: The patient will receive a call in about one month from the radiation oncology department. He will continue follow up with Dr. Marin Olp as well.   ________________________________________________    Carola Rhine, [facility]

## 2020-09-30 NOTE — Progress Notes (Signed)
Patient was seen yesterday to discuss treatment. Plan is to begin treatment next week. He will need a port and chemo education with the plan to start treatment on 10/08/2020.  Scheduled for port on 10/04/2020. Patient lives a significant distance from our office, so we will arrange for chemo education over the phone, with in person reinforcement on the day of treatment initiation. Chemo Ed scheduled for 10/05/2020 via telephone.   Spoke to patient's wife. Reviewed the port appointment including date, time and location. Educated to the prep including NPO after 8a and the need for a driver.     Oncology Nurse Navigator Documentation  Oncology Nurse Navigator Flowsheets 09/30/2020  Abnormal Finding Date -  Confirmed Diagnosis Date -  Diagnosis Status -  Phase of Treatment Radiation  Radiation Actual Start Date: 09/16/2020  Radiation Actual End Date: 09/22/2020  Navigator Follow Up Date: 10/08/2020  Navigator Follow Up Reason: Chemotherapy  Navigator Location [facility]  Referral Date to RadOnc/MedOnc -  Navigator Encounter Type Appt/Treatment Plan Review;Telephone  Telephone Appt Confirmation/Clarification;Outgoing Call  Treatment Initiated Date 09/16/2020  Patient Visit Type MedOnc  Treatment Phase Active Tx  Barriers/Navigation Needs Coordination of Care;Education  Education Other  Interventions Coordination of Care;Education;Psycho-Social Support  Acuity Level 2-Minimal Needs (1-2 Barriers Identified)  Coordination of Care Appts;Radiology  Education Method Verbal  Support Groups/Services Friends and Family  Time Spent with Patient 56

## 2020-10-01 NOTE — Progress Notes (Signed)
Pharmacist Chemotherapy Monitoring - Initial Assessment    Anticipated start date: 10/08/20   Regimen:  . Are orders appropriate based on the patient's diagnosis, regimen, and cycle? Yes . Does the plan date match the patient's scheduled date? Yes . Is the sequencing of drugs appropriate? Yes . Are the premedications appropriate for the patient's regimen? Yes . Prior Authorization for treatment is: Approved o If applicable, is the correct biosimilar selected based on the patient's insurance? not applicable  Organ Function and Labs: Marland Kitchen Are dose adjustments needed based on the patient's renal function, hepatic function, or hematologic function? No . Are appropriate labs ordered prior to the start of patient's treatment? Yes . Other organ system assessment, if indicated: N/A . The following baseline labs, if indicated, have been ordered: ipilimumab: baseline TSH +/- T4  Dose Assessment: . Are the drug doses appropriate? Yes . Are the following correct: o Drug concentrations Yes o IV fluid compatible with drug Yes o Administration routes Yes o Timing of therapy Yes . If applicable, does the patient have documented access for treatment and/or plans for port-a-cath placement? yes . If applicable, have lifetime cumulative doses been properly documented and assessed? not applicable Lifetime Dose Tracking  No doses have been documented on this patient for the following tracked chemicals: Doxorubicin, Epirubicin, Idarubicin, Daunorubicin, Mitoxantrone, Bleomycin, Oxaliplatin, Carboplatin, Liposomal Doxorubicin  o   Toxicity Monitoring/Prevention: . The patient has the following take home antiemetics prescribed: N/A . The patient has the following take home medications prescribed: N/A . Medication allergies and previous infusion related reactions, if applicable, have been reviewed and addressed. Yes . The patient's current medication list has been assessed for drug-drug interactions with their  chemotherapy regimen. no significant drug-drug interactions were identified on review.  Order Review: . Are the treatment plan orders signed? Yes . Is the patient scheduled to see a provider prior to their treatment? Yes  I verify that I have reviewed each item in the above checklist and answered each question accordingly.  Romualdo Bolk [place], [place], 10/01/2020  1:26 PM

## 2020-10-04 ENCOUNTER — Ambulatory Visit (HOSPITAL_COMMUNITY)
Admission: RE | Admit: 2020-10-04 | Discharge: 2020-10-04 | Disposition: A | Discharge status: Home / Self Care | Payer: Medicare Other | Source: Ambulatory Visit | Attending: Hematology & Oncology | Admitting: Hematology & Oncology

## 2020-10-04 ENCOUNTER — Other Ambulatory Visit: Payer: Self-pay | Admitting: Hematology & Oncology

## 2020-10-04 ENCOUNTER — Encounter (HOSPITAL_COMMUNITY): Payer: Self-pay

## 2020-10-04 ENCOUNTER — Other Ambulatory Visit: Payer: Self-pay

## 2020-10-04 DIAGNOSIS — Z79899 Other long term (current) drug therapy: Secondary | ICD-10-CM | POA: Insufficient documentation

## 2020-10-04 DIAGNOSIS — E119 Type 2 diabetes mellitus without complications: Secondary | ICD-10-CM | POA: Insufficient documentation

## 2020-10-04 DIAGNOSIS — C3490 Malignant neoplasm of unspecified part of unspecified bronchus or lung: Secondary | ICD-10-CM | POA: Diagnosis present

## 2020-10-04 DIAGNOSIS — Z7984 Long term (current) use of oral hypoglycemic drugs: Secondary | ICD-10-CM | POA: Insufficient documentation

## 2020-10-04 DIAGNOSIS — Z794 Long term (current) use of insulin: Secondary | ICD-10-CM | POA: Diagnosis not present

## 2020-10-04 DIAGNOSIS — Z951 Presence of aortocoronary bypass graft: Secondary | ICD-10-CM | POA: Diagnosis not present

## 2020-10-04 DIAGNOSIS — G4730 Sleep apnea, unspecified: Secondary | ICD-10-CM | POA: Insufficient documentation

## 2020-10-04 DIAGNOSIS — I10 Essential (primary) hypertension: Secondary | ICD-10-CM | POA: Diagnosis not present

## 2020-10-04 DIAGNOSIS — Z8249 Family history of ischemic heart disease and other diseases of the circulatory system: Secondary | ICD-10-CM | POA: Insufficient documentation

## 2020-10-04 DIAGNOSIS — I480 Paroxysmal atrial fibrillation: Secondary | ICD-10-CM | POA: Insufficient documentation

## 2020-10-04 DIAGNOSIS — C7931 Secondary malignant neoplasm of brain: Secondary | ICD-10-CM

## 2020-10-04 DIAGNOSIS — I4510 Unspecified right bundle-branch block: Secondary | ICD-10-CM | POA: Insufficient documentation

## 2020-10-04 DIAGNOSIS — F17210 Nicotine dependence, cigarettes, uncomplicated: Secondary | ICD-10-CM | POA: Insufficient documentation

## 2020-10-04 DIAGNOSIS — Z833 Family history of diabetes mellitus: Secondary | ICD-10-CM | POA: Diagnosis not present

## 2020-10-04 DIAGNOSIS — Z888 Allergy status to other drugs, medicaments and biological substances status: Secondary | ICD-10-CM | POA: Insufficient documentation

## 2020-10-04 HISTORY — PX: IR IMAGING GUIDED PORT INSERTION: IMG5740

## 2020-10-04 LAB — CBC WITH DIFFERENTIAL/PLATELET
Abs Immature Granulocytes: 0.18 10*3/uL — ABNORMAL HIGH (ref 0.00–0.07)
Basophils Absolute: 0 10*3/uL (ref 0.0–0.1)
Basophils Relative: 0 %
Eosinophils Absolute: 0 10*3/uL (ref 0.0–0.5)
Eosinophils Relative: 0 %
HCT: 36.4 % — ABNORMAL LOW (ref 39.0–52.0)
Hemoglobin: 12 g/dL — ABNORMAL LOW (ref 13.0–17.0)
Immature Granulocytes: 2 %
Lymphocytes Relative: 12 %
Lymphs Abs: 1.3 10*3/uL (ref 0.7–4.0)
MCH: 29.1 pg (ref 26.0–34.0)
MCHC: 33 g/dL (ref 30.0–36.0)
MCV: 88.1 fL (ref 80.0–100.0)
Monocytes Absolute: 0.6 10*3/uL (ref 0.1–1.0)
Monocytes Relative: 6 %
Neutro Abs: 8.6 10*3/uL — ABNORMAL HIGH (ref 1.7–7.7)
Neutrophils Relative %: 80 %
Platelets: 184 10*3/uL (ref 150–400)
RBC: 4.13 MIL/uL — ABNORMAL LOW (ref 4.22–5.81)
RDW: 19.4 % — ABNORMAL HIGH (ref 11.5–15.5)
WBC: 10.8 10*3/uL — ABNORMAL HIGH (ref 4.0–10.5)
nRBC: 0 % (ref 0.0–0.2)

## 2020-10-04 LAB — GLUCOSE, CAPILLARY: Glucose-Capillary: 169 mg/dL — ABNORMAL HIGH (ref 70–99)

## 2020-10-04 MED ORDER — FENTANYL CITRATE (PF) 100 MCG/2ML IJ SOLN
INTRAMUSCULAR | Status: AC
  Filled 2020-10-04: qty 2

## 2020-10-04 MED ORDER — HEPARIN SOD (PORK) LOCK FLUSH 100 UNIT/ML IV SOLN
INTRAVENOUS | Status: AC | PRN
  Administered 2020-10-04: 500 [IU] via INTRAVENOUS

## 2020-10-04 MED ORDER — FENTANYL CITRATE (PF) 100 MCG/2ML IJ SOLN
INTRAMUSCULAR | Status: AC | PRN
  Administered 2020-10-04: 25 ug via INTRAVENOUS
  Administered 2020-10-04: 50 ug via INTRAVENOUS

## 2020-10-04 MED ORDER — SODIUM CHLORIDE 0.9 % IV SOLN: INTRAVENOUS | Status: DC

## 2020-10-04 MED ORDER — MIDAZOLAM HCL 2 MG/2ML IJ SOLN
INTRAMUSCULAR | Status: AC
  Filled 2020-10-04: qty 2

## 2020-10-04 MED ORDER — LIDOCAINE-EPINEPHRINE 1 %-1:100000 IJ SOLN
INTRAMUSCULAR | Status: AC | PRN
Start: 2020-10-04 — End: 2020-10-04
  Administered 2020-10-04 (×2): 10 mL via INTRADERMAL

## 2020-10-04 MED ORDER — HEPARIN SOD (PORK) LOCK FLUSH 100 UNIT/ML IV SOLN
INTRAVENOUS | Status: AC
  Filled 2020-10-04: qty 5

## 2020-10-04 MED ORDER — LIDOCAINE-EPINEPHRINE 1 %-1:100000 IJ SOLN
INTRAMUSCULAR | Status: AC
  Filled 2020-10-04: qty 1

## 2020-10-04 MED ORDER — MIDAZOLAM HCL 2 MG/2ML IJ SOLN
INTRAMUSCULAR | Status: AC | PRN
  Administered 2020-10-04: 1 mg via INTRAVENOUS
  Administered 2020-10-04: 0.5 mg via INTRAVENOUS

## 2020-10-04 NOTE — Consult Note (Signed)
Chief Complaint: Patient was seen in consultation today for Port-A-Cath placement  Referring Physician(s): Ennever,Peter R  Supervising Physician: Daryll Brod  Patient Status: [place]  History of Present Illness: Bryce Powell is a 80 y.o. 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  He now presents for Port-A-Cath placement for additional treatment/immunotherapy.  Past Medical History:  Diagnosis Date  . Current smoker   . Diabetes ([place])   . Goals of care, counseling/discussion 09/29/2020  . HTN (hypertension)   . PAF (paroxysmal atrial fibrillation) ([place])    post CABG  . 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 Sleep apnea   . Tobacco abuse     Past Surgical History:  Procedure Laterality Date  . BRONCHIAL BIOPSY  09/06/2020   Procedure: BRONCHIAL BIOPSIES;  Surgeon: Rigoberto Noel, MD;  Location: [facility];  Service: Cardiopulmonary;;  . BRONCHIAL BRUSHINGS  09/06/2020   Procedure: BRONCHIAL BRUSHINGS;  Surgeon: Rigoberto Noel, MD;  Location: [facility];  Service: Cardiopulmonary;;  . BRONCHIAL NEEDLE ASPIRATION BIOPSY  09/06/2020   Procedure: BRONCHIAL NEEDLE ASPIRATION BIOPSIES;  Surgeon: Rigoberto Noel, MD;  Location: [facility];  Service: Cardiopulmonary;;  . BRONCHIAL WASHINGS  09/06/2020   Procedure: BRONCHIAL WASHINGS;  Surgeon: Rigoberto Noel, MD;  Location: [facility];  Service: Cardiopulmonary;;  BAL  . CORONARY ARTERY BYPASS GRAFT N/A 03/30/2017   Procedure: CORONARY ARTERY BYPASS GRAFTING (CABG) x Three, using left internal mammary artery and right leg greater saphenous vein harvested endoscopically;  Surgeon: Rexene Alberts, MD;  Location: [facility];  Service: Open Heart Surgery;  Laterality: N/A;  . ENDOBRONCHIAL ULTRASOUND Bilateral 09/06/2020   Procedure: ENDOBRONCHIAL ULTRASOUND;  Surgeon: Rigoberto Noel, MD;  Location: [facility];  Service: Cardiopulmonary;  Laterality: Bilateral;  . LEFT HEART CATH AND CORONARY ANGIOGRAPHY N/A 03/29/2017   Procedure: LEFT HEART CATH AND CORONARY ANGIOGRAPHY;  Surgeon: Lorretta Harp, MD;  Location: [facility];  Service: Cardiovascular;  Laterality: N/A;  . TEE WITHOUT CARDIOVERSION N/A 03/30/2017   Procedure: TRANSESOPHAGEAL ECHOCARDIOGRAM (TEE);  Surgeon: Rexene Alberts, MD;  Location: [facility];  Service: Open Heart Surgery;  Laterality: N/A;  . VIDEO BRONCHOSCOPY N/A 09/06/2020   Procedure: VIDEO BRONCHOSCOPY WITHOUT FLUORO;  Surgeon: Rigoberto Noel, MD;  Location: [facility];  Service: Cardiopulmonary;  Laterality: N/A;    Allergies: Atorvastatin  Medications: Prior to Admission medications   Medication Sig Start Date End Date Taking? Authorizing Provider  acetaminophen (TYLENOL) 500 MG tablet Take 2 tablets (1,000 mg total) by mouth every 6 (six) hours. Patient taking differently: Take 1,000 mg by mouth every 6 (six) hours as needed for moderate pain. 04/04/17   Elgie Collard, PA-C  allopurinol (ZYLOPRIM) 300 MG tablet Take 1 tablet (300 mg total) by mouth daily. 08/03/20   Sanjuana Kava, MD  amLODipine (NORVASC) 5 MG tablet Take 5 mg by mouth daily. 07/21/19   [provider]  dexamethasone (DECADRON) 4 MG tablet Take 1 tablet (4 mg total) by mouth 3 (three) times daily. 09/06/20 10/06/20  Donne Hazel, MD  esomeprazole (NEXIUM) 20 MG capsule Take 20 mg by mouth daily. 09/27/20   [provider]  glipiZIDE (GLUCOTROL XL) 10 MG 24 hr tablet Take 10 mg by mouth daily. 09/08/20   [provider]  insulin glargine (LANTUS) 100 UNIT/ML Solostar Pen Inject 14 Units into the skin daily. 09/06/20   Donne Hazel, MD  insulin lispro (HUMALOG KWIKPEN) 100  UNIT/ML KwikPen Inject 5 Units into the skin 3 (three) times daily. 09/06/20   Donne Hazel, MD  levETIRAcetam (KEPPRA) 500 MG tablet Take 1 tablet (500 mg total) by mouth 2 (two) times daily. 09/06/20 10/06/20  Donne Hazel, MD  linaclotide [facility]) 145 MCG CAPS capsule Take 145 mcg by mouth daily before breakfast.    [provider]  losartan (COZAAR) 50 MG tablet Take 50 mg daily by mouth.    [provider]  metFORMIN (GLUCOPHAGE) 1000 MG tablet Take 1 tablet by mouth 2 (two) times daily. 09/08/20   [provider]  nitroGLYCERIN (NITROSTAT) 0.4 MG SL tablet Place 1 tablet (0.4 mg total) under the tongue every 5 (five) minutes as needed for chest pain. 06/12/17 09/11/19  Herminio Commons, MD  RELION PEN NEEDLE 31G/8MM 31G X 8 MM MISC USE AS DIRECTED FIVE TIMES DAILY 09/27/20   [provider]  rosuvastatin (CRESTOR) 5 MG tablet TAKE 1 TABLET BY MOUTH  DAILY 05/27/18   Herminio Commons, MD     Family History  Problem Relation Age of Onset  . Diabetes Mother   . Heart attack Mother   . Heart attack Father     Social History   Socioeconomic History  . Marital status: Married    Spouse name: Engineer, civil (consulting)  . Number of children: 3  . Years of education: 10  . Highest education level: 10th grade  Occupational History  . Not on file  Tobacco Use  . Smoking status: Current Some Day Smoker    Packs/day: 2.00    Years: 60.00    Pack years: 120.00    Types: Cigars    Last attempt to quit: 03/28/2017    Years since quitting: 3.5  . Smokeless tobacco: Never Used  . Tobacco comment: Pt states , "I will probably quit now."  Vaping Use  . Vaping Use: Never used  Substance and Sexual Activity  . Alcohol use: No  . Drug use: No  . Sexual activity: Yes  Other Topics Concern  . Not on file  Social History Narrative  . Not on file   Social Determinants of Health   Financial Resource Strain: Not on file  Food Insecurity: Not on file   Transportation Needs: Not on file  Physical Activity: Not on file  Stress: Not on file  Social Connections: Not on file      Review of Systems currently denies fever, headache, chest pain, dyspnea, cough, abdominal/back pain, nausea, vomiting or bleeding.  He does have some  peripheral neuropathy.  Vital Signs: Vitals:   10/04/20 1252  BP: (!) 170/83  Pulse: 88  Resp: 15  Temp: 97.9 F (36.6 C)  SpO2: 98%      Physical Exam awake, alert.  Chest clear to auscultation bilaterally.  Heart with normal rate, occasional ectopy noted.  Abdomen soft, positive bowel sounds, nontender.  No significant lower extremity edema.  Imaging: MR Brain W Wo Contrast  Result Date: 09/08/2020 CLINICAL DATA:  Metastatic disease to brain. Stereotactic radiosurgery treatment planning. EXAM: MRI HEAD WITHOUT AND WITH CONTRAST TECHNIQUE: Multiplanar, multiecho pulse sequences of the brain and surrounding structures were obtained without and  with intravenous contrast. CONTRAST:  48m MULTIHANCE GADOBENATE DIMEGLUMINE 529 MG/ML IV SOLN COMPARISON:  MRI head 09/04/2020 FINDINGS: Brain: 2 enhancing mass lesions are present in the brain consistent with metastatic disease. The larger lesion in the left posterior temporal lobe measures 31 x 28 mm with moderate to extensive surrounding edema. No change from the prior study. Second lesion in the left occipital lobe measures 13 mm with mild surrounding edema. No third lesion identified 6 mm midline shift to the right is unchanged. No hydrocephalus. Chronic infarct right cerebellum. Negative for acute infarct. Scattered small white matter hyperintensities compatible with chronic microvascular ischemia. Vascular: Normal arterial flow voids Skull and upper cervical spine: No focal skeletal abnormality. Sinuses/Orbits: Paranasal sinuses clear. Bilateral cataract extraction. Scleral banding on the right. Other: None IMPRESSION: Two metastatic deposits unchanged from the recent  MRI. Lesions left posterior temporal lobe and left occipital lobe. There is moderate to extensive white matter edema with mass-effect and 6 mm midline shift to the right. Electronically Signed   By: CFranchot GalloM.D.   On: 09/08/2020 15:50    Labs:  CBC: Recent Labs    09/03/20 1018 09/04/20 0544 09/06/20 0408 09/29/20 1252  WBC 16.4* 10.9* 13.2* 14.2*  HGB 11.2* 11.7* 10.2* 12.5*  HCT 34.4* 37.1* 31.6* 37.1*  PLT 321 310 327 187    COAGS: Recent Labs    09/05/20 0556  INR 1.0    BMP: Recent Labs    09/03/20 1018 09/04/20 0544 09/05/20 1343 09/06/20 0408 09/29/20 1252  NA 135 136  --  135 131*  K 4.4 4.5  --  4.2 4.7  CL 97* 101  --  99 95*  CO2 30 25  --  27 30  GLUCOSE 177* 312* 460* 316* 164*  BUN 20 22  --  33* 33*  CALCIUM 10.1 9.5  --  9.4 9.2  CREATININE 0.92 0.81  --  0.90 1.13  GFRNONAA >60 >60  --  >60 >60    LIVER FUNCTION TESTS: Recent Labs    09/03/20 1018 09/06/20 0408 09/29/20 1252  BILITOT 0.4 0.3 0.4  AST 12* 21 14*  ALT _0 ALKPHOS 69 71 97  PROT 7.5 6.8 6.2*  ALBUMIN 3.8 2.9* 3.5    TUMOR MARKERS: No results for input(s): AFPTM, CEA, CA199, CHROMGRNA in the last 8760 hours.  Assessment and Plan: 80y.o. 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  He now presents for Port-A-Cath placement for additional treatment/immunotherapy.Risks and benefits of image guided port-a-catheter placement was discussed with the patient including, but not limited to bleeding, infection, pneumothorax, or fibrin sheath development and need for additional procedures.  All of the patient's questions were answered, patient is agreeable to proceed. Consent signed and in chart.      Thank you for this interesting consult.  I  greatly enjoyed meeting Bryce Kearseand look forward to participating in their care.  A copy of this report was sent to the requesting provider on this date.  Electronically Signed: D. KRowe Robert PA-C 10/04/2020, 12:50 PM   I spent a total of    25 minutes in face to face in clinical consultation, greater than 50% of which was counseling/coordinating care for Port-A-Cath placement

## 2020-10-04 NOTE — Procedures (Signed)
Interventional Radiology Procedure Note  Procedure: RT IJ POWER PORT    Complications: None  Estimated Blood Loss:  MIN  Findings: TIP SVCRA    M. TREVOR Pernella Ackerley, MD    

## 2020-10-04 NOTE — Discharge Instructions (Signed)
Moderate Conscious Sedation, Adult, Care After This sheet gives you information about how to care for yourself after your procedure. Your health care provider may also give you more specific instructions. If you have problems or questions, contact your health care provider. What can I expect after the procedure? After the procedure, it is common to have:  Sleepiness for several hours.  Impaired judgment for several hours.  Difficulty with balance.  Vomiting if you eat too soon. Follow these instructions at home: For the time period you were told by your health care provider:  Rest.  Do not participate in activities where you could fall or become injured.  Do not drive or use machinery.  Do not drink alcohol.  Do not take sleeping pills or medicines that cause drowsiness.  Do not make important decisions or sign legal documents.  Do not take care of children on your own.      Eating and drinking  Follow the diet recommended by your health care provider.  Drink enough fluid to keep your urine pale yellow.  If you vomit: ? Drink water, juice, or soup when you can drink without vomiting. ? Make sure you have little or no nausea before eating solid foods.   General instructions  Take over-the-counter and prescription medicines only as told by your health care provider.  Have a responsible adult stay with you for the time you are told. It is important to have someone help care for you until you are awake and alert.  Do not smoke.  Keep all follow-up visits as told by your health care provider. This is important. Contact a health care provider if:  You are still sleepy or having trouble with balance after 24 hours.  You feel light-headed.  You keep feeling nauseous or you keep vomiting.  You develop a rash.  You have a fever.  You have redness or swelling around the IV site. Get help right away if:  You have trouble breathing.  You have new-onset confusion at  home. Summary  After the procedure, it is common to feel sleepy, have impaired judgment, or feel nauseous if you eat too soon.  Rest after you get home. Know the things you should not do after the procedure.  Follow the diet recommended by your health care provider and drink enough fluid to keep your urine pale yellow.  Get help right away if you have trouble breathing or new-onset confusion at home. This information is not intended to replace advice given to you by your health care provider. Make sure you discuss any questions you have with your health care provider. Document Revised: 08/29/2019 Document Reviewed: 03/27/2019 Elsevier Patient Education  2021 [place] An implanted port is a device that is placed under the skin. It is usually placed in the chest. The device can be used to give IV medicine, to take blood, or for dialysis. You may have an implanted port if:  You need IV medicine that would be irritating to the small veins in your hands or arms.  You need IV medicines, such as antibiotics, for a long period of time.  You need IV nutrition for a long period of time.  You need dialysis. When you have a port, your health care provider can choose to use the port instead of veins in your arms for these procedures. You may have fewer limitations when using a port than you would if you used other types of long-term IVs, and  you will likely be able to return to normal activities after your incision heals. An implanted port has two main parts:  Reservoir. The reservoir is the part where a needle is inserted to give medicines or draw blood. The reservoir is round. After it is placed, it appears as a small, raised area under your skin.  Catheter. The catheter is a thin, flexible tube that connects the reservoir to a vein. Medicine that is inserted into the reservoir goes into the catheter and then into the vein. How is my port accessed? To access your  port:  A numbing cream may be placed on the skin over the port site.  Your health care provider will put on a mask and sterile gloves.  The skin over your port will be cleaned carefully with a germ-killing soap and allowed to dry.  Your health care provider will gently pinch the port and insert a needle into it.  Your health care provider will check for a blood return to make sure the port is in the vein and is not clogged.  If your port needs to remain accessed to get medicine continuously (constant infusion), your health care provider will place a clear bandage (dressing) over the needle site. The dressing and needle will need to be changed every week, or as told by your health care provider. What is flushing? Flushing helps keep the port from getting clogged. Follow instructions from your health care provider about how and when to flush the port. Ports are usually flushed with saline solution or a medicine called heparin. The need for flushing will depend on how the port is used:  If the port is only used from time to time to give medicines or draw blood, the port may need to be flushed: ? Before and after medicines have been given. ? Before and after blood has been drawn. ? As part of routine maintenance. Flushing may be recommended every 4-6 weeks.  If a constant infusion is running, the port may not need to be flushed.  Throw away any syringes in a disposal container that is meant for sharp items (sharps container). You can buy a sharps container from a pharmacy, or you can make one by using an empty hard plastic bottle with a cover. How long will my port stay implanted? The port can stay in for as long as your health care provider thinks it is needed. When it is time for the port to come out, a surgery will be done to remove it. The surgery will be similar to the procedure that was done to put the port in. Follow these instructions at home:  Flush your port as told by your health  care provider.  If you need an infusion over several days, follow instructions from your health care provider about how to take care of your port site. Make sure you: ? Wash your hands with soap and water before you change your dressing. If soap and water are not available, use alcohol-based hand sanitizer. ? Change your dressing as told by your health care provider. ? Place any used dressings or infusion bags into a plastic bag. Throw that bag in the trash. ? Keep the dressing that covers the needle clean and dry. Do not get it wet. ? Do not use scissors or sharp objects near the tube. ? Keep the tube clamped, unless it is being used.  Check your port site every day for signs of infection. Check for: ? Redness, swelling, or  pain. ? Fluid or blood. ? Pus or a bad smell.  Protect the skin around the port site. ? Avoid wearing bra straps that rub or irritate the site. ? Protect the skin around your port from seat belts. Place a soft pad over your chest if needed.  Bathe or shower as told by your health care provider. The site may get wet as long as you are not actively receiving an infusion.  Return to your normal activities as told by your health care provider. Ask your health care provider what activities are safe for you.  Carry a medical alert card or wear a medical alert bracelet at all times. This will let health care providers know that you have an implanted port in case of an emergency.   Get help right away if:  You have redness, swelling, or pain at the port site.  You have fluid or blood coming from your port site.  You have pus or a bad smell coming from the port site.  You have a fever. Summary  Implanted ports are usually placed in the chest for long-term IV access.  Follow instructions from your health care provider about flushing the port and changing bandages (dressings).  Take care of the area around your port by avoiding clothing that puts pressure on the area,  and by watching for signs of infection.  Protect the skin around your port from seat belts. Place a soft pad over your chest if needed.  Get help right away if you have a fever or you have redness, swelling, pain, drainage, or a bad smell at the port site. This information is not intended to replace advice given to you by your health care provider. Make sure you discuss any questions you have with your health care provider. Document Revised: 09/15/2019 Document Reviewed: 09/15/2019 Elsevier Patient Education  [place].

## 2020-10-05 ENCOUNTER — Other Ambulatory Visit: Payer: Medicare Other

## 2020-10-05 ENCOUNTER — Other Ambulatory Visit: Payer: Self-pay | Admitting: *Deleted

## 2020-10-05 ENCOUNTER — Encounter: Payer: Self-pay | Admitting: *Deleted

## 2020-10-05 NOTE — Progress Notes (Signed)
Chemotherapy education class conducted over the phone with  Patient wife Lorene.  Patient was sleeping.  Discussed side effects of Immunotherapy (Opdivo and Yervoy)    which include but are not limited to  fatigue, fever, allergic or infusional reaction, , cough, SOB, , nausea and vomiting, diarrhea, elevated LFTs myalgia and arthralgias, , rash, , peripheral neuropathy,  , mental changes (Chemo brain), increased risk of infections, weight loss.  Reviewed infusion room and office policy and procedure and phone numbers 24 hours x 7 days a week.  Reviewed when to call the office with any concerns or problems.  Scientist, clinical (histocompatibility and immunogenetics) given.  Discussed portacath insertion and EMLA cream administration.  Antiemetic protocol and chemotherapy schedule reviewed. Patient verbalized understanding of chemotherapy indications and possible side effects.  Teachback done

## 2020-10-08 ENCOUNTER — Inpatient Hospital Stay: Payer: Medicare Other

## 2020-10-08 ENCOUNTER — Other Ambulatory Visit: Payer: Self-pay

## 2020-10-08 ENCOUNTER — Encounter: Payer: Self-pay | Admitting: *Deleted

## 2020-10-08 VITALS — BP 135/48 | HR 71 | Temp 97.8°F | Resp 17

## 2020-10-08 DIAGNOSIS — C7931 Secondary malignant neoplasm of brain: Secondary | ICD-10-CM

## 2020-10-08 DIAGNOSIS — C3490 Malignant neoplasm of unspecified part of unspecified bronchus or lung: Secondary | ICD-10-CM

## 2020-10-08 DIAGNOSIS — Z5112 Encounter for antineoplastic immunotherapy: Secondary | ICD-10-CM | POA: Diagnosis not present

## 2020-10-08 LAB — CMP (CANCER CENTER ONLY)
ALT: 29 U/L (ref 0–44)
AST: 16 U/L (ref 15–41)
Albumin: 3.7 g/dL (ref 3.5–5.0)
Alkaline Phosphatase: 81 U/L (ref 38–126)
Anion gap: 6 (ref 5–15)
BUN: 22 mg/dL (ref 8–23)
CO2: 28 mmol/L (ref 22–32)
Calcium: 9.4 mg/dL (ref 8.9–10.3)
Chloride: 98 mmol/L (ref 98–111)
Creatinine: 0.68 mg/dL (ref 0.61–1.24)
GFR, Estimated: >60 mL/min (ref >60)
Glucose, Bld: 172 mg/dL — ABNORMAL HIGH (ref 70–99)
Potassium: 4 mmol/L (ref 3.5–5.1)
Sodium: 132 mmol/L — ABNORMAL LOW (ref 135–145)
Total Bilirubin: 0.3 mg/dL (ref 0.3–1.2)
Total Protein: 6.4 g/dL — ABNORMAL LOW (ref 6.5–8.1)

## 2020-10-08 LAB — CBC WITH DIFFERENTIAL (CANCER CENTER ONLY)
Abs Immature Granulocytes: 0.15 10*3/uL — ABNORMAL HIGH (ref 0.00–0.07)
Basophils Absolute: 0 10*3/uL (ref 0.0–0.1)
Basophils Relative: 0 %
Eosinophils Absolute: 0 10*3/uL (ref 0.0–0.5)
Eosinophils Relative: 0 %
HCT: 36 % — ABNORMAL LOW (ref 39.0–52.0)
Hemoglobin: 12 g/dL — ABNORMAL LOW (ref 13.0–17.0)
Immature Granulocytes: 1 %
Lymphocytes Relative: 14 %
Lymphs Abs: 1.4 10*3/uL (ref 0.7–4.0)
MCH: 29.1 pg (ref 26.0–34.0)
MCHC: 33.3 g/dL (ref 30.0–36.0)
MCV: 87.2 fL (ref 80.0–100.0)
Monocytes Absolute: 0.7 10*3/uL (ref 0.1–1.0)
Monocytes Relative: 7 %
Neutro Abs: 8.1 10*3/uL — ABNORMAL HIGH (ref 1.7–7.7)
Neutrophils Relative %: 78 %
Platelet Count: 214 10*3/uL (ref 150–400)
RBC: 4.13 MIL/uL — ABNORMAL LOW (ref 4.22–5.81)
RDW: 18.6 % — ABNORMAL HIGH (ref 11.5–15.5)
WBC Count: 10.4 10*3/uL (ref 4.0–10.5)
nRBC: 0 % (ref 0.0–0.2)

## 2020-10-08 MED ORDER — SODIUM CHLORIDE 0.9% FLUSH
10.0000 mL | INTRAVENOUS | Status: DC | PRN
  Administered 2020-10-08: 10 mL
  Filled 2020-10-08: qty 10

## 2020-10-08 MED ORDER — DIPHENHYDRAMINE HCL 50 MG/ML IJ SOLN
25.0000 mg | Freq: Once | INTRAMUSCULAR | Status: AC
  Administered 2020-10-08: 25 mg via INTRAVENOUS

## 2020-10-08 MED ORDER — FAMOTIDINE 20 MG IN NS 100 ML IVPB
20.0000 mg | Freq: Once | INTRAVENOUS | Status: AC
  Administered 2020-10-08: 20 mg via INTRAVENOUS
  Filled 2020-10-08: qty 20

## 2020-10-08 MED ORDER — SODIUM CHLORIDE 0.9 % IV SOLN
240.0000 mg | Freq: Once | INTRAVENOUS | Status: AC
  Administered 2020-10-08: 240 mg via INTRAVENOUS
  Filled 2020-10-08: qty 24

## 2020-10-08 MED ORDER — HEPARIN SOD (PORK) LOCK FLUSH 100 UNIT/ML IV SOLN
500.0000 [IU] | Freq: Once | INTRAVENOUS | Status: AC | PRN
  Administered 2020-10-08: 500 [IU]
  Filled 2020-10-08: qty 5

## 2020-10-08 MED ORDER — SODIUM CHLORIDE 0.9 % IV SOLN
1.0000 mg/kg | Freq: Once | INTRAVENOUS | Status: AC
  Administered 2020-10-08: 90 mg via INTRAVENOUS
  Filled 2020-10-08: qty 18

## 2020-10-08 MED ORDER — SODIUM CHLORIDE 0.9 % IV SOLN
Freq: Once | INTRAVENOUS | Status: AC
  Filled 2020-10-08: qty 250

## 2020-10-08 MED ORDER — DIPHENHYDRAMINE HCL 50 MG/ML IJ SOLN
INTRAMUSCULAR | Status: AC
  Filled 2020-10-08: qty 1

## 2020-10-08 NOTE — Patient Instructions (Signed)
[facility]  Discharge Instructions: Thank you for choosing [facility] to provide your oncology and hematology care.   If you have a lab appointment with the [facility], please go directly to the [facility] and check in at the registration area.  Wear comfortable clothing and clothing appropriate for easy access to any Portacath or PICC line.   We strive to give you quality time with your provider. You may need to reschedule your appointment if you arrive late (15 or more minutes).  Arriving late affects you and other patients whose appointments are after yours.  Also, if you miss three or more appointments without notifying the office, you may be dismissed from the clinic at the provider's discretion.      For prescription refill requests, have your pharmacy contact our office and allow 72 hours for refills to be completed.    Today you received the following chemotherapy and/or immunotherapy agents yervoy, opdivo, benadryl, pepcid    To help prevent nausea and vomiting after your treatment, we encourage you to take your nausea medication as directed.  BELOW ARE SYMPTOMS THAT SHOULD BE REPORTED IMMEDIATELY: . *FEVER GREATER THAN 100.4 F (38 C) OR HIGHER . *CHILLS OR SWEATING . *NAUSEA AND VOMITING THAT IS NOT CONTROLLED WITH YOUR NAUSEA MEDICATION . *UNUSUAL SHORTNESS OF BREATH . *UNUSUAL BRUISING OR BLEEDING . *URINARY PROBLEMS (pain or burning when urinating, or frequent urination) . *BOWEL PROBLEMS (unusual diarrhea, constipation, pain near the anus) . TENDERNESS IN MOUTH AND THROAT WITH OR WITHOUT PRESENCE OF ULCERS (sore throat, sores in mouth, or a toothache) . UNUSUAL RASH, SWELLING OR PAIN  . UNUSUAL VAGINAL DISCHARGE OR ITCHING   Items with * indicate a potential emergency and should be followed up as soon as possible or go to the Emergency Department if any problems should occur.  Please show the CHEMOTHERAPY ALERT CARD or  IMMUNOTHERAPY ALERT CARD at check-in to the Emergency Department and triage nurse. Should you have questions after your visit or need to cancel or reschedule your appointment, please contact [facility]  [phone] and follow the prompts.  Office hours are 8:00 a.m. to 4:30 p.m. Monday - Friday. Please note that voicemails left after 4:00 p.m. may not be returned until the following business day.  We are closed weekends and major holidays. You have access to a nurse at all times for urgent questions. Please call the main number to the clinic [phone] and follow the prompts.  For any non-urgent questions, you may also contact your provider using MyChart. We now offer e-Visits for anyone 33 and older to request care online for non-urgent symptoms. For details visit [email or web address].   Also download the MyChart app! Go to the app store, search "MyChart", open the app, select [facility], and log in with your MyChart username and password.  Due to Covid, a mask is required upon entering the hospital/clinic. If you do not have a mask, one will be given to you upon arrival. For doctor visits, patients may have 1 support person aged 77 or older with them. For treatment visits, patients cannot have anyone with them due to current Covid guidelines and our immunocompromised population.

## 2020-10-08 NOTE — Progress Notes (Signed)
Patient is here for first treatment. Wife was unable to join him. I called her in attempt to see if she had any questions or concerns however I was unable to make contact. Message left encouraging her to call the on-call service over the long weekend if she had any questions or concerns.   Oncology Nurse Navigator Documentation  Oncology Nurse Navigator Flowsheets 10/08/2020  Abnormal Finding Date -  Confirmed Diagnosis Date -  Diagnosis Status -  Phase of Treatment Chemo  Chemotherapy Actual Start Date: 10/08/2020  Chemotherapy Expected End Date: 12/10/2020  Radiation Actual Start Date: -  Radiation Actual End Date: -  Navigator Follow Up Date: 10/29/2020  Navigator Follow Up Reason: Follow-up Appointment;Chemotherapy  Navigator Location [facility]  Referral Date to RadOnc/MedOnc -  Navigator Encounter Type Treatment  Telephone -  Treatment Initiated Date -  Patient Visit Type MedOnc  Treatment Phase Active Tx;First Chemo Tx  Barriers/Navigation Needs Coordination of Care;Education  Education Other  Interventions Education;Psycho-Social Support  Acuity Level 2-Minimal Needs (1-2 Barriers Identified)  Coordination of Care -  Education Method Verbal  Support Groups/Services Friends and Family  Time Spent with Patient 30

## 2020-10-09 LAB — T4: T4, Total: 5.7 ug/dL (ref 4.5–12.0)

## 2020-10-10 ENCOUNTER — Encounter (HOSPITAL_COMMUNITY): Payer: Self-pay

## 2020-10-10 ENCOUNTER — Other Ambulatory Visit: Payer: Self-pay

## 2020-10-10 ENCOUNTER — Emergency Department (HOSPITAL_COMMUNITY)
Admission: EM | Admit: 2020-10-10 | Discharge: 2020-10-10 | Disposition: A | Discharge status: Home / Self Care | Payer: Medicare Other | Attending: Emergency Medicine | Admitting: Emergency Medicine

## 2020-10-10 ENCOUNTER — Encounter: Payer: Self-pay | Admitting: Hematology & Oncology

## 2020-10-10 DIAGNOSIS — Z79899 Other long term (current) drug therapy: Secondary | ICD-10-CM | POA: Diagnosis not present

## 2020-10-10 DIAGNOSIS — Z85118 Personal history of other malignant neoplasm of bronchus and lung: Secondary | ICD-10-CM | POA: Insufficient documentation

## 2020-10-10 DIAGNOSIS — E109 Type 1 diabetes mellitus without complications: Secondary | ICD-10-CM | POA: Diagnosis not present

## 2020-10-10 DIAGNOSIS — I10 Essential (primary) hypertension: Secondary | ICD-10-CM | POA: Insufficient documentation

## 2020-10-10 DIAGNOSIS — F17210 Nicotine dependence, cigarettes, uncomplicated: Secondary | ICD-10-CM | POA: Diagnosis not present

## 2020-10-10 DIAGNOSIS — Z7984 Long term (current) use of oral hypoglycemic drugs: Secondary | ICD-10-CM | POA: Insufficient documentation

## 2020-10-10 DIAGNOSIS — Z951 Presence of aortocoronary bypass graft: Secondary | ICD-10-CM | POA: Insufficient documentation

## 2020-10-10 DIAGNOSIS — L98421 Non-pressure chronic ulcer of back limited to breakdown of skin: Secondary | ICD-10-CM

## 2020-10-10 DIAGNOSIS — Z794 Long term (current) use of insulin: Secondary | ICD-10-CM | POA: Diagnosis not present

## 2020-10-10 DIAGNOSIS — K625 Hemorrhage of anus and rectum: Secondary | ICD-10-CM | POA: Diagnosis not present

## 2020-10-10 DIAGNOSIS — L98491 Non-pressure chronic ulcer of skin of other sites limited to breakdown of skin: Secondary | ICD-10-CM | POA: Insufficient documentation

## 2020-10-10 LAB — COMPREHENSIVE METABOLIC PANEL
ALT: 44 U/L (ref 0–44)
AST: 24 U/L (ref 15–41)
Albumin: 3.2 g/dL — ABNORMAL LOW (ref 3.5–5.0)
Alkaline Phosphatase: 79 U/L (ref 38–126)
Anion gap: 7 (ref 5–15)
BUN: 29 mg/dL — ABNORMAL HIGH (ref 8–23)
CO2: 28 mmol/L (ref 22–32)
Calcium: 9 mg/dL (ref 8.9–10.3)
Chloride: 98 mmol/L (ref 98–111)
Creatinine, Ser: 0.85 mg/dL (ref 0.61–1.24)
GFR, Estimated: >60 mL/min (ref >60)
Glucose, Bld: 329 mg/dL — ABNORMAL HIGH (ref 70–99)
Potassium: 4.8 mmol/L (ref 3.5–5.1)
Sodium: 133 mmol/L — ABNORMAL LOW (ref 135–145)
Total Bilirubin: 0.4 mg/dL (ref 0.3–1.2)
Total Protein: 6.7 g/dL (ref 6.5–8.1)

## 2020-10-10 LAB — CBC
HCT: 38.1 % — ABNORMAL LOW (ref 39.0–52.0)
Hemoglobin: 12.5 g/dL — ABNORMAL LOW (ref 13.0–17.0)
MCH: 28.9 pg (ref 26.0–34.0)
MCHC: 32.8 g/dL (ref 30.0–36.0)
MCV: 88.2 fL (ref 80.0–100.0)
Platelets: 251 10*3/uL (ref 150–400)
RBC: 4.32 MIL/uL (ref 4.22–5.81)
RDW: 18.7 % — ABNORMAL HIGH (ref 11.5–15.5)
WBC: 7.8 10*3/uL (ref 4.0–10.5)
nRBC: 0 % (ref 0.0–0.2)

## 2020-10-10 LAB — TYPE AND SCREEN
ABO/RH(D): O POS
Antibody Screen: NEGATIVE

## 2020-10-10 LAB — POC OCCULT BLOOD, ED: Fecal Occult Bld: NEGATIVE

## 2020-10-10 NOTE — ED Provider Notes (Signed)
[facility] DEPT Provider Note   CSN: [id number] Arrival date & time: 10/10/20  1428     History Chief Complaint  Patient presents with  . Blood In Stools    Bryce Powell is a 80 y.o. male.  The history is provided by the patient.  Rectal Bleeding Quality:  Bright red Amount:  Unable to specify Duration: 1 episode. Timing:  Intermittent Chronicity:  New Context: spontaneously   Relieved by:  Nothing Worsened by:  Nothing (Does do a lot of sitting and has tried to pad his bottom by using pillows.) Ineffective treatments:  None tried Associated symptoms: no abdominal pain, no fever, no hematemesis and no vomiting   Risk factors: no anticoagulant use        Past Medical History:  Diagnosis Date  . Current smoker   . Diabetes ([place])   . Goals of care, counseling/discussion 09/29/2020  . HTN (hypertension)   . PAF (paroxysmal atrial fibrillation) ([place])    post CABG  . 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 Sleep apnea   . Tobacco abuse     Patient Active Problem List   Diagnosis Date Noted  . Goals of care, counseling/discussion 09/29/2020  . Lung cancer metastatic to brain ([place])   . Brain edema ([place]) 09/03/2020  . Diabetes ([place])   . 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 Tobacco abuse   . NSTEMI (non-ST elevated myocardial infarction) ([place]) 03/28/2017  . HTN (hypertension) 03/28/2017  . Diabetes 1.5, managed as type 1 ([place]) 03/28/2017  . Angina at rest [facility]) 03/28/2017    Past Surgical History:  Procedure Laterality Date  . BRONCHIAL BIOPSY  09/06/2020   Procedure: BRONCHIAL BIOPSIES;  Surgeon: Rigoberto Noel, MD;  Location: [facility];  Service: Cardiopulmonary;;  . BRONCHIAL BRUSHINGS  09/06/2020   Procedure: BRONCHIAL BRUSHINGS;  Surgeon: Rigoberto Noel, MD;  Location: [facility];  Service: Cardiopulmonary;;  . BRONCHIAL NEEDLE ASPIRATION BIOPSY   09/06/2020   Procedure: BRONCHIAL NEEDLE ASPIRATION BIOPSIES;  Surgeon: Rigoberto Noel, MD;  Location: [facility];  Service: Cardiopulmonary;;  . BRONCHIAL WASHINGS  09/06/2020   Procedure: BRONCHIAL WASHINGS;  Surgeon: Rigoberto Noel, MD;  Location: [facility];  Service: Cardiopulmonary;;  BAL  . CORONARY ARTERY BYPASS GRAFT N/A 03/30/2017   Procedure: CORONARY ARTERY BYPASS GRAFTING (CABG) x Three, using left internal mammary artery and right leg greater saphenous vein harvested endoscopically;  Surgeon: Rexene Alberts, MD;  Location: [facility];  Service: Open Heart Surgery;  Laterality: N/A;  . ENDOBRONCHIAL ULTRASOUND Bilateral 09/06/2020   Procedure: ENDOBRONCHIAL ULTRASOUND;  Surgeon: Rigoberto Noel, MD;  Location: [facility];  Service: Cardiopulmonary;  Laterality: Bilateral;  . IR IMAGING GUIDED PORT INSERTION  10/04/2020  . LEFT HEART CATH AND CORONARY ANGIOGRAPHY N/A 03/29/2017   Procedure: LEFT HEART CATH AND CORONARY ANGIOGRAPHY;  Surgeon: Lorretta Harp, MD;  Location: [facility];  Service: Cardiovascular;  Laterality: N/A;  . TEE WITHOUT CARDIOVERSION N/A 03/30/2017   Procedure: TRANSESOPHAGEAL ECHOCARDIOGRAM (TEE);  Surgeon: Rexene Alberts, MD;  Location: [facility];  Service: Open Heart Surgery;  Laterality: N/A;  . VIDEO BRONCHOSCOPY N/A 09/06/2020   Procedure: VIDEO BRONCHOSCOPY WITHOUT FLUORO;  Surgeon: Rigoberto Noel, MD;  Location: [facility];  Service: Cardiopulmonary;  Laterality: N/A;       Family History  Problem  Relation Age of Onset  . Diabetes Mother   . Heart attack Mother   . Heart attack Father     Social History   Tobacco Use  . Smoking status: Current Some Day Smoker    Packs/day: 1.00    Years: 60.00    Pack years: 60.00    Types: Cigarettes    Last attempt to quit: 03/28/2017    Years since quitting: 3.5  . Smokeless tobacco: Never Used  . Tobacco comment: Pt states , "I will probably quit now."  Vaping Use  . Vaping Use: Never used   Substance Use Topics  . Alcohol use: No  . Drug use: No    Home Medications Prior to Admission medications   Medication Sig Start Date End Date Taking? Authorizing Provider  acetaminophen (TYLENOL) 500 MG tablet Take 2 tablets (1,000 mg total) by mouth every 6 (six) hours. Patient taking differently: Take 1,000 mg by mouth every 6 (six) hours as needed for moderate pain. 04/04/17   Elgie Collard, PA-C  allopurinol (ZYLOPRIM) 300 MG tablet Take 1 tablet (300 mg total) by mouth daily. 08/03/20   Sanjuana Kava, MD  amLODipine (NORVASC) 5 MG tablet Take 5 mg by mouth daily. 07/21/19   [provider]  esomeprazole (NEXIUM) 20 MG capsule Take 20 mg by mouth daily. 09/27/20   [provider]  glipiZIDE (GLUCOTROL XL) 10 MG 24 hr tablet Take 10 mg by mouth daily. 09/08/20   [provider]  insulin glargine (LANTUS) 100 UNIT/ML Solostar Pen Inject 14 Units into the skin daily. 09/06/20   Donne Hazel, MD  insulin lispro (HUMALOG KWIKPEN) 100 UNIT/ML KwikPen Inject 5 Units into the skin 3 (three) times daily. 09/06/20   Donne Hazel, MD  levETIRAcetam (KEPPRA) 500 MG tablet Take 1 tablet (500 mg total) by mouth 2 (two) times daily. 09/06/20 10/06/20  Donne Hazel, MD  linaclotide [facility]) 145 MCG CAPS capsule Take 145 mcg by mouth daily before breakfast.    [provider]  losartan (COZAAR) 50 MG tablet Take 50 mg daily by mouth.    [provider]  metFORMIN (GLUCOPHAGE) 1000 MG tablet Take 1 tablet by mouth 2 (two) times daily. 09/08/20   [provider]  nitroGLYCERIN (NITROSTAT) 0.4 MG SL tablet Place 1 tablet (0.4 mg total) under the tongue every 5 (five) minutes as needed for chest pain. 06/12/17 09/11/19  Herminio Commons, MD  RELION PEN NEEDLE 31G/8MM 31G X 8 MM MISC USE AS DIRECTED FIVE TIMES DAILY 09/27/20   [provider]  rosuvastatin (CRESTOR) 5 MG tablet TAKE 1 TABLET BY MOUTH  DAILY 05/27/18   Herminio Commons, MD     Allergies    Atorvastatin  Review of Systems   Review of Systems  Constitutional: Negative for chills and fever.  HENT: Negative for ear pain and sore throat.   Eyes: Negative for pain and visual disturbance.  Respiratory: Negative for cough and shortness of breath.   Cardiovascular: Negative for chest pain and palpitations.  Gastrointestinal: Positive for blood in stool and hematochezia. Negative for abdominal pain, hematemesis and vomiting.  Genitourinary: Negative for dysuria and hematuria.  Musculoskeletal: Negative for arthralgias and back pain.  Skin: Negative for color change and rash.  Neurological: Negative for seizures and syncope.  All other systems reviewed and are negative.   Physical Exam Updated Vital Signs BP 123/67 (BP Location: Left Arm)   Pulse 64   Temp 98.1 F (36.7  C) (Oral)   Resp 16   Ht 6' (1.829 m)   Wt 90.7 kg   SpO2 100%   BMI 27.12 kg/m   Physical Exam Vitals and nursing note reviewed.  Constitutional:      Appearance: He is well-developed.  HENT:     Head: Normocephalic and atraumatic.  Eyes:     Conjunctiva/sclera: Conjunctivae normal.  Cardiovascular:     Rate and Rhythm: Normal rate and regular rhythm.     Heart sounds: No murmur heard.   Pulmonary:     Effort: Pulmonary effort is normal. No respiratory distress.     Breath sounds: Normal breath sounds.  Abdominal:     Palpations: Abdomen is soft.     Tenderness: There is no abdominal tenderness.  Genitourinary:    Comments: On the sacral area, there are shallow abrasions consistent with early, developing pressure ulcer.  Brown stool present without bleeding. Musculoskeletal:        General: Normal range of motion.     Cervical back: Neck supple.  Skin:    General: Skin is warm and dry.  Neurological:     General: No focal deficit present.     Mental Status: He is alert.  Psychiatric:        Mood and Affect: Mood normal.        Behavior: Behavior normal.     ED  Results / Procedures / Treatments   Labs (all labs ordered are listed, but only abnormal results are displayed) Labs Reviewed  COMPREHENSIVE METABOLIC PANEL - Abnormal; Notable for the following components:      Result Value   Sodium 133 (*)    Glucose, Bld 329 (*)    BUN 29 (*)    Albumin 3.2 (*)    All other components within normal limits  CBC - Abnormal; Notable for the following components:   Hemoglobin 12.5 (*)    HCT 38.1 (*)    RDW 18.7 (*)    All other components within normal limits  POC OCCULT BLOOD, ED  TYPE AND SCREEN    EKG None  Radiology No results found.  Procedures Procedures   Medications Ordered in ED Medications - No data to display  ED Course  I have reviewed the triage vital signs and the nursing notes.  Pertinent labs & imaging results that were available during my care of the patient were reviewed by me and considered in my medical decision making (see chart for details).    MDM Rules/Calculators/A&P                          Bryce Powell presented with rectal bleeding.  It turns out he has an early pressure ulcer on his sacrum, and I think this is likely the source of bleeding.  He has noticed some pain when sitting and has tried to address that by padding his chair.  However, I do think he needs to start some skin care.  Otherwise, hemoglobin is at his baseline.  He is slightly hyperglycemic, and this is in keeping with his history of diabetes.  Abdominal exam does not suggest the need for imaging.  He will be discharged home. Final Clinical Impression(s) / ED Diagnoses Final diagnoses:  Sacral ulcer, limited to breakdown of skin [facility])    Rx / DC Orders ED Discharge Orders    None       Arnaldo Natal, MD 10/10/20 1728

## 2020-10-10 NOTE — ED Provider Notes (Signed)
Emergency Medicine Provider Triage Evaluation Note  Petro Talent , a 80 y.o. male  was evaluated in triage.  Pt complains of blood in stool. Pt states his wife told him he had blood in his shorts. He states he did not see any.  Review of Systems  Positive: Blood in stool Negative: abd pain  Physical Exam  BP 123/67 (BP Location: Left Arm)   Pulse 64   Temp 98.1 F (36.7 C) (Oral)   Resp 16   Ht 6' (1.829 m)   Wt 90.7 kg   SpO2 100%   BMI 27.12 kg/m  Gen:   Awake, no distress   Resp:  Normal effort  MSK:   Moves extremities without difficulty  Other:  abd soft and nontender  Medical Decision Making  Medically screening exam initiated at 3:07 PM.  Appropriate orders placed.  Kennedy Bohanon was informed that the remainder of the evaluation will be completed by another provider, this initial triage assessment does not replace that evaluation, and the importance of remaining in the ED until their evaluation is complete.     Bishop Dublin 10/10/20 1508    Dorie Rank, MD 10/11/20 1530

## 2020-10-10 NOTE — Discharge Instructions (Addendum)
Keep the sacral area clean and dry.  Apply a barrier cream or powder such as diaper cream.

## 2020-10-10 NOTE — ED Triage Notes (Signed)
Patient reports that his wife noticed blood in the toilet. patient states he did not see any. patient does not know if the blood was bright red or dark in color. Patient denies any abdominal or rectal pain.

## 2020-10-12 ENCOUNTER — Encounter: Payer: Self-pay | Admitting: *Deleted

## 2020-10-12 LAB — TSH: TSH: 1.751 u[IU]/mL (ref 0.320–4.118)

## 2020-10-12 NOTE — Progress Notes (Signed)
Patient received his first chemotherapy treatment this past Friday. Over the weekend he was seen at the ED for reported GI bleeding, however his workup was negative, but did find a sacral pressure ulcer.   Called and spoke to the patient's wife, Lorene. She states patient has been sleeping all weekend. He will get up to go to the bathroom, but then go right back to bed. She states the pressure ulcer hasn't had any further bleeding and looks better than it did. They have a follow up appointment tomorrow with his PCP. He has not been eating and drinking very well, as he's usually sleeping. Patient denies any n/v or other symptoms.   Reviewed with Lorene the importance of keeping up intake. Encouraged her to wake up patient for meals and encourage both solid and liquid intake at those times. She understands and agrees. She states she is cooking a good meal for lunch and will get him up at that time. She is asked to call tomorrow to update up on his intake for today.   Oncology Nurse Navigator Documentation  Oncology Nurse Navigator Flowsheets 10/12/2020  Abnormal Finding Date -  Confirmed Diagnosis Date -  Diagnosis Status -  Phase of Treatment -  Chemotherapy Actual Start Date: -  Chemotherapy Expected End Date: -  Radiation Actual Start Date: -  Radiation Actual End Date: -  Navigator Follow Up Date: 10/29/2020  Navigator Follow Up Reason: Follow-up Appointment;Chemotherapy  Navigator Location [facility]  Referral Date to RadOnc/MedOnc -  Navigator Encounter Type Telephone  Telephone -  Treatment Initiated Date -  Patient Visit Type MedOnc  Treatment Phase Active Tx  Barriers/Navigation Needs Coordination of Care;Education  Education Pain/ Symptom Management;Other  Interventions Education;Psycho-Social Support  Acuity Level 2-Minimal Needs (1-2 Barriers Identified)  Coordination of Care -  Education Method Verbal  Support Groups/Services Friends and Family  Time Spent with Patient  4

## 2020-10-13 ENCOUNTER — Encounter: Payer: Self-pay | Admitting: *Deleted

## 2020-10-13 NOTE — Progress Notes (Signed)
Patient's wife, Lorene, called to update on his PCP appointment this morning. She states the MD believes his pressure ulcer is doing well, with no additional concerns. He has ordered Home Health to try to help the family with management of patient. She also mentions that the PCP prescribed pain medicine for c/o pain in the feet. She doesn't know the name of the medication.   Patient is still very lethargic and wanting to stay in bed. She states he still isn't eating or drinking very well. She is concerned about the pain medication that was prescribed and whether or not it will increase his sedation.   Encouraged Lorene to continue to encourage po intake including solids and liquids. She has been offering frequently, even setting patient up at the side of his bed. If he makes requests she will get the patient what he desires. Reinforced her actions and stated she is doing the correct thing and doing well with her encouragement of the patient.   Suggested that patient take his newly prescribed pain medication before bed, which might help him sleep more soundly at night, but that during the day, as long as pain isn't severe, to continue using the tylenol he had been taking. She agrees with this plan.  Informed Lorene that I will call her back tomorrow and get update on patient status.   Oncology Nurse Navigator Documentation  Oncology Nurse Navigator Flowsheets 10/13/2020  Abnormal Finding Date -  Confirmed Diagnosis Date -  Diagnosis Status -  Phase of Treatment -  Chemotherapy Actual Start Date: -  Chemotherapy Expected End Date: -  Radiation Actual Start Date: -  Radiation Actual End Date: -  Navigator Follow Up Date: 10/14/2020  Navigator Follow Up Reason: Symptom Management  Navigator Location [facility]  Referral Date to RadOnc/MedOnc -  Navigator Encounter Type Telephone  Telephone Patient Update;Symptom Mgt;Incoming Call  Treatment Initiated Date -  Patient Visit Type MedOnc   Treatment Phase Active Tx  Barriers/Navigation Needs Coordination of Care;Education  Education Pain/ Symptom Management  Interventions Education;Psycho-Social Support  Acuity Level 2-Minimal Needs (1-2 Barriers Identified)  Coordination of Care -  Education Method Verbal  Support Groups/Services Friends and Family  Time Spent with Patient 30

## 2020-10-14 ENCOUNTER — Encounter: Payer: Self-pay | Admitting: *Deleted

## 2020-10-14 NOTE — Progress Notes (Signed)
Called and spoke to patient's wife, Bryce Powell. She states patient has eaten some meals today. She was able to get him to eat breakfast and lunch. She states he's drinking liquids mentioning a coffee and two bottles of water. She states he has denied pain, so she has not given him anything for pain, including tylenol.  Encouraged Bryce Powell and reassured her that she is doing great being his caregiver and everything she is doing is correct. She is also encouraged to call back if she has any questions or concerns.   Oncology Nurse Navigator Documentation  Oncology Nurse Navigator Flowsheets 10/14/2020  Abnormal Finding Date -  Confirmed Diagnosis Date -  Diagnosis Status -  Phase of Treatment -  Chemotherapy Actual Start Date: -  Chemotherapy Expected End Date: -  Radiation Actual Start Date: -  Radiation Actual End Date: -  Navigator Follow Up Date: 10/29/2020  Navigator Follow Up Reason: Follow-up Appointment;Chemotherapy  Navigator Location [facility]  Referral Date to RadOnc/MedOnc -  Navigator Encounter Type Telephone  Telephone Patient Update;Outgoing Call  Treatment Initiated Date -  Patient Visit Type MedOnc  Treatment Phase Active Tx  Barriers/Navigation Needs Coordination of Care;Education  Education Pain/ Symptom Management  Interventions Education;Psycho-Social Support  Acuity Level 2-Minimal Needs (1-2 Barriers Identified)  Coordination of Care -  Education Method Verbal  Support Groups/Services Friends and Family  Time Spent with Patient 30

## 2020-10-20 ENCOUNTER — Telehealth: Payer: Self-pay | Admitting: *Deleted

## 2020-10-20 NOTE — Telephone Encounter (Signed)
Call received from pt.'s wife this morning stating that pt has not been out of bed in eight days, that he is not eating or drinking and has fell twice in the past week. She states that [facility] will be visiting pt today between 11:30AM and 12:00PM. Instructed pt.'s wife to have the [facility] call this office after her visit.  Pt.'s wife states that she will ask the [facility] to call this office.

## 2020-10-20 NOTE — Telephone Encounter (Signed)
Call received from [facility] with [place].  She states that pt is able to get out of bed by himself to the [facility] and w/c, but chooses not to, that pt is eating and drinking minimal amounts and that pt is having a neuropathy type pain to his left foot. Lattie Haw states that she is going to contact Dr. Baird Cancer, pt.'s PCP regarding foot pain.  Lattie Haw said that she did speak with patient and his wife about hospice services, but pt is refusing hospice assistance at this time.  Dr. Marin Olp notified of above.

## 2020-10-22 LAB — ACID FAST CULTURE WITH REFLEXED SENSITIVITIES (MYCOBACTERIA): Acid Fast Culture: NEGATIVE

## 2020-10-25 ENCOUNTER — Ambulatory Visit
Admission: RE | Admit: 2020-10-25 | Discharge: 2020-10-25 | Disposition: A | Discharge status: Home / Self Care | Payer: Medicare Other | Source: Ambulatory Visit | Attending: Radiation Oncology | Admitting: Radiation Oncology

## 2020-10-25 DIAGNOSIS — C7931 Secondary malignant neoplasm of brain: Secondary | ICD-10-CM

## 2020-10-25 NOTE — Progress Notes (Signed)
  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 He was unable to come to the phone so I spoke with his wife, Bryce. Bryce reports that her husband is no longer able to walk or get up on his own. He mostly stays in the bed or uses a wheelchair that was loaned to them by a local church to get him to his doctor appointments. There was a period of about ten days that he would not get out of the bed at all, and was not eating or drinking. Bryce Powell has resumed steroids under the management of Dr. Marin Olp and has noticed improvement with eating and drinking, but not with regaining strength in his legs or being able to walk. Mrs. Powell also reports that her husband has days where he "talks out of his head' and that he is very confused about what is going on around him.       Currently there is a home health nurse coming out to the house for physical therapy. [place] has a mixed response with her depending on his mental clarity at the time. Per Gildardo Pounds, he may refuse treatment and "talk mean to her" or "cooperate and be sweet."         Impression/Plan: 1. 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 The patient has unfortunately declined in physical and mental status since the completion of radiotherapy. I mentioned palliative care and hospice support, but they are not interested in this service. Mrs. Vidales said she wants to keep Bryce Powell  home with her as long as possible. We discussed that we would be happy to continue to follow him as needed, but he will also continue to follow up with Dr. Marin Olp in medical oncology. His next scheduled visit with Dr. Marin Olp is Friday 6/17.    Mont Dutton R.T.(R)(T) Radiation Special Procedures Navigator

## 2020-10-26 ENCOUNTER — Encounter: Payer: Self-pay | Admitting: *Deleted

## 2020-10-26 DIAGNOSIS — C7931 Secondary malignant neoplasm of brain: Secondary | ICD-10-CM

## 2020-10-26 MED ORDER — DEXAMETHASONE 4 MG PO TABS: 4.0000 mg | ORAL_TABLET | Freq: Three times a day (TID) | ORAL | 2 refills | Status: AC

## 2020-10-26 NOTE — Progress Notes (Signed)
Patient's family would like to know if patient can restart on steroids. They states he was doing so much better when he was on daily prednisone and once it was stopped he has declined rapidly. They state patient's neurologist prescribed 4mg  daily times four, and only after two doses his condition has improved. They would like Dr Marin Olp to restart him longer term.  Spoke to Dr Marin Olp. He would like patient to start on Decadron 4mg  TID.   Notified patient's wife and daughter. Prescription sent.   Oncology Nurse Navigator Documentation  Oncology Nurse Navigator Flowsheets 10/26/2020  Abnormal Finding Date -  Confirmed Diagnosis Date -  Diagnosis Status -  Phase of Treatment -  Chemotherapy Actual Start Date: -  Chemotherapy Expected End Date: -  Radiation Actual Start Date: -  Radiation Actual End Date: -  Navigator Follow Up Date: 10/29/2020  Navigator Follow Up Reason: Follow-up Appointment;Chemotherapy  Navigator Location [facility]  Referral Date to RadOnc/MedOnc -  Navigator Encounter Type Telephone  Telephone Symptom Mgt;Incoming Call;Medication Assistance  Treatment Initiated Date -  Patient Visit Type MedOnc  Treatment Phase Active Tx  Barriers/Navigation Needs Coordination of Care;Education  Education Pain/ Symptom Management  Interventions Psycho-Social Support;Medication Assistance;Education  Acuity Level 2-Minimal Needs (1-2 Barriers Identified)  Coordination of Care -  Education Method Verbal  Support Groups/Services Friends and Family  Time Spent with Patient 30

## 2020-10-29 ENCOUNTER — Inpatient Hospital Stay: Payer: Medicare Other | Attending: Hematology & Oncology

## 2020-10-29 ENCOUNTER — Telehealth: Payer: Self-pay | Admitting: *Deleted

## 2020-10-29 ENCOUNTER — Encounter: Payer: Self-pay | Admitting: *Deleted

## 2020-10-29 ENCOUNTER — Encounter: Payer: Self-pay | Admitting: Hematology & Oncology

## 2020-10-29 ENCOUNTER — Inpatient Hospital Stay (HOSPITAL_BASED_OUTPATIENT_CLINIC_OR_DEPARTMENT_OTHER): Payer: Medicare Other | Admitting: Hematology & Oncology

## 2020-10-29 ENCOUNTER — Inpatient Hospital Stay: Payer: Medicare Other

## 2020-10-29 ENCOUNTER — Other Ambulatory Visit: Payer: Self-pay

## 2020-10-29 VITALS — BP 117/57 | HR 72 | Temp 97.7°F | Resp 18

## 2020-10-29 DIAGNOSIS — C7931 Secondary malignant neoplasm of brain: Secondary | ICD-10-CM | POA: Diagnosis not present

## 2020-10-29 DIAGNOSIS — C3411 Malignant neoplasm of upper lobe, right bronchus or lung: Secondary | ICD-10-CM | POA: Insufficient documentation

## 2020-10-29 DIAGNOSIS — C3490 Malignant neoplasm of unspecified part of unspecified bronchus or lung: Secondary | ICD-10-CM | POA: Diagnosis not present

## 2020-10-29 DIAGNOSIS — Z79899 Other long term (current) drug therapy: Secondary | ICD-10-CM | POA: Diagnosis not present

## 2020-10-29 DIAGNOSIS — Z5112 Encounter for antineoplastic immunotherapy: Secondary | ICD-10-CM | POA: Insufficient documentation

## 2020-10-29 DIAGNOSIS — C779 Secondary and unspecified malignant neoplasm of lymph node, unspecified: Secondary | ICD-10-CM | POA: Diagnosis not present

## 2020-10-29 LAB — CBC WITH DIFFERENTIAL (CANCER CENTER ONLY)
Abs Immature Granulocytes: 0.16 10*3/uL — ABNORMAL HIGH (ref 0.00–0.07)
Basophils Absolute: 0 10*3/uL (ref 0.0–0.1)
Basophils Relative: 0 %
Eosinophils Absolute: 0 10*3/uL (ref 0.0–0.5)
Eosinophils Relative: 0 %
HCT: 37.1 % — ABNORMAL LOW (ref 39.0–52.0)
Hemoglobin: 12.6 g/dL — ABNORMAL LOW (ref 13.0–17.0)
Immature Granulocytes: 1 %
Lymphocytes Relative: 13 %
Lymphs Abs: 1.7 10*3/uL (ref 0.7–4.0)
MCH: 29.6 pg (ref 26.0–34.0)
MCHC: 34 g/dL (ref 30.0–36.0)
MCV: 87.1 fL (ref 80.0–100.0)
Monocytes Absolute: 0.8 10*3/uL (ref 0.1–1.0)
Monocytes Relative: 6 %
Neutro Abs: 11.2 10*3/uL — ABNORMAL HIGH (ref 1.7–7.7)
Neutrophils Relative %: 80 %
Platelet Count: 344 10*3/uL (ref 150–400)
RBC: 4.26 MIL/uL (ref 4.22–5.81)
RDW: 16.2 % — ABNORMAL HIGH (ref 11.5–15.5)
WBC Count: 13.9 10*3/uL — ABNORMAL HIGH (ref 4.0–10.5)
nRBC: 0 % (ref 0.0–0.2)

## 2020-10-29 LAB — CMP (CANCER CENTER ONLY)
ALT: 50 U/L — ABNORMAL HIGH (ref 0–44)
AST: 20 U/L (ref 15–41)
Albumin: 3.6 g/dL (ref 3.5–5.0)
Alkaline Phosphatase: 86 U/L (ref 38–126)
Anion gap: 8 (ref 5–15)
BUN: 25 mg/dL — ABNORMAL HIGH (ref 8–23)
CO2: 28 mmol/L (ref 22–32)
Calcium: 9.8 mg/dL (ref 8.9–10.3)
Chloride: 98 mmol/L (ref 98–111)
Creatinine: 0.85 mg/dL (ref 0.61–1.24)
GFR, Estimated: >60 mL/min (ref >60)
Glucose, Bld: 285 mg/dL — ABNORMAL HIGH (ref 70–99)
Potassium: 3.7 mmol/L (ref 3.5–5.1)
Sodium: 134 mmol/L — ABNORMAL LOW (ref 135–145)
Total Bilirubin: 0.3 mg/dL (ref 0.3–1.2)
Total Protein: 6.5 g/dL (ref 6.5–8.1)

## 2020-10-29 LAB — TSH: TSH: 0.877 u[IU]/mL (ref 0.350–4.500)

## 2020-10-29 MED ORDER — HEPARIN SOD (PORK) LOCK FLUSH 100 UNIT/ML IV SOLN
500.0000 [IU] | Freq: Once | INTRAVENOUS | Status: AC | PRN
  Administered 2020-10-29: 500 [IU]
  Filled 2020-10-29: qty 5

## 2020-10-29 MED ORDER — SODIUM CHLORIDE 0.9 % IV SOLN
2.7300 mg/kg | Freq: Once | INTRAVENOUS | Status: AC
  Administered 2020-10-29: 240 mg via INTRAVENOUS
  Filled 2020-10-29: qty 24

## 2020-10-29 MED ORDER — SODIUM CHLORIDE 0.9 % IV SOLN
1.0000 mg/kg | Freq: Once | INTRAVENOUS | Status: AC
  Administered 2020-10-29: 90 mg via INTRAVENOUS
  Filled 2020-10-29: qty 18

## 2020-10-29 MED ORDER — DIPHENHYDRAMINE HCL 50 MG/ML IJ SOLN
25.0000 mg | Freq: Once | INTRAMUSCULAR | Status: AC
  Administered 2020-10-29: 25 mg via INTRAVENOUS

## 2020-10-29 MED ORDER — DIPHENHYDRAMINE HCL 50 MG/ML IJ SOLN
INTRAMUSCULAR | Status: AC
  Filled 2020-10-29: qty 1

## 2020-10-29 MED ORDER — SODIUM CHLORIDE 0.9 % IV SOLN
Freq: Once | INTRAVENOUS | Status: AC
  Filled 2020-10-29: qty 250

## 2020-10-29 MED ORDER — FAMOTIDINE 20 MG IN NS 100 ML IVPB
20.0000 mg | Freq: Once | INTRAVENOUS | Status: AC
  Administered 2020-10-29: 20 mg via INTRAVENOUS
  Filled 2020-10-29: qty 100

## 2020-10-29 MED ORDER — SODIUM CHLORIDE 0.9% FLUSH
10.0000 mL | INTRAVENOUS | Status: DC | PRN
  Administered 2020-10-29: 10 mL
  Filled 2020-10-29: qty 10

## 2020-10-29 NOTE — Patient Instructions (Signed)
[facility]  Discharge Instructions: Thank you for choosing [facility] to provide your oncology and hematology care.   If you have a lab appointment with the [facility], please go directly to the [facility] and check in at the registration area.  Wear comfortable clothing and clothing appropriate for easy access to any Portacath or PICC line.   We strive to give you quality time with your provider. You may need to reschedule your appointment if you arrive late (15 or more minutes).  Arriving late affects you and other patients whose appointments are after yours.  Also, if you miss three or more appointments without notifying the office, you may be dismissed from the clinic at the provider's discretion.      For prescription refill requests, have your pharmacy contact our office and allow 72 hours for refills to be completed.    Today you received the following chemotherapy and/or immunotherapy agents yervoy and opdivo   To help prevent nausea and vomiting after your treatment, we encourage you to take your nausea medication as directed.  BELOW ARE SYMPTOMS THAT SHOULD BE REPORTED IMMEDIATELY: *FEVER GREATER THAN 100.4 F (38 C) OR HIGHER *CHILLS OR SWEATING *NAUSEA AND VOMITING THAT IS NOT CONTROLLED WITH YOUR NAUSEA MEDICATION *UNUSUAL SHORTNESS OF BREATH *UNUSUAL BRUISING OR BLEEDING *URINARY PROBLEMS (pain or burning when urinating, or frequent urination) *BOWEL PROBLEMS (unusual diarrhea, constipation, pain near the anus) TENDERNESS IN MOUTH AND THROAT WITH OR WITHOUT PRESENCE OF ULCERS (sore throat, sores in mouth, or a toothache) UNUSUAL RASH, SWELLING OR PAIN  UNUSUAL VAGINAL DISCHARGE OR ITCHING   Items with * indicate a potential emergency and should be followed up as soon as possible or go to the Emergency Department if any problems should occur.  Please show the CHEMOTHERAPY ALERT CARD or IMMUNOTHERAPY ALERT CARD at check-in to  the Emergency Department and triage nurse. Should you have questions after your visit or need to cancel or reschedule your appointment, please contact [facility]  [phone] and follow the prompts.  Office hours are 8:00 a.m. to 4:30 p.m. Monday - Friday. Please note that voicemails left after 4:00 p.m. may not be returned until the following business day.  We are closed weekends and major holidays. You have access to a nurse at all times for urgent questions. Please call the main number to the clinic [phone] and follow the prompts.  For any non-urgent questions, you may also contact your provider using MyChart. We now offer e-Visits for anyone 52 and older to request care online for non-urgent symptoms. For details visit [email or web address].   Also download the MyChart app! Go to the app store, search "MyChart", open the app, select [facility], and log in with your MyChart username and password.  Due to Covid, a mask is required upon entering the hospital/clinic. If you do not have a mask, one will be given to you upon arrival. For doctor visits, patients may have 1 support person aged 17 or older with them. For treatment visits, patients cannot have anyone with them due to current Covid guidelines and our immunocompromised population.

## 2020-10-29 NOTE — Progress Notes (Signed)
Hematology and Oncology Follow Up Visit  Jerimiah Wolman [id number] 02/03/1941 80 y.o. 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 Kesling is in for follow-up.  Is been having some difficulties at home.  He has gotten weaker.  Apparently is having some trouble walking because of some weakness and foot pain.  He has diabetes so I will know if this foot pain is neuropathy.  It seems like things are getting worse when he was off his Decadron.  We restarted the Decadron 3 days ago.  His daughter says that he seems to be doing better.  He gets physical therapy a couple times a week.  He seems to be eating better.  Again his blood sugars are on the high side.  I am not sure how aggressive his blood sugar control is at home.  I really do not think it has to be all that aggressive in all honesty.  He has had no problems with seizures..  There is been no issues with bowels or bladder.  He has had no diarrhea.    Again, our focus is clearly quality of life.  Currently, his performance status is by ECOG 2.    Medications:  Current Outpatient Medications:    acetaminophen (TYLENOL) 500 MG tablet, Take 2 tablets (1,000 mg total) by mouth every 6 (six) hours. (Patient taking differently: Take 500 mg by mouth every 6 (six) hours as needed for moderate pain.), Disp: 30 tablet, Rfl: 0   allopurinol (ZYLOPRIM) 300 MG tablet, Take 1 tablet (300 mg total) by mouth daily. (Patient not taking: Reported on 10/10/2020), Disp: 30 tablet, Rfl: 5   dexamethasone (DECADRON) 4 MG tablet, Take 1 tablet (4 mg total) by mouth 3 (three) times daily., Disp: 90 tablet, Rfl: 2   esomeprazole (NEXIUM) 20 MG capsule, Take 20 mg by mouth daily., Disp: , Rfl:    insulin glargine  (LANTUS) 100 UNIT/ML Solostar Pen, Inject 14 Units into the skin daily. (Patient taking differently: Inject 20 Units into the skin daily.), Disp: 15 mL, Rfl: 0   insulin lispro (HUMALOG KWIKPEN) 100 UNIT/ML KwikPen, Inject 5 Units into the skin 3 (three) times daily. (Patient taking differently: Inject 8 Units into the skin 3 (three) times daily.), Disp: 15 mL, Rfl: 0   levETIRAcetam (KEPPRA) 500 MG tablet, Take 1 tablet (500 mg total) by mouth 2 (two) times daily., Disp: 60 tablet, Rfl: 0   losartan (COZAAR) 50 MG tablet, Take 50 mg daily by mouth., Disp: , Rfl:    Multiple Vitamin (MULTIVITAMIN WITH MINERALS) TABS tablet, Take 1 tablet by mouth daily., Disp: , Rfl:    nitroGLYCERIN (NITROSTAT) 0.4 MG SL tablet, Place 1 tablet (0.4 mg total) under the tongue every 5 (five) minutes as needed for chest pain., Disp: 25 tablet, Rfl: 3   RELION PEN NEEDLE 31G/8MM 31G X 8 MM MISC, USE AS DIRECTED FIVE TIMES DAILY, Disp: , Rfl:    rosuvastatin (CRESTOR) 5 MG tablet, TAKE 1 TABLET BY MOUTH  DAILY (Patient not taking: Reported on 10/10/2020), Disp: 90 tablet, Rfl: 1 No current facility-administered medications for this visit.  Facility-Administered Medications Ordered in Other Visits:    famotidine (PEPCID) IVPB 20 mg in NS 100 mL IVPB, 20 mg, Intravenous, Once, Gracin Mcpartland,  Peter R, MD   heparin lock flush 100 unit/mL, 500 Units, Intracatheter, Once PRN, Ennever, Peter R, MD   sodium chloride flush (NS) 0.9 % injection 10 mL, 10 mL, Intracatheter, PRN, Ennever, Peter R, MD  Allergies:  Allergies  Allergen Reactions   Atorvastatin     MUSCLE ACHES     Past Medical History, Surgical history, Social history, and Family History were reviewed and updated.  Review of Systems: Review of Systems  Constitutional: Negative.   HENT:  Negative.    Eyes: Negative.   Respiratory: Negative.    Cardiovascular: Negative.   Gastrointestinal: Negative.   Endocrine: Negative.   Genitourinary: Negative.     Musculoskeletal: Negative.   Skin: Negative.   Neurological: Negative.   Hematological: Negative.   Psychiatric/Behavioral: Negative.     Physical Exam:  oral temperature is 97.7 F (36.5 C). His blood pressure is 117/57 (abnormal) and his pulse is 72. His respiration is 18 and oxygen saturation is 99%.   Wt Readings from Last 3 Encounters:  10/10/20 200 lb (90.7 kg)  09/29/20 194 lb (88 kg)  09/10/20 190 lb 6 oz (86.4 kg)    Physical Exam Vitals reviewed.  HENT:     Head: Normocephalic and atraumatic.  Eyes:     Pupils: Pupils are equal, round, and reactive to light.  Cardiovascular:     Rate and Rhythm: Normal rate and regular rhythm.     Heart sounds: Normal heart sounds.  Pulmonary:     Effort: Pulmonary effort is normal.     Breath sounds: Normal breath sounds.  Abdominal:     General: Bowel sounds are normal.     Palpations: Abdomen is soft.  Musculoskeletal:        General: No tenderness or deformity. Normal range of motion.     Cervical back: Normal range of motion.  Lymphadenopathy:     Cervical: No cervical adenopathy.  Skin:    General: Skin is warm and dry.     Findings: No erythema or rash.  Neurological:     Mental Status: He is alert and oriented to person, place, and time.  Psychiatric:        Behavior: Behavior normal.        Thought Content: Thought content normal.        Judgment: Judgment normal.    Lab Results  Component Value Date   WBC 13.9 (H) 10/29/2020   HGB 12.6 (L) 10/29/2020   HCT 37.1 (L) 10/29/2020   MCV 87.1 10/29/2020   PLT 344 10/29/2020     Chemistry      Component Value Date/Time   NA 134 (L) 10/29/2020 0908   NA 139 04/27/2017 0857   K 3.7 10/29/2020 0908   CL 98 10/29/2020 0908   CO2 28 10/29/2020 0908   BUN 25 (H) 10/29/2020 0908   BUN 24 04/27/2017 0857   CREATININE 0.85 10/29/2020 0908      Component Value Date/Time   CALCIUM 9.8 10/29/2020 0908   ALKPHOS 86 10/29/2020 0908   AST 20 10/29/2020 0908   ALT  50 (H) 10/29/2020 0908   BILITOT 0.3 10/29/2020 0908      Impression and Plan: Mr. Yamamoto is a very nice 79-year-old white male.  He has metastatic bronchogenic carcinoma.  This is a squamous cell cancer.  He underwent radiosurgery for the CNS metastasis.  We we will try his second cycle of immunotherapy.  I think immunotherapy will be the only way that we   will be able to treat him.  I know that he is on Decadron.  I know this might interfere with immunotherapy.  However, I do not think the dose of Decadron really is all that high so I do not  think we should have a lot of problem with respect to the effectiveness of the immunotherapy.  I just want him to feel better.  I want him to get stronger.  I hope that he will be able to eat better.  It is still too early to do any scans on him to see how things are working.    Again, I want to make sure that we focus on his quality of life as our primary goal.  We will plan to get her back in another 3 weeks or so and see how he is doing.    Peter R Ennever, MD 6/17/202211:13 AM 

## 2020-10-29 NOTE — Patient Instructions (Signed)
Implanted Port Home Guide An implanted port is a device that is placed under the skin. It is usually placed in the chest. The device can be used to give IV medicine, to take blood, or for dialysis. You may have an implanted port if: You need IV medicine that would be irritating to the small veins in your hands or arms. You need IV medicines, such as antibiotics, for a long period of time. You need IV nutrition for a long period of time. You need dialysis. When you have a port, your health care provider can choose to use the port instead of veins in your arms for these procedures. You may have fewer limitations when using a port than you would if you used other types of long-term IVs, and you will likely be able to return to normal activities afteryour incision heals. An implanted port has two main parts: Reservoir. The reservoir is the part where a needle is inserted to give medicines or draw blood. The reservoir is round. After it is placed, it appears as a small, raised area under your skin. Catheter. The catheter is a thin, flexible tube that connects the reservoir to a vein. Medicine that is inserted into the reservoir goes into the catheter and then into the vein. How is my port accessed? To access your port: A numbing cream may be placed on the skin over the port site. Your health care provider will put on a mask and sterile gloves. The skin over your port will be cleaned carefully with a germ-killing soap and allowed to dry. Your health care provider will gently pinch the port and insert a needle into it. Your health care provider will check for a blood return to make sure the port is in the vein and is not clogged. If your port needs to remain accessed to get medicine continuously (constant infusion), your health care provider will place a clear bandage (dressing) over the needle site. The dressing and needle will need to be changed every week, or as told by your health care provider. What  is flushing? Flushing helps keep the port from getting clogged. Follow instructions from your health care provider about how and when to flush the port. Ports are usually flushed with saline solution or a medicine called heparin. The need for flushing will depend on how the port is used: If the port is only used from time to time to give medicines or draw blood, the port may need to be flushed: Before and after medicines have been given. Before and after blood has been drawn. As part of routine maintenance. Flushing may be recommended every 4-6 weeks. If a constant infusion is running, the port may not need to be flushed. Throw away any syringes in a disposal container that is meant for sharp items (sharps container). You can buy a sharps container from a pharmacy, or you can make one by using an empty hard plastic bottle with a cover. How long will my port stay implanted? The port can stay in for as long as your health care provider thinks it is needed. When it is time for the port to come out, a surgery will be done to remove it. The surgery will be similar to the procedure that was done to putthe port in. Follow these instructions at home:  Flush your port as told by your health care provider. If you need an infusion over several days, follow instructions from your health care provider about how to take   care of your port site. Make sure you: Wash your hands with soap and water before you change your dressing. If soap and water are not available, use alcohol-based hand sanitizer. Change your dressing as told by your health care provider. Place any used dressings or infusion bags into a plastic bag. Throw that bag in the trash. Keep the dressing that covers the needle clean and dry. Do not get it wet. Do not use scissors or sharp objects near the tube. Keep the tube clamped, unless it is being used. Check your port site every day for signs of infection. Check for: Redness, swelling, or  pain. Fluid or blood. Pus or a bad smell. Protect the skin around the port site. Avoid wearing bra straps that rub or irritate the site. Protect the skin around your port from seat belts. Place a soft pad over your chest if needed. Bathe or shower as told by your health care provider. The site may get wet as long as you are not actively receiving an infusion. Return to your normal activities as told by your health care provider. Ask your health care provider what activities are safe for you. Carry a medical alert card or wear a medical alert bracelet at all times. This will let health care providers know that you have an implanted port in case of an emergency. Get help right away if: You have redness, swelling, or pain at the port site. You have fluid or blood coming from your port site. You have pus or a bad smell coming from the port site. You have a fever. Summary Implanted ports are usually placed in the chest for long-term IV access. Follow instructions from your health care provider about flushing the port and changing bandages (dressings). Take care of the area around your port by avoiding clothing that puts pressure on the area, and by watching for signs of infection. Protect the skin around your port from seat belts. Place a soft pad over your chest if needed. Get help right away if you have a fever or you have redness, swelling, pain, drainage, or a bad smell at the port site. This information is not intended to replace advice given to you by your health care provider. Make sure you discuss any questions you have with your healthcare provider. Document Revised: 09/15/2019 Document Reviewed: 09/15/2019 Elsevier Patient Education  2022 Elsevier Inc.  

## 2020-10-29 NOTE — Telephone Encounter (Signed)
Per 10/29/20 los Called and spoke to patient's wife - gave upcoming appointments - patient's wife confirmed

## 2020-10-30 LAB — T4: T4, Total: 5.9 ug/dL (ref 4.5–12.0)

## 2020-11-01 ENCOUNTER — Encounter: Payer: Self-pay | Admitting: Hematology & Oncology

## 2020-11-01 NOTE — Progress Notes (Signed)
Oncology Nurse Navigator Documentation  Oncology Nurse Navigator Flowsheets 10/29/2020  Abnormal Finding Date -  Confirmed Diagnosis Date -  Diagnosis Status -  Phase of Treatment -  Chemotherapy Actual Start Date: -  Chemotherapy Expected End Date: -  Radiation Actual Start Date: -  Radiation Actual End Date: -  Navigator Follow Up Date: 11/23/2020  Navigator Follow Up Reason: Follow-up Appointment;Chemotherapy  Navigator Location [facility]  Referral Date to RadOnc/MedOnc -  Navigator Encounter Type Treatment;Appt/Treatment Plan Review  Telephone -  Treatment Initiated Date -  Patient Visit Type MedOnc  Treatment Phase Active Tx  Barriers/Navigation Needs Coordination of Care;Education  Education -  Interventions Psycho-Social Support  Acuity Level 2-Minimal Needs (1-2 Barriers Identified)  Coordination of Care -  Education Method -  Support Groups/Services Friends and Family  Time Spent with Patient 15

## 2020-11-05 ENCOUNTER — Telehealth: Payer: Self-pay | Admitting: *Deleted

## 2020-11-05 NOTE — Telephone Encounter (Signed)
Received a voice mail message from wife stating,"Bryce Powell is having diarrhea and I don't know what to do. My return number is [phone]." This nurse tried calling the number but it rang busy.

## 2020-11-23 ENCOUNTER — Inpatient Hospital Stay (HOSPITAL_BASED_OUTPATIENT_CLINIC_OR_DEPARTMENT_OTHER): Payer: Medicare Other | Admitting: Hematology & Oncology

## 2020-11-23 ENCOUNTER — Telehealth: Payer: Self-pay

## 2020-11-23 ENCOUNTER — Inpatient Hospital Stay: Payer: Medicare Other

## 2020-11-23 ENCOUNTER — Encounter: Payer: Self-pay | Admitting: *Deleted

## 2020-11-23 ENCOUNTER — Inpatient Hospital Stay: Payer: Medicare Other | Attending: Hematology & Oncology

## 2020-11-23 ENCOUNTER — Other Ambulatory Visit: Payer: Self-pay

## 2020-11-23 ENCOUNTER — Encounter: Payer: Self-pay | Admitting: Hematology & Oncology

## 2020-11-23 VITALS — BP 121/64 | HR 104 | Temp 98.7°F | Resp 17 | Ht 72.0 in

## 2020-11-23 DIAGNOSIS — E119 Type 2 diabetes mellitus without complications: Secondary | ICD-10-CM | POA: Diagnosis not present

## 2020-11-23 DIAGNOSIS — C7931 Secondary malignant neoplasm of brain: Secondary | ICD-10-CM | POA: Diagnosis not present

## 2020-11-23 DIAGNOSIS — E139 Other specified diabetes mellitus without complications: Secondary | ICD-10-CM

## 2020-11-23 DIAGNOSIS — C3490 Malignant neoplasm of unspecified part of unspecified bronchus or lung: Secondary | ICD-10-CM

## 2020-11-23 DIAGNOSIS — Z79899 Other long term (current) drug therapy: Secondary | ICD-10-CM | POA: Diagnosis not present

## 2020-11-23 DIAGNOSIS — C3411 Malignant neoplasm of upper lobe, right bronchus or lung: Secondary | ICD-10-CM | POA: Insufficient documentation

## 2020-11-23 DIAGNOSIS — C779 Secondary and unspecified malignant neoplasm of lymph node, unspecified: Secondary | ICD-10-CM | POA: Insufficient documentation

## 2020-11-23 DIAGNOSIS — Z5112 Encounter for antineoplastic immunotherapy: Secondary | ICD-10-CM | POA: Diagnosis present

## 2020-11-23 DIAGNOSIS — Z794 Long term (current) use of insulin: Secondary | ICD-10-CM | POA: Insufficient documentation

## 2020-11-23 DIAGNOSIS — F17210 Nicotine dependence, cigarettes, uncomplicated: Secondary | ICD-10-CM | POA: Insufficient documentation

## 2020-11-23 LAB — CBC WITH DIFFERENTIAL (CANCER CENTER ONLY)
Abs Immature Granulocytes: 0.3 10*3/uL — ABNORMAL HIGH (ref 0.00–0.07)
Basophils Absolute: 0 10*3/uL (ref 0.0–0.1)
Basophils Relative: 0 %
Eosinophils Absolute: 0 10*3/uL (ref 0.0–0.5)
Eosinophils Relative: 0 %
HCT: 38.6 % — ABNORMAL LOW (ref 39.0–52.0)
Hemoglobin: 12.9 g/dL — ABNORMAL LOW (ref 13.0–17.0)
Immature Granulocytes: 2 %
Lymphocytes Relative: 10 %
Lymphs Abs: 1.6 10*3/uL (ref 0.7–4.0)
MCH: 29.9 pg (ref 26.0–34.0)
MCHC: 33.4 g/dL (ref 30.0–36.0)
MCV: 89.4 fL (ref 80.0–100.0)
Monocytes Absolute: 0.7 10*3/uL (ref 0.1–1.0)
Monocytes Relative: 5 %
Neutro Abs: 13 10*3/uL — ABNORMAL HIGH (ref 1.7–7.7)
Neutrophils Relative %: 83 %
Platelet Count: 235 10*3/uL (ref 150–400)
RBC: 4.32 MIL/uL (ref 4.22–5.81)
RDW: 16 % — ABNORMAL HIGH (ref 11.5–15.5)
WBC Count: 15.6 10*3/uL — ABNORMAL HIGH (ref 4.0–10.5)
nRBC: 0 % (ref 0.0–0.2)

## 2020-11-23 LAB — CMP (CANCER CENTER ONLY)
ALT: 32 U/L (ref 0–44)
AST: 11 U/L — ABNORMAL LOW (ref 15–41)
Albumin: 3.1 g/dL — ABNORMAL LOW (ref 3.5–5.0)
Alkaline Phosphatase: 84 U/L (ref 38–126)
Anion gap: 9 (ref 5–15)
BUN: 28 mg/dL — ABNORMAL HIGH (ref 8–23)
CO2: 27 mmol/L (ref 22–32)
Calcium: 9.2 mg/dL (ref 8.9–10.3)
Chloride: 99 mmol/L (ref 98–111)
Creatinine: 0.67 mg/dL (ref 0.61–1.24)
GFR, Estimated: >60 mL/min (ref >60)
Glucose, Bld: 410 mg/dL — ABNORMAL HIGH (ref 70–99)
Potassium: 3.8 mmol/L (ref 3.5–5.1)
Sodium: 135 mmol/L (ref 135–145)
Total Bilirubin: 0.3 mg/dL (ref 0.3–1.2)
Total Protein: 5.9 g/dL — ABNORMAL LOW (ref 6.5–8.1)

## 2020-11-23 LAB — TSH: TSH: 1.028 u[IU]/mL (ref 0.320–4.118)

## 2020-11-23 LAB — PREALBUMIN: Prealbumin: 21.1 mg/dL (ref 18–38)

## 2020-11-23 LAB — LACTATE DEHYDROGENASE: LDH: 233 U/L — ABNORMAL HIGH (ref 98–192)

## 2020-11-23 MED ORDER — NICOTINE 21 MG/24HR TD PT24: 21.0000 mg | MEDICATED_PATCH | Freq: Every day | TRANSDERMAL | 1 refills | Status: AC

## 2020-11-23 MED ORDER — SODIUM CHLORIDE 0.9 % IV SOLN
1.0000 mg/kg | Freq: Once | INTRAVENOUS | Status: AC
  Administered 2020-11-23: 90 mg via INTRAVENOUS
  Filled 2020-11-23: qty 18

## 2020-11-23 MED ORDER — SODIUM CHLORIDE 0.9 % IV SOLN
Freq: Once | INTRAVENOUS | Status: AC
Start: 2020-11-23 — End: 2020-11-23
  Filled 2020-11-23: qty 250

## 2020-11-23 MED ORDER — FAMOTIDINE 20 MG IN NS 100 ML IVPB
20.0000 mg | Freq: Two times a day (BID) | INTRAVENOUS | Status: AC
  Administered 2020-11-23: 20 mg via INTRAVENOUS
  Filled 2020-11-23: qty 100

## 2020-11-23 MED ORDER — SODIUM CHLORIDE 0.9% FLUSH
10.0000 mL | INTRAVENOUS | Status: AC | PRN
  Administered 2020-11-23: 10 mL
  Filled 2020-11-23: qty 10

## 2020-11-23 MED ORDER — DIPHENHYDRAMINE HCL 50 MG/ML IJ SOLN
25.0000 mg | Freq: Once | INTRAMUSCULAR | Status: AC
  Administered 2020-11-23: 25 mg via INTRAVENOUS

## 2020-11-23 MED ORDER — DIPHENHYDRAMINE HCL 50 MG/ML IJ SOLN
INTRAMUSCULAR | Status: AC
  Filled 2020-11-23: qty 1

## 2020-11-23 MED ORDER — HEPARIN SOD (PORK) LOCK FLUSH 100 UNIT/ML IV SOLN
500.0000 [IU] | Freq: Once | INTRAVENOUS | Status: AC | PRN
Start: 2020-11-23 — End: 2020-11-23
  Administered 2020-11-23: 500 [IU]
  Filled 2020-11-23: qty 5

## 2020-11-23 MED ORDER — SODIUM CHLORIDE 0.9 % IV SOLN
2.7300 mg/kg | Freq: Once | INTRAVENOUS | Status: AC
  Administered 2020-11-23: 240 mg via INTRAVENOUS
  Filled 2020-11-23: qty 24

## 2020-11-23 MED ORDER — INSULIN ASPART 100 UNIT/ML IJ SOLN
20.0000 [IU] | Freq: Once | INTRAMUSCULAR | Status: AC
Start: 2020-11-23 — End: 2020-11-23
  Administered 2020-11-23: 20 [IU] via SUBCUTANEOUS
  Filled 2020-11-23: qty 0.2

## 2020-11-23 NOTE — Patient Instructions (Signed)
Implanted Port Home Guide An implanted port is a device that is placed under the skin. It is usually placed in the chest. The device can be used to give IV medicine, to take blood, or for dialysis. You may have an implanted port if: You need IV medicine that would be irritating to the small veins in your hands or arms. You need IV medicines, such as antibiotics, for a long period of time. You need IV nutrition for a long period of time. You need dialysis. When you have a port, your health care provider can choose to use the port instead of veins in your arms for these procedures. You may have fewer limitations when using a port than you would if you used other types of long-term IVs, and you will likely be able to return to normal activities afteryour incision heals. An implanted port has two main parts: Reservoir. The reservoir is the part where a needle is inserted to give medicines or draw blood. The reservoir is round. After it is placed, it appears as a small, raised area under your skin. Catheter. The catheter is a thin, flexible tube that connects the reservoir to a vein. Medicine that is inserted into the reservoir goes into the catheter and then into the vein. How is my port accessed? To access your port: A numbing cream may be placed on the skin over the port site. Your health care provider will put on a mask and sterile gloves. The skin over your port will be cleaned carefully with a germ-killing soap and allowed to dry. Your health care provider will gently pinch the port and insert a needle into it. Your health care provider will check for a blood return to make sure the port is in the vein and is not clogged. If your port needs to remain accessed to get medicine continuously (constant infusion), your health care provider will place a clear bandage (dressing) over the needle site. The dressing and needle will need to be changed every week, or as told by your health care provider. What  is flushing? Flushing helps keep the port from getting clogged. Follow instructions from your health care provider about how and when to flush the port. Ports are usually flushed with saline solution or a medicine called heparin. The need for flushing will depend on how the port is used: If the port is only used from time to time to give medicines or draw blood, the port may need to be flushed: Before and after medicines have been given. Before and after blood has been drawn. As part of routine maintenance. Flushing may be recommended every 4-6 weeks. If a constant infusion is running, the port may not need to be flushed. Throw away any syringes in a disposal container that is meant for sharp items (sharps container). You can buy a sharps container from a pharmacy, or you can make one by using an empty hard plastic bottle with a cover. How long will my port stay implanted? The port can stay in for as long as your health care provider thinks it is needed. When it is time for the port to come out, a surgery will be done to remove it. The surgery will be similar to the procedure that was done to putthe port in. Follow these instructions at home:  Flush your port as told by your health care provider. If you need an infusion over several days, follow instructions from your health care provider about how to take   care of your port site. Make sure you: Wash your hands with soap and water before you change your dressing. If soap and water are not available, use alcohol-based hand sanitizer. Change your dressing as told by your health care provider. Place any used dressings or infusion bags into a plastic bag. Throw that bag in the trash. Keep the dressing that covers the needle clean and dry. Do not get it wet. Do not use scissors or sharp objects near the tube. Keep the tube clamped, unless it is being used. Check your port site every day for signs of infection. Check for: Redness, swelling, or  pain. Fluid or blood. Pus or a bad smell. Protect the skin around the port site. Avoid wearing bra straps that rub or irritate the site. Protect the skin around your port from seat belts. Place a soft pad over your chest if needed. Bathe or shower as told by your health care provider. The site may get wet as long as you are not actively receiving an infusion. Return to your normal activities as told by your health care provider. Ask your health care provider what activities are safe for you. Carry a medical alert card or wear a medical alert bracelet at all times. This will let health care providers know that you have an implanted port in case of an emergency. Get help right away if: You have redness, swelling, or pain at the port site. You have fluid or blood coming from your port site. You have pus or a bad smell coming from the port site. You have a fever. Summary Implanted ports are usually placed in the chest for long-term IV access. Follow instructions from your health care provider about flushing the port and changing bandages (dressings). Take care of the area around your port by avoiding clothing that puts pressure on the area, and by watching for signs of infection. Protect the skin around your port from seat belts. Place a soft pad over your chest if needed. Get help right away if you have a fever or you have redness, swelling, pain, drainage, or a bad smell at the port site. This information is not intended to replace advice given to you by your health care provider. Make sure you discuss any questions you have with your healthcare provider. Document Revised: 09/15/2019 Document Reviewed: 09/15/2019 Elsevier Patient Education  2022 Elsevier Inc.  

## 2020-11-23 NOTE — Patient Instructions (Signed)
[facility] [place]  Discharge Instructions: Thank you for choosing [facility] to provide your oncology and hematology care.   If you have a lab appointment with the [facility], please go directly to the [facility] and check in at the registration area.  Wear comfortable clothing and clothing appropriate for easy access to any Portacath or PICC line.   We strive to give you quality time with your provider. You may need to reschedule your appointment if you arrive late (15 or more minutes).  Arriving late affects you and other patients whose appointments are after yours.  Also, if you miss three or more appointments without notifying the office, you may be dismissed from the clinic at the provider's discretion.      For prescription refill requests, have your pharmacy contact our office and allow 72 hours for refills to be completed.    Today you received the following chemotherapy and/or immunotherapy agents Opdivo/Yervoy      To help prevent nausea and vomiting after your treatment, we encourage you to take your nausea medication as directed.  BELOW ARE SYMPTOMS THAT SHOULD BE REPORTED IMMEDIATELY: *FEVER GREATER THAN 100.4 F (38 C) OR HIGHER *CHILLS OR SWEATING *NAUSEA AND VOMITING THAT IS NOT CONTROLLED WITH YOUR NAUSEA MEDICATION *UNUSUAL SHORTNESS OF BREATH *UNUSUAL BRUISING OR BLEEDING *URINARY PROBLEMS (pain or burning when urinating, or frequent urination) *BOWEL PROBLEMS (unusual diarrhea, constipation, pain near the anus) TENDERNESS IN MOUTH AND THROAT WITH OR WITHOUT PRESENCE OF ULCERS (sore throat, sores in mouth, or a toothache) UNUSUAL RASH, SWELLING OR PAIN  UNUSUAL VAGINAL DISCHARGE OR ITCHING   Items with * indicate a potential emergency and should be followed up as soon as possible or go to the Emergency Department if any problems should occur.  Please show the CHEMOTHERAPY ALERT CARD or IMMUNOTHERAPY ALERT CARD at check-in to  the Emergency Department and triage nurse. Should you have questions after your visit or need to cancel or reschedule your appointment, please contact [facility]  [phone] and follow the prompts.  Office hours are 8:00 a.m. to 4:30 p.m. Monday - Friday. Please note that voicemails left after 4:00 p.m. may not be returned until the following business day.  We are closed weekends and major holidays. You have access to a nurse at all times for urgent questions. Please call the main number to the clinic [phone] and follow the prompts.  For any non-urgent questions, you may also contact your provider using MyChart. We now offer e-Visits for anyone 50 and older to request care online for non-urgent symptoms. For details visit [email or web address].   Also download the MyChart app! Go to the app store, search "MyChart", open the app, select [facility], and log in with your MyChart username and password.  Due to Covid, a mask is required upon entering the hospital/clinic. If you do not have a mask, one will be given to you upon arrival. For doctor visits, patients may have 1 support person aged 73 or older with them. For treatment visits, patients cannot have anyone with them due to current Covid guidelines and our immunocompromised population.

## 2020-11-23 NOTE — Progress Notes (Signed)
Patient will need a CT scan prior to his next appt/treatment. CT scheduled at [facility].   Reviewed scan with patient. Gave him instructions both verbal and written. Contrast provided. Also called his wife and reviewed same information. She know patient will bring home contrast with written instructions.   Oncology Nurse Navigator Documentation  Oncology Nurse Navigator Flowsheets 11/23/2020  Abnormal Finding Date -  Confirmed Diagnosis Date -  Diagnosis Status -  Phase of Treatment -  Chemotherapy Actual Start Date: -  Chemotherapy Expected End Date: -  Radiation Actual Start Date: -  Radiation Actual End Date: -  Navigator Follow Up Date: 12/15/2020  Navigator Follow Up Reason: Scan Review  Navigator Location [facility]  Referral Date to RadOnc/MedOnc -  Navigator Encounter Type Treatment;Appt/Treatment Plan Review;Telephone  Telephone Outgoing Call;Education  Treatment Initiated Date -  Patient Visit Type MedOnc  Treatment Phase Active Tx  Barriers/Navigation Needs Coordination of Care;Education  Education Other  Interventions Coordination of Care;Education;Psycho-Social Support  Acuity Level 2-Minimal Needs (1-2 Barriers Identified)  Coordination of Care Appts;Radiology  Education Method Verbal;Written;Teach-back  Support Groups/Services Friends and Family  Time Spent with Patient 38

## 2020-11-23 NOTE — Progress Notes (Signed)
Hematology and Oncology Follow Up Visit  Bryce Powell [id number] 07-18-1940 80 y.o. 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 Cowsert is in for follow-up.  There really is hard to say how much we really helping him.  According to his wife, he just not doing well at home. He is not walking that much.  She has to get him up every day.  He is quite big.  She is having more of a difficult time trying to get him around.  He is still smoking.  He says he smokes a pack a day.  I told him that if he continues to smoke, then treatment will not work.  His blood sugar is horrible today.  His blood sugar is 410.  Again, I just am not sure as to how much we will really help him out in the long run.  He also had diarrhea about a week ago.  She did not give anything for this.  It lasted for about 2 days.  I told him that we will do another CT scan after this treatment.  If the cancer is worse, then we will definitely think about getting Hospice involved.  It does seem as if his quality life is not that wonderful.  We have a disease that is not curable by any means.  Again, we really have to think about his quality of life and overall comfort.  According to his wife, he is eating a little bit better.  He is having no problems with nausea or vomiting.  There is no bleeding.  Currently, his performance status is ECOG 2.  Medications:  Current Outpatient Medications:    acetaminophen (TYLENOL) 500 MG tablet, Take 2 tablets (1,000 mg total) by mouth every 6 (six) hours. (Patient taking differently: Take 500 mg by mouth every 6 (six) hours as needed for moderate pain.), Disp: 30 tablet, Rfl: 0   dexamethasone (DECADRON) 4 MG tablet, Take 1 tablet (4 mg total)  by mouth 3 (three) times daily., Disp: 90 tablet, Rfl: 2   esomeprazole (NEXIUM) 20 MG capsule, Take 20 mg by mouth daily., Disp: , Rfl:    insulin glargine (LANTUS) 100 UNIT/ML Solostar Pen, Inject 14 Units into the skin daily. (Patient taking differently: Inject 20 Units into the skin daily.), Disp: 15 mL, Rfl: 0   insulin lispro (HUMALOG KWIKPEN) 100 UNIT/ML KwikPen, Inject 5 Units into the skin 3 (three) times daily. (Patient taking differently: Inject 8 Units into the skin 3 (three) times daily.), Disp: 15 mL, Rfl: 0   losartan (COZAAR) 50 MG tablet, Take 50 mg daily by mouth., Disp: , Rfl:    Multiple Vitamin (MULTIVITAMIN WITH MINERALS) TABS tablet, Take 1 tablet by mouth daily., Disp: , Rfl:    nicotine (NICODERM CQ - DOSED IN MG/24 HOURS) 21 mg/24hr patch, Place 1 patch (21 mg total) onto the skin daily., Disp: 28 patch, Rfl: 1   RELION PEN NEEDLE 31G/8MM 31G X 8 MM MISC, USE AS DIRECTED FIVE TIMES DAILY, Disp: , Rfl:    allopurinol (ZYLOPRIM) 300 MG tablet, Take 1 tablet (300 mg total) by mouth daily. (Patient not taking: No sig reported), Disp: 30 tablet, Rfl: 5   levETIRAcetam (KEPPRA) 500 MG tablet, Take 1 tablet (500  mg total) by mouth 2 (two) times daily., Disp: 60 tablet, Rfl: 0   nitroGLYCERIN (NITROSTAT) 0.4 MG SL tablet, Place 1 tablet (0.4 mg total) under the tongue every 5 (five) minutes as needed for chest pain., Disp: 25 tablet, Rfl: 3   rosuvastatin (CRESTOR) 5 MG tablet, TAKE 1 TABLET BY MOUTH  DAILY (Patient not taking: No sig reported), Disp: 90 tablet, Rfl: 1 No current facility-administered medications for this visit.  Facility-Administered Medications Ordered in Other Visits:    famotidine (PEPCID) IVPB 20 mg in NS 100 mL IVPB, 20 mg, Intravenous, Q12H, Doneisha Ivey, Rudell Cobb, MD, Last Rate: 200 mL/hr at 11/23/20 1037, 20 mg at 11/23/20 1037   heparin lock flush 100 unit/mL, 500 Units, Intracatheter, Once PRN, Thanh Pomerleau, Rudell Cobb, MD   ipilimumab (YERVOY) 90 mg in sodium  chloride 0.9 % 50 mL chemo infusion, 1 mg/kg (Treatment Plan Recorded), Intravenous, Once, Maaliyah Adolph, Rudell Cobb, MD   nivolumab (OPDIVO) 240 mg in sodium chloride 0.9 % 100 mL chemo infusion, 2.73 mg/kg (Treatment Plan Recorded), Intravenous, Once, Peggi Yono, Rudell Cobb, MD   sodium chloride flush (NS) 0.9 % injection 10 mL, 10 mL, Intracatheter, PRN, Volanda Napoleon, MD  Allergies:  Allergies  Allergen Reactions   Atorvastatin     MUSCLE ACHES     Past Medical History, Surgical history, Social history, and Family History were reviewed and updated.  Review of Systems: Review of Systems  Constitutional: Negative.   HENT:  Negative.    Eyes: Negative.   Respiratory: Negative.    Cardiovascular: Negative.   Gastrointestinal: Negative.   Endocrine: Negative.   Genitourinary: Negative.    Musculoskeletal: Negative.   Skin: Negative.   Neurological: Negative.   Hematological: Negative.   Psychiatric/Behavioral: Negative.     Physical Exam:  height is 6' (1.829 m). His oral temperature is 98.7 F (37.1 C). His blood pressure is 121/64 and his pulse is 104 (abnormal). His respiration is 17 and oxygen saturation is 100%.   Wt Readings from Last 3 Encounters:  10/10/20 200 lb (90.7 kg)  09/29/20 194 lb (88 kg)  09/10/20 190 lb 6 oz (86.4 kg)    Physical Exam Vitals reviewed.  HENT:     Head: Normocephalic and atraumatic.  Eyes:     Pupils: Pupils are equal, round, and reactive to light.  Cardiovascular:     Rate and Rhythm: Normal rate and regular rhythm.     Heart sounds: Normal heart sounds.  Pulmonary:     Effort: Pulmonary effort is normal.     Breath sounds: Normal breath sounds.  Abdominal:     General: Bowel sounds are normal.     Palpations: Abdomen is soft.  Musculoskeletal:        General: No tenderness or deformity. Normal range of motion.     Cervical back: Normal range of motion.  Lymphadenopathy:     Cervical: No cervical adenopathy.  Skin:    General: Skin is  warm and dry.     Findings: No erythema or rash.  Neurological:     Mental Status: He is alert and oriented to person, place, and time.  Psychiatric:        Behavior: Behavior normal.        Thought Content: Thought content normal.        Judgment: Judgment normal.    Lab Results  Component Value Date   WBC 15.6 (H) 11/23/2020   HGB 12.9 (L) 11/23/2020   HCT 38.6 (L) 11/23/2020  MCV 89.4 11/23/2020   PLT 235 11/23/2020     Chemistry      Component Value Date/Time   NA 135 11/23/2020 0842   NA 139 04/27/2017 0857   K 3.8 11/23/2020 0842   CL 99 11/23/2020 0842   CO2 27 11/23/2020 0842   BUN 28 (H) 11/23/2020 0842   BUN 24 04/27/2017 0857   CREATININE 0.67 11/23/2020 0842      Component Value Date/Time   CALCIUM 9.2 11/23/2020 0842   ALKPHOS 84 11/23/2020 0842   AST 11 (L) 11/23/2020 0842   ALT 32 11/23/2020 0842   BILITOT 0.3 11/23/2020 0842      Impression and Plan: Mr. Levinson is a very nice 80 year old white male.  He has metastatic bronchogenic carcinoma.  This is a squamous cell cancer.  He underwent radiosurgery for the CNS metastasis.  This will be his third cycle of chemotherapy.  Again, if his CT scan is worse, that we will stop treatment on him.  He is on Decadron.  This probably is helping his appetite.  However, it also is jacking up his blood sugars.  I told his wife to decrease Decadron to 4 mg twice a day.  We will give him some insulin in the office.  I will give him 20 units of insulin.  I told his wife that if he has diarrhea to give him some Pepto-Bismol.  Again, I really want to make sure his quality of life is our focus.  If the CT scan is worse, then we will clearly get hospice going.  I will plan for the CT scan to be done in a couple weeks or so.  We have I will see him back in another 3 or 4 weeks.    Volanda Napoleon, MD 7/12/202211:36 AM

## 2020-11-23 NOTE — Progress Notes (Signed)
Patient has had weight gain. Leave nivolumab dose 240 mg today per Dr. Marin Olp.

## 2020-11-23 NOTE — Progress Notes (Unsigned)
Ok to treat with pulse 104 per Dr. Marin Olp

## 2020-11-24 LAB — T4: T4, Total: 4.2 ug/dL — ABNORMAL LOW (ref 4.5–12.0)

## 2020-12-03 ENCOUNTER — Emergency Department (HOSPITAL_COMMUNITY)
Admission: EM | Admit: 2020-12-03 | Discharge: 2020-12-03 | Disposition: A | Discharge status: Home / Self Care | Payer: Medicare Other | Attending: Emergency Medicine | Admitting: Emergency Medicine

## 2020-12-03 ENCOUNTER — Encounter (HOSPITAL_COMMUNITY): Payer: Self-pay

## 2020-12-03 ENCOUNTER — Other Ambulatory Visit: Payer: Self-pay

## 2020-12-03 DIAGNOSIS — E109 Type 1 diabetes mellitus without complications: Secondary | ICD-10-CM | POA: Diagnosis not present

## 2020-12-03 DIAGNOSIS — I959 Hypotension, unspecified: Secondary | ICD-10-CM | POA: Insufficient documentation

## 2020-12-03 DIAGNOSIS — Z79899 Other long term (current) drug therapy: Secondary | ICD-10-CM | POA: Diagnosis not present

## 2020-12-03 DIAGNOSIS — Z85841 Personal history of malignant neoplasm of brain: Secondary | ICD-10-CM | POA: Diagnosis not present

## 2020-12-03 DIAGNOSIS — F17210 Nicotine dependence, cigarettes, uncomplicated: Secondary | ICD-10-CM | POA: Diagnosis not present

## 2020-12-03 DIAGNOSIS — I10 Essential (primary) hypertension: Secondary | ICD-10-CM | POA: Insufficient documentation

## 2020-12-03 DIAGNOSIS — E860 Dehydration: Secondary | ICD-10-CM | POA: Diagnosis not present

## 2020-12-03 DIAGNOSIS — Z85118 Personal history of other malignant neoplasm of bronchus and lung: Secondary | ICD-10-CM | POA: Diagnosis not present

## 2020-12-03 DIAGNOSIS — R531 Weakness: Secondary | ICD-10-CM | POA: Diagnosis present

## 2020-12-03 DIAGNOSIS — Z951 Presence of aortocoronary bypass graft: Secondary | ICD-10-CM | POA: Insufficient documentation

## 2020-12-03 HISTORY — DX: Malignant (primary) neoplasm, unspecified: C80.1

## 2020-12-03 LAB — CBG MONITORING, ED: Glucose-Capillary: 212 mg/dL — ABNORMAL HIGH (ref 70–99)

## 2020-12-03 MED ORDER — SODIUM CHLORIDE 0.9 % IV BOLUS
500.0000 mL | Freq: Once | INTRAVENOUS | Status: AC
  Administered 2020-12-03: 500 mL via INTRAVENOUS

## 2020-12-03 NOTE — Discharge Instructions (Addendum)
Follow-up with your doctor as planned 

## 2020-12-03 NOTE — ED Triage Notes (Signed)
EMS states that they were called out by the home health nurse for a low b/p.  They state that the pt has no complaints and they found the pt to be normotensive .

## 2020-12-03 NOTE — ED Provider Notes (Signed)
[facility] Provider Note   CSN: [id number] Arrival date & time: 12/03/20  1703     History Chief Complaint  Patient presents with   Weakness    Bryce Powell is a 80 y.o. male.  According to his caregiver the patient was found to have a low blood pressure.  He has no complaints.  When the paramedics got there his blood pressure was fine and it has been fine since.  Patient has a history of metastatic cancer  The history is provided by the patient, medical records and a relative. No language interpreter was used.  Weakness Severity:  Mild Onset quality:  Sudden Timing:  Intermittent Progression:  Waxing and waning Chronicity:  Recurrent Context: not alcohol use   Relieved by:  Nothing Worsened by:  Nothing Ineffective treatments:  None tried Associated symptoms: no abdominal pain, no chest pain, no cough, no diarrhea, no frequency, no headaches and no seizures       Past Medical History:  Diagnosis Date   Cancer ([place])    Current smoker    Diabetes ([place])    Goals of care, counseling/discussion 09/29/2020   HTN (hypertension)    PAF (paroxysmal atrial fibrillation) ([place])    post CABG   RBBB 04/27/2017   New   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   LIMA to LAD, SVG to LPL2, SVG to OM1, EVH via right thigh and leg   Sleep apnea    Tobacco abuse     Patient Active Problem List   Diagnosis Date Noted   Goals of care, counseling/discussion 09/29/2020   Lung cancer metastatic to brain [facility])    Brain edema ([place]) 09/03/2020   Diabetes ([place])    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   Tobacco abuse    NSTEMI (non-ST elevated myocardial infarction) ([place]) 03/28/2017   HTN (hypertension) 03/28/2017   Diabetes 1.5, 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   Angina at rest [facility]) 03/28/2017    Past Surgical History:  Procedure Laterality Date   BRONCHIAL BIOPSY  09/06/2020   Procedure: BRONCHIAL BIOPSIES;  Surgeon: Rigoberto Noel, MD;  Location: [facility];  Service:  Cardiopulmonary;;   BRONCHIAL BRUSHINGS  09/06/2020   Procedure: BRONCHIAL BRUSHINGS;  Surgeon: Rigoberto Noel, MD;  Location: [facility];  Service: Cardiopulmonary;;   BRONCHIAL NEEDLE ASPIRATION BIOPSY  09/06/2020   Procedure: BRONCHIAL NEEDLE ASPIRATION BIOPSIES;  Surgeon: Rigoberto Noel, MD;  Location: [facility];  Service: Cardiopulmonary;;   BRONCHIAL WASHINGS  09/06/2020   Procedure: BRONCHIAL WASHINGS;  Surgeon: Rigoberto Noel, MD;  [facility]: [facility];  Service: Cardiopulmonary;;  BAL   CORONARY ARTERY BYPASS GRAFT N/A 03/30/2017   Procedure: CORONARY ARTERY BYPASS GRAFTING (CABG) x Three, using left internal mammary artery and right leg greater saphenous vein harvested endoscopically;  Surgeon: Rexene Alberts, MD;  Location: [facility];  Service: Open Heart Surgery;  Laterality: N/A;   ENDOBRONCHIAL ULTRASOUND Bilateral 09/06/2020   Procedure: ENDOBRONCHIAL ULTRASOUND;  Surgeon: Rigoberto Noel, MD;  [facility];  Service: Cardiopulmonary;  Laterality: Bilateral;   IR IMAGING GUIDED PORT INSERTION  10/04/2020   LEFT HEART CATH AND CORONARY ANGIOGRAPHY N/A 03/29/2017   Procedure: LEFT HEART CATH AND CORONARY ANGIOGRAPHY;  Surgeon: Lorretta Harp, MD;  Location: [facility];  Service: Cardiovascular;  Laterality: N/A;   TEE WITHOUT CARDIOVERSION N/A 03/30/2017   Procedure: TRANSESOPHAGEAL ECHOCARDIOGRAM (TEE);  Surgeon: Rexene Alberts, MD;  Location: [facility];  Service: Open Heart Surgery;  Laterality: N/A;   VIDEO BRONCHOSCOPY N/A 09/06/2020   Procedure: VIDEO BRONCHOSCOPY WITHOUT FLUORO;  Surgeon: Rigoberto Noel, MD;  [facility];  Service: Cardiopulmonary;  Laterality: N/A;       Family History  Problem Relation Age of Onset   Diabetes Mother    Heart attack Mother    Heart attack Father     Social History   Tobacco Use   Smoking status: Some Days    Packs/day: 1.00    Years: 60.00    Pack years: 60.00    Types: Cigarettes    Last attempt to  quit: 03/28/2017    Years since quitting: 3.6   Smokeless tobacco: Never   Tobacco comments:    Pt states , "I will probably quit now."  Vaping Use   Vaping Use: Never used  Substance Use Topics   Alcohol use: No   Drug use: No    Home Medications Prior to Admission medications   Medication Sig Start Date End Date Taking? Authorizing Provider  acetaminophen (TYLENOL) 500 MG tablet Take 2 tablets (1,000 mg total) by mouth every 6 (six) hours. Patient taking differently: Take 500 mg by mouth every 6 (six) hours as needed for moderate pain. 04/04/17  Yes Conte, Tessa N, PA-C  dexamethasone (DECADRON) 4 MG tablet Take 1 tablet (4 mg total) by mouth 3 (three) times daily. Patient taking differently: Take 4 mg by mouth daily. 10/26/20  Yes Ennever, Rudell Cobb, MD  insulin glargine (LANTUS) 100 UNIT/ML Solostar Pen Inject 14 Units into the skin daily. Patient taking differently: Inject 26 Units into the skin daily. 09/06/20  Yes Donne Hazel, MD  insulin lispro (HUMALOG KWIKPEN) 100 UNIT/ML KwikPen Inject 5 Units into the skin 3 (three) times daily. Patient taking differently: Inject 10 Units into the skin 3 (three) times daily. 09/06/20  Yes Donne Hazel, MD  levETIRAcetam (KEPPRA) 500 MG tablet Take 1 tablet (500 mg total) by mouth 2 (two) times daily. 09/06/20 12/03/20 Yes Donne Hazel, MD  losartan (COZAAR) 50 MG tablet Take 25 mg by mouth daily.   Yes [provider]  Multiple Vitamin (MULTIVITAMIN WITH MINERALS) TABS tablet Take 1 tablet by mouth daily.   Yes [provider]  allopurinol (ZYLOPRIM) 300 MG tablet Take 1 tablet (300 mg total) by mouth daily. Patient not taking: No sig reported 08/03/20   Sanjuana Kava, MD  amLODipine (NORVASC) 5 MG tablet Take 5 mg by mouth daily. Patient not taking: Reported on 12/03/2020 12/02/20   [provider]  esomeprazole (NEXIUM) 20 MG capsule Take 20 mg by mouth daily. Patient not taking: Reported on 12/03/2020 09/27/20    [provider]  glipiZIDE (GLUCOTROL XL) 10 MG 24 hr tablet Take 10 mg by mouth daily. Patient not taking: Reported on 12/03/2020 12/02/20   [provider]  HYDROcodone-acetaminophen (NORCO/VICODIN) 5-325 MG tablet Take 1 tablet by mouth 2 (two) times daily as needed. Patient not taking: Reported on 12/03/2020 10/13/20   [provider]  metFORMIN (GLUCOPHAGE) 1000 MG tablet Take 1,000 mg by mouth 2 (two) times daily. Patient not taking: Reported on 12/03/2020 12/02/20   [provider]  nicotine (NICODERM CQ - DOSED IN MG/24 HOURS) 21 mg/24hr patch Place 1 patch (21 mg total) onto the skin daily. Patient not taking: Reported on 12/03/2020 11/23/20   Volanda Napoleon, MD  nitroGLYCERIN (NITROSTAT) 0.4 MG SL tablet Place 1 tablet (0.4 mg total) under the tongue every 5 (five) minutes  as needed for chest pain. Patient not taking: Reported on 12/03/2020 06/12/17 12/03/20  Herminio Commons, MD  RELION PEN NEEDLE 31G/8MM 31G X 8 MM MISC USE AS DIRECTED FIVE TIMES DAILY 09/27/20   [provider]  rosuvastatin (CRESTOR) 5 MG tablet TAKE 1 TABLET BY MOUTH  DAILY Patient not taking: No sig reported 05/27/18   Herminio Commons, MD  traMADol (ULTRAM) 50 MG tablet Take 50 mg by mouth every 8 (eight) hours as needed. Patient not taking: Reported on 12/03/2020 11/11/20   [provider]    Allergies    Atorvastatin  Review of Systems   Review of Systems  Constitutional:  Negative for appetite change and fatigue.  HENT:  Negative for congestion, ear discharge and sinus pressure.   Eyes:  Negative for discharge.  Respiratory:  Negative for cough.   Cardiovascular:  Negative for chest pain.  Gastrointestinal:  Negative for abdominal pain and diarrhea.  Genitourinary:  Negative for frequency and hematuria.  Musculoskeletal:  Negative for back pain.  Skin:  Negative for rash.  Neurological:  Positive for weakness. Negative for seizures and headaches.   Psychiatric/Behavioral:  Negative for hallucinations.    Physical Exam Updated Vital Signs BP 125/70   Pulse 87   Temp 97.8 F (36.6 C) (Oral)   Resp (!) 22   Ht 6' (1.829 m)   Wt 90.7 kg   SpO2 99%   BMI 27.12 kg/m   Physical Exam Vitals and nursing note reviewed.  Constitutional:      Appearance: He is well-developed.  HENT:     Head: Normocephalic.     Nose: Nose normal.  Eyes:     General: No scleral icterus.    Conjunctiva/sclera: Conjunctivae normal.  Neck:     Thyroid: No thyromegaly.  Cardiovascular:     Rate and Rhythm: Normal rate and regular rhythm.     Heart sounds: No murmur heard.   No friction rub. No gallop.  Pulmonary:     Breath sounds: No stridor. No wheezing or rales.  Chest:     Chest wall: No tenderness.  Abdominal:     General: There is no distension.     Tenderness: There is no abdominal tenderness. There is no rebound.  Musculoskeletal:        General: Normal range of motion.     Cervical back: Neck supple.  Lymphadenopathy:     Cervical: No cervical adenopathy.  Skin:    Findings: No erythema or rash.  Neurological:     Mental Status: He is alert and oriented to person, place, and time.     Motor: No abnormal muscle tone.     Coordination: Coordination normal.  Psychiatric:        Behavior: Behavior normal.    ED Results / Procedures / Treatments   Labs (all labs ordered are listed, but only abnormal results are displayed) Labs Reviewed  CBG MONITORING, ED - Abnormal; Notable for the following components:      Result Value   Glucose-Capillary 212 (*)    All other components within normal limits    EKG None  Radiology No results found.  Procedures Procedures   Medications Ordered in ED Medications  sodium chloride 0.9 % bolus 500 mL (500 mLs Intravenous New Bag/Given 12/03/20 1800)    ED Course  I have reviewed the triage vital signs and the nursing notes.  Pertinent labs & imaging results that were available  during my care of the patient  were reviewed by me and considered in my medical decision making (see chart for details).    MDM Rules/Calculators/A&P                           Patient with metastatic cancer and dehydration.  He is given a liter of fluids and will go back home Final Clinical Impression(s) / ED Diagnoses Final diagnoses:  Dehydration    Rx / DC Orders ED Discharge Orders     None        Milton Ferguson, MD 12/06/20 1112

## 2020-12-08 ENCOUNTER — Encounter: Payer: Self-pay | Admitting: *Deleted

## 2020-12-08 NOTE — Progress Notes (Signed)
[name] RN [phone], from [facility] would like to have an order for a hospital bed for continued weakness of the patient. Verbal order given. Informed her to fax any orders that would need to be signed and she agreed.  She also mentions that family had decreased decadron to 4mg  daily as patient's blood sugars had increased. [name] hs a call into the PCP to increase his insulin coverage so that they can give the full decadron dose.   Oncology Nurse Navigator Documentation  Oncology Nurse Navigator Flowsheets 12/08/2020  Abnormal Finding Date -  Confirmed Diagnosis Date -  Diagnosis Status -  Phase of Treatment -  Chemotherapy Actual Start Date: -  Chemotherapy Expected End Date: -  Radiation Actual Start Date: -  Radiation Actual End Date: -  Navigator Follow Up Date: 12/15/2020  Navigator Follow Up Reason: Scan Review  Navigator Location [facility]  Referral Date to RadOnc/MedOnc -  Navigator Encounter Type Telephone  Telephone Incoming Call  Treatment Initiated Date -  Patient Visit Type MedOnc  Treatment Phase Active Tx  Barriers/Navigation Needs Coordination of Care;Education  Education -  Interventions Coordination of Care  Acuity Level 2-Minimal Needs (1-2 Barriers Identified)  Coordination of Care Home Health  Education Method -  Support Groups/Services Friends and Family  Time Spent with Patient 30

## 2020-12-15 ENCOUNTER — Other Ambulatory Visit: Payer: Self-pay

## 2020-12-15 ENCOUNTER — Ambulatory Visit (HOSPITAL_COMMUNITY)
Admission: RE | Admit: 2020-12-15 | Discharge: 2020-12-15 | Disposition: A | Discharge status: Home / Self Care | Payer: Medicare Other | Source: Ambulatory Visit | Attending: Hematology & Oncology | Admitting: Hematology & Oncology

## 2020-12-15 DIAGNOSIS — C3490 Malignant neoplasm of unspecified part of unspecified bronchus or lung: Secondary | ICD-10-CM | POA: Insufficient documentation

## 2020-12-15 DIAGNOSIS — C7931 Secondary malignant neoplasm of brain: Secondary | ICD-10-CM | POA: Insufficient documentation

## 2020-12-15 MED ORDER — IOHEXOL 300 MG/ML  SOLN
75.0000 mL | Freq: Once | INTRAMUSCULAR | Status: AC | PRN
  Administered 2020-12-15: 75 mL via INTRAVENOUS

## 2020-12-17 ENCOUNTER — Inpatient Hospital Stay (HOSPITAL_BASED_OUTPATIENT_CLINIC_OR_DEPARTMENT_OTHER): Payer: Medicare Other | Admitting: Hematology & Oncology

## 2020-12-17 ENCOUNTER — Inpatient Hospital Stay: Payer: Medicare Other

## 2020-12-17 ENCOUNTER — Inpatient Hospital Stay: Payer: Medicare Other | Attending: Hematology & Oncology

## 2020-12-17 ENCOUNTER — Other Ambulatory Visit: Payer: Self-pay

## 2020-12-17 ENCOUNTER — Encounter: Payer: Self-pay | Admitting: Hematology & Oncology

## 2020-12-17 ENCOUNTER — Telehealth: Payer: Self-pay | Admitting: *Deleted

## 2020-12-17 VITALS — BP 92/50 | HR 84 | Temp 97.8°F | Resp 17

## 2020-12-17 DIAGNOSIS — Z993 Dependence on wheelchair: Secondary | ICD-10-CM | POA: Diagnosis not present

## 2020-12-17 DIAGNOSIS — C7931 Secondary malignant neoplasm of brain: Secondary | ICD-10-CM | POA: Diagnosis not present

## 2020-12-17 DIAGNOSIS — C3490 Malignant neoplasm of unspecified part of unspecified bronchus or lung: Secondary | ICD-10-CM | POA: Diagnosis not present

## 2020-12-17 DIAGNOSIS — C3411 Malignant neoplasm of upper lobe, right bronchus or lung: Secondary | ICD-10-CM | POA: Insufficient documentation

## 2020-12-17 DIAGNOSIS — Z79899 Other long term (current) drug therapy: Secondary | ICD-10-CM | POA: Diagnosis not present

## 2020-12-17 DIAGNOSIS — Z66 Do not resuscitate: Secondary | ICD-10-CM | POA: Insufficient documentation

## 2020-12-17 LAB — CBC WITH DIFFERENTIAL (CANCER CENTER ONLY)
Abs Immature Granulocytes: 0.56 10*3/uL — ABNORMAL HIGH (ref 0.00–0.07)
Basophils Absolute: 0.1 10*3/uL (ref 0.0–0.1)
Basophils Relative: 0 %
Eosinophils Absolute: 0 10*3/uL (ref 0.0–0.5)
Eosinophils Relative: 0 %
HCT: 35 % — ABNORMAL LOW (ref 39.0–52.0)
Hemoglobin: 11.7 g/dL — ABNORMAL LOW (ref 13.0–17.0)
Immature Granulocytes: 3 %
Lymphocytes Relative: 9 %
Lymphs Abs: 1.8 10*3/uL (ref 0.7–4.0)
MCH: 29.8 pg (ref 26.0–34.0)
MCHC: 33.4 g/dL (ref 30.0–36.0)
MCV: 89.1 fL (ref 80.0–100.0)
Monocytes Absolute: 1.1 10*3/uL — ABNORMAL HIGH (ref 0.1–1.0)
Monocytes Relative: 5 %
Neutro Abs: 17.5 10*3/uL — ABNORMAL HIGH (ref 1.7–7.7)
Neutrophils Relative %: 83 %
Platelet Count: 348 10*3/uL (ref 150–400)
RBC: 3.93 MIL/uL — ABNORMAL LOW (ref 4.22–5.81)
RDW: 15 % (ref 11.5–15.5)
WBC Count: 21.1 10*3/uL — ABNORMAL HIGH (ref 4.0–10.5)
nRBC: 0 % (ref 0.0–0.2)

## 2020-12-17 LAB — CMP (CANCER CENTER ONLY)
ALT: 27 U/L (ref 0–44)
AST: 13 U/L — ABNORMAL LOW (ref 15–41)
Albumin: 3 g/dL — ABNORMAL LOW (ref 3.5–5.0)
Alkaline Phosphatase: 85 U/L (ref 38–126)
Anion gap: 9 (ref 5–15)
BUN: 26 mg/dL — ABNORMAL HIGH (ref 8–23)
CO2: 26 mmol/L (ref 22–32)
Calcium: 8.8 mg/dL — ABNORMAL LOW (ref 8.9–10.3)
Chloride: 96 mmol/L — ABNORMAL LOW (ref 98–111)
Creatinine: 0.65 mg/dL (ref 0.61–1.24)
GFR, Estimated: >60 mL/min (ref >60)
Glucose, Bld: 379 mg/dL — ABNORMAL HIGH (ref 70–99)
Potassium: 4.6 mmol/L (ref 3.5–5.1)
Sodium: 131 mmol/L — ABNORMAL LOW (ref 135–145)
Total Bilirubin: 0.5 mg/dL (ref 0.3–1.2)
Total Protein: 6.2 g/dL — ABNORMAL LOW (ref 6.5–8.1)

## 2020-12-17 LAB — LACTATE DEHYDROGENASE: LDH: 264 U/L — ABNORMAL HIGH (ref 98–192)

## 2020-12-17 LAB — TSH: TSH: 1.05 u[IU]/mL (ref 0.320–4.118)

## 2020-12-17 NOTE — Telephone Encounter (Signed)
Hospice referral made to [facility].  [phone].  Patient already in palliative care so this will be switched over this weekend.  Nurse will call patient family today or tomorrow.

## 2020-12-17 NOTE — Progress Notes (Signed)
Hematology and Oncology Follow Up Visit  Bryce Powell [id number] 1940-12-30 80 y.o. 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  Unfortunately, we just were not making much headway with respect to his overall performance status.  He still comes in a wheelchair.  He is having problems with bowels and bladder.  He is really not continent.  I suspect a lot of the urinary issues because his blood sugars are horrible.  His blood sugar today is 380.  I told his wife to increase his Lantus to 35 units daily.  I told his wife to increase his Humalog to 15 units 3 times a day.  He is not doing well at home with respect to his mental state.  His wife says he is combative.  I think a lot of what is going on is because of what was in his brain.  Did do a CT scan on him today.  Unfortunately, the CT scan showed that he was progressing.  He just is not a candidate for any systemic chemotherapy.  Given that fact, I really think that we have to get Hospice for him.  I talked to he and his family about Hospice.  I explained what they do.  I really believe that Hospice will be vital for him and his quality of life.  His wife and daughter both agree to hospice.  I did talk to him about end-of-life issues.  I think he understood these.  Again, his wife and daughter are certainly much more aware of the situation.  They do not want him to be kept alive on machines if he were to decline.  I totally agree with this.  I think we kept alive artificially, this would definitely not improve his quality of life.  It would certainly allow his cancer to continue to grow.  I think that if he went on life support, he would not come off.  As such, he is  a DO NOT RESUSCITATE.  This I totally agree with.  It is hard to say how much he is eating.  We cannot really decrease his Decadron because if we do try, he will have more mental issues.  He does not seem to be hurting.  There is no bleeding.  He has had no cough.  Overall, I would have to say his performance status is probably ECOG 3, at best.    Medications:  Current Outpatient Medications:    acetaminophen (TYLENOL) 500 MG tablet, Take 2 tablets (1,000 mg total) by mouth every 6 (six) hours. (Patient taking differently: Take 500 mg by mouth every 6 (six) hours as needed for moderate pain.), Disp: 30 tablet, Rfl: 0   allopurinol (ZYLOPRIM) 300 MG tablet, Take 1 tablet (300 mg total) by mouth daily. (Patient not taking: No sig reported), Disp: 30 tablet, Rfl: 5   amLODipine (NORVASC) 5 MG tablet, Take 5 mg by mouth daily. (Patient not taking: Reported on 12/03/2020), Disp: , Rfl:    dexamethasone (DECADRON) 4 MG tablet, Take 1 tablet (4 mg total) by mouth 3 (three) times daily. (Patient taking differently: Take 4 mg by mouth daily.), Disp: 90 tablet, Rfl: 2   esomeprazole (NEXIUM) 20 MG capsule, Take 20 mg by  mouth daily. (Patient not taking: Reported on 12/03/2020), Disp: , Rfl:    glipiZIDE (GLUCOTROL XL) 10 MG 24 hr tablet, Take 10 mg by mouth daily. (Patient not taking: Reported on 12/03/2020), Disp: , Rfl:    HYDROcodone-acetaminophen (NORCO/VICODIN) 5-325 MG tablet, Take 1 tablet by mouth 2 (two) times daily as needed. (Patient not taking: Reported on 12/03/2020), Disp: , Rfl:    insulin glargine (LANTUS) 100 UNIT/ML Solostar Pen, Inject 14 Units into the skin daily. (Patient taking differently: Inject 26 Units into the skin daily.), Disp: 15 mL, Rfl: 0   insulin lispro (HUMALOG KWIKPEN) 100 UNIT/ML KwikPen, Inject 5 Units into the skin 3 (three) times daily. (Patient taking differently: Inject 10 Units into the skin 3 (three) times daily.), Disp: 15 mL, Rfl: 0   levETIRAcetam (KEPPRA) 500 MG  tablet, Take 1 tablet (500 mg total) by mouth 2 (two) times daily., Disp: 60 tablet, Rfl: 0   losartan (COZAAR) 50 MG tablet, Take 25 mg by mouth daily., Disp: , Rfl:    metFORMIN (GLUCOPHAGE) 1000 MG tablet, Take 1,000 mg by mouth 2 (two) times daily. (Patient not taking: Reported on 12/03/2020), Disp: , Rfl:    Multiple Vitamin (MULTIVITAMIN WITH MINERALS) TABS tablet, Take 1 tablet by mouth daily., Disp: , Rfl:    nicotine (NICODERM CQ - DOSED IN MG/24 HOURS) 21 mg/24hr patch, Place 1 patch (21 mg total) onto the skin daily. (Patient not taking: Reported on 12/03/2020), Disp: 28 patch, Rfl: 1   nitroGLYCERIN (NITROSTAT) 0.4 MG SL tablet, Place 1 tablet (0.4 mg total) under the tongue every 5 (five) minutes as needed for chest pain. (Patient not taking: Reported on 12/03/2020), Disp: 25 tablet, Rfl: 3   RELION PEN NEEDLE 31G/8MM 31G X 8 MM MISC, USE AS DIRECTED FIVE TIMES DAILY, Disp: , Rfl:    rosuvastatin (CRESTOR) 5 MG tablet, TAKE 1 TABLET BY MOUTH  DAILY (Patient not taking: No sig reported), Disp: 90 tablet, Rfl: 1   traMADol (ULTRAM) 50 MG tablet, Take 50 mg by mouth every 8 (eight) hours as needed. (Patient not taking: Reported on 12/03/2020), Disp: , Rfl:  No current facility-administered medications for this visit.  Facility-Administered Medications Ordered in Other Visits:    famotidine (PEPCID) IVPB 20 mg in NS 100 mL IVPB, 20 mg, Intravenous, Q12H, Anaiyah Anglemyer, Rudell Cobb, MD, Last Rate: 200 mL/hr at 11/23/20 1429, Infusion Verify at 11/23/20 1429   sodium chloride flush (NS) 0.9 % injection 10 mL, 10 mL, Intracatheter, PRN, Volanda Napoleon, MD, 10 mL at 11/23/20 1315  Allergies:  Allergies  Allergen Reactions   Atorvastatin     MUSCLE ACHES     Past Medical History, Surgical history, Social history, and Family History were reviewed and updated.  Review of Systems: Review of Systems  Constitutional: Negative.   HENT:  Negative.    Eyes: Negative.   Respiratory: Negative.     Cardiovascular: Negative.   Gastrointestinal: Negative.   Endocrine: Negative.   Genitourinary: Negative.    Musculoskeletal: Negative.   Skin: Negative.   Neurological: Negative.   Hematological: Negative.   Psychiatric/Behavioral: Negative.     Physical Exam:  oral temperature is 97.8 F (36.6 C). His blood pressure is 92/50 (abnormal) and his pulse is 84. His respiration is 17 and oxygen saturation is 98%.   Wt Readings from Last 3 Encounters:  12/03/20 200 lb (90.7 kg)  10/10/20 200 lb (90.7 kg)  09/29/20 194 lb (88 kg)    Physical Exam Vitals  reviewed.  HENT:     Head: Normocephalic and atraumatic.  Eyes:     Pupils: Pupils are equal, round, and reactive to light.  Cardiovascular:     Rate and Rhythm: Normal rate and regular rhythm.     Heart sounds: Normal heart sounds.  Pulmonary:     Effort: Pulmonary effort is normal.     Breath sounds: Normal breath sounds.  Abdominal:     General: Bowel sounds are normal.     Palpations: Abdomen is soft.  Musculoskeletal:        General: No tenderness or deformity. Normal range of motion.     Cervical back: Normal range of motion.  Lymphadenopathy:     Cervical: No cervical adenopathy.  Skin:    General: Skin is warm and dry.     Findings: No erythema or rash.  Neurological:     Mental Status: He is alert and oriented to person, place, and time.  Psychiatric:        Behavior: Behavior normal.        Thought Content: Thought content normal.        Judgment: Judgment normal.    Lab Results  Component Value Date   WBC 21.1 (H) 12/17/2020   HGB 11.7 (L) 12/17/2020   HCT 35.0 (L) 12/17/2020   MCV 89.1 12/17/2020   PLT 348 12/17/2020     Chemistry      Component Value Date/Time   NA 131 (L) 12/17/2020 1010   NA 139 04/27/2017 0857   K 4.6 12/17/2020 1010   CL 96 (L) 12/17/2020 1010   CO2 26 12/17/2020 1010   BUN 26 (H) 12/17/2020 1010   BUN 24 04/27/2017 0857   CREATININE 0.65 12/17/2020 1010       Component Value Date/Time   CALCIUM 8.8 (L) 12/17/2020 1010   ALKPHOS 85 12/17/2020 1010   AST 13 (L) 12/17/2020 1010   ALT 27 12/17/2020 1010   BILITOT 0.5 12/17/2020 1010      Impression and Plan: Bryce Powell is a very nice 80 year old white male.  He has metastatic bronchogenic carcinoma.  This is a squamous cell cancer.  He underwent radiosurgery for the CNS metastasis.  He has had 3 cycles of immunotherapy.  Again, he is not responding.  He just is not a candidate for any systemic chemotherapy at this point.  Again, we call Hospice.  They will call his family and come out to see him and see what can be done to try to help his quality of life.  As far as prognosis, it is hard to say what timeframe where we are looking at.  It does not like he has a lot of disease.  He does has disease in a bad place.  Again I just want quality of life to be the focus.  I just wish that his state of mind would get better.  I would like to see him back in the future.  I just want to make sure that we are doing okay.  I be nice to see how he feels and hopefully see if he is any better with respect to his state of mind and his urinary/bowel issues.  I just feel for his wife and daughter.  They have done a whole lot for him.  They really have done a great job trying to keep him going and trying to improve his quality of life.  I give them full credit for all they have done for  him.   Volanda Napoleon, MD 8/5/202211:52 AM

## 2020-12-17 NOTE — Patient Instructions (Signed)
Implanted Port Home Guide An implanted port is a device that is placed under the skin. It is usually placed in the chest. The device can be used to give IV medicine, to take blood, or for dialysis. You may have an implanted port if: You need IV medicine that would be irritating to the small veins in your hands or arms. You need IV medicines, such as antibiotics, for a long period of time. You need IV nutrition for a long period of time. You need dialysis. When you have a port, your health care provider can choose to use the port instead of veins in your arms for these procedures. You may have fewer limitations when using a port than you would if you used other types of long-term IVs, and you will likely be able to return to normal activities afteryour incision heals. An implanted port has two main parts: Reservoir. The reservoir is the part where a needle is inserted to give medicines or draw blood. The reservoir is round. After it is placed, it appears as a small, raised area under your skin. Catheter. The catheter is a thin, flexible tube that connects the reservoir to a vein. Medicine that is inserted into the reservoir goes into the catheter and then into the vein. How is my port accessed? To access your port: A numbing cream may be placed on the skin over the port site. Your health care provider will put on a mask and sterile gloves. The skin over your port will be cleaned carefully with a germ-killing soap and allowed to dry. Your health care provider will gently pinch the port and insert a needle into it. Your health care provider will check for a blood return to make sure the port is in the vein and is not clogged. If your port needs to remain accessed to get medicine continuously (constant infusion), your health care provider will place a clear bandage (dressing) over the needle site. The dressing and needle will need to be changed every week, or as told by your health care provider. What  is flushing? Flushing helps keep the port from getting clogged. Follow instructions from your health care provider about how and when to flush the port. Ports are usually flushed with saline solution or a medicine called heparin. The need for flushing will depend on how the port is used: If the port is only used from time to time to give medicines or draw blood, the port may need to be flushed: Before and after medicines have been given. Before and after blood has been drawn. As part of routine maintenance. Flushing may be recommended every 4-6 weeks. If a constant infusion is running, the port may not need to be flushed. Throw away any syringes in a disposal container that is meant for sharp items (sharps container). You can buy a sharps container from a pharmacy, or you can make one by using an empty hard plastic bottle with a cover. How long will my port stay implanted? The port can stay in for as long as your health care provider thinks it is needed. When it is time for the port to come out, a surgery will be done to remove it. The surgery will be similar to the procedure that was done to putthe port in. Follow these instructions at home:  Flush your port as told by your health care provider. If you need an infusion over several days, follow instructions from your health care provider about how to take   care of your port site. Make sure you: Wash your hands with soap and water before you change your dressing. If soap and water are not available, use alcohol-based hand sanitizer. Change your dressing as told by your health care provider. Place any used dressings or infusion bags into a plastic bag. Throw that bag in the trash. Keep the dressing that covers the needle clean and dry. Do not get it wet. Do not use scissors or sharp objects near the tube. Keep the tube clamped, unless it is being used. Check your port site every day for signs of infection. Check for: Redness, swelling, or  pain. Fluid or blood. Pus or a bad smell. Protect the skin around the port site. Avoid wearing bra straps that rub or irritate the site. Protect the skin around your port from seat belts. Place a soft pad over your chest if needed. Bathe or shower as told by your health care provider. The site may get wet as long as you are not actively receiving an infusion. Return to your normal activities as told by your health care provider. Ask your health care provider what activities are safe for you. Carry a medical alert card or wear a medical alert bracelet at all times. This will let health care providers know that you have an implanted port in case of an emergency. Get help right away if: You have redness, swelling, or pain at the port site. You have fluid or blood coming from your port site. You have pus or a bad smell coming from the port site. You have a fever. Summary Implanted ports are usually placed in the chest for long-term IV access. Follow instructions from your health care provider about flushing the port and changing bandages (dressings). Take care of the area around your port by avoiding clothing that puts pressure on the area, and by watching for signs of infection. Protect the skin around your port from seat belts. Place a soft pad over your chest if needed. Get help right away if you have a fever or you have redness, swelling, pain, drainage, or a bad smell at the port site. This information is not intended to replace advice given to you by your health care provider. Make sure you discuss any questions you have with your healthcare provider. Document Revised: 09/15/2019 Document Reviewed: 09/15/2019 Elsevier Patient Education  2022 Elsevier Inc.  

## 2020-12-18 LAB — T4: T4, Total: 6 ug/dL (ref 4.5–12.0)

## 2020-12-21 ENCOUNTER — Encounter: Payer: Self-pay | Admitting: *Deleted

## 2020-12-21 NOTE — Progress Notes (Signed)
Patient has progressed through therapy and his performance status has continued to decline. He is now enrolled in hospice.   Oncology Nurse Navigator Documentation  Oncology Nurse Navigator Flowsheets 12/21/2020  Abnormal Finding Date -  Confirmed Diagnosis Date -  Diagnosis Status -  Phase of Treatment -  Chemotherapy Actual Start Date: -  Chemotherapy Expected End Date: -  Radiation Actual Start Date: -  Radiation Actual End Date: -  Navigator Follow Up Date: -  Navigator Follow Up Reason: -  Navigation Complete Date: 12/21/2020  Post Navigation: Continue to Follow Patient? No  Reason Not Navigating Patient: Hospice/Death  Navigator Location [facility]  Referral Date to RadOnc/MedOnc -  Navigator Encounter Type Scan Review;Appt/Treatment Plan Review  Telephone -  Treatment Initiated Date -  Patient Visit Type MedOnc  Treatment Phase Other  Barriers/Navigation Needs Coordination of Care;Education  Education -  Interventions None Required  Acuity Level 2-Minimal Needs (1-2 Barriers Identified)  Coordination of Care -  Education Method -  Support Groups/Services Friends and Family  Time Spent with Patient 15

## 2021-01-22 ENCOUNTER — Encounter: Payer: Self-pay | Admitting: Hematology & Oncology

## 2021-01-23 ENCOUNTER — Encounter: Payer: Self-pay | Admitting: Hematology & Oncology

## 2021-01-24 ENCOUNTER — Encounter: Payer: Self-pay | Admitting: Hematology & Oncology

## 2021-01-25 ENCOUNTER — Encounter: Payer: Self-pay | Admitting: Hematology & Oncology

## 2021-01-26 ENCOUNTER — Encounter: Payer: Self-pay | Admitting: Hematology & Oncology

## 2021-01-28 ENCOUNTER — Ambulatory Visit: Payer: Medicare Other | Admitting: Hematology & Oncology

## 2021-01-28 ENCOUNTER — Other Ambulatory Visit: Payer: Medicare Other

## 2021-01-28 ENCOUNTER — Other Ambulatory Visit (HOSPITAL_COMMUNITY): Payer: Self-pay

## 2021-02-14 ENCOUNTER — Telehealth: Payer: Self-pay | Admitting: *Deleted

## 2021-02-14 NOTE — Telephone Encounter (Signed)
Email sent to patient family for flowers per Dr Marin Olp request upon hearing of patients passing.

## 2021-02-14 NOTE — Telephone Encounter (Signed)
Received call from Caryl Pina, [place], from [facility], that patient passed away on 03/09/2023 at 2:40pm. Message given to MD.

## 2021-03-15 DEATH — deceased
